# Patient Record
Sex: Male | Born: 1945 | Race: White | Hispanic: No | Marital: Married | Smoking: Former smoker
Health system: Southern US, Community
[De-identification: ages and names within clinical notes are randomized; demographics above are authoritative.]

## PROBLEM LIST (undated history)

## (undated) DIAGNOSIS — K8309 Other cholangitis: Secondary | ICD-10-CM

## (undated) DIAGNOSIS — I10 Essential (primary) hypertension: Secondary | ICD-10-CM

## (undated) DIAGNOSIS — D696 Thrombocytopenia, unspecified: Secondary | ICD-10-CM

## (undated) DIAGNOSIS — K8050 Calculus of bile duct without cholangitis or cholecystitis without obstruction: Secondary | ICD-10-CM

## (undated) DIAGNOSIS — I499 Cardiac arrhythmia, unspecified: Secondary | ICD-10-CM

## (undated) DIAGNOSIS — K2970 Gastritis, unspecified, without bleeding: Secondary | ICD-10-CM

## (undated) DIAGNOSIS — K269 Duodenal ulcer, unspecified as acute or chronic, without hemorrhage or perforation: Secondary | ICD-10-CM

## (undated) DIAGNOSIS — F1010 Alcohol abuse, uncomplicated: Secondary | ICD-10-CM

## (undated) DIAGNOSIS — I2510 Atherosclerotic heart disease of native coronary artery without angina pectoris: Secondary | ICD-10-CM

## (undated) DIAGNOSIS — E7800 Pure hypercholesterolemia, unspecified: Secondary | ICD-10-CM

## (undated) HISTORY — DX: Atherosclerotic heart disease of native coronary artery without angina pectoris: I25.10

## (undated) HISTORY — DX: Pure hypercholesterolemia, unspecified: E78.00

## (undated) HISTORY — DX: Gastritis, unspecified, without bleeding: K29.70

## (undated) HISTORY — DX: Alcohol abuse, uncomplicated: F10.10

## (undated) HISTORY — DX: Thrombocytopenia, unspecified: D69.6

## (undated) HISTORY — DX: Other cholangitis: K83.09

## (undated) HISTORY — DX: Duodenal ulcer, unspecified as acute or chronic, without hemorrhage or perforation: K26.9

## (undated) HISTORY — DX: Cardiac arrhythmia, unspecified: I49.9

## (undated) HISTORY — PX: TONSILLECTOMY: SUR1361

## (undated) HISTORY — PX: EYE SURGERY: SHX253

## (undated) HISTORY — PX: APPENDECTOMY: SHX54

## (undated) HISTORY — DX: Calculus of bile duct without cholangitis or cholecystitis without obstruction: K80.50

---

## 2005-04-09 ENCOUNTER — Ambulatory Visit (HOSPITAL_COMMUNITY): Admission: RE | Admit: 2005-04-09 | Discharge: 2005-04-09 | Payer: Self-pay | Admitting: Gastroenterology

## 2012-01-13 ENCOUNTER — Other Ambulatory Visit: Payer: Self-pay | Admitting: Gastroenterology

## 2013-01-06 ENCOUNTER — Emergency Department (HOSPITAL_COMMUNITY)
Admission: EM | Admit: 2013-01-06 | Discharge: 2013-01-06 | Disposition: A | Discharge status: Home / Self Care | Payer: BC Managed Care – PPO | Attending: Emergency Medicine | Admitting: Emergency Medicine

## 2013-01-06 ENCOUNTER — Encounter (HOSPITAL_COMMUNITY): Payer: Self-pay | Admitting: Emergency Medicine

## 2013-01-06 ENCOUNTER — Emergency Department (HOSPITAL_COMMUNITY): Payer: BC Managed Care – PPO

## 2013-01-06 DIAGNOSIS — Z87891 Personal history of nicotine dependence: Secondary | ICD-10-CM | POA: Insufficient documentation

## 2013-01-06 DIAGNOSIS — R55 Syncope and collapse: Secondary | ICD-10-CM | POA: Insufficient documentation

## 2013-01-06 DIAGNOSIS — R42 Dizziness and giddiness: Secondary | ICD-10-CM | POA: Insufficient documentation

## 2013-01-06 DIAGNOSIS — Z79899 Other long term (current) drug therapy: Secondary | ICD-10-CM | POA: Insufficient documentation

## 2013-01-06 DIAGNOSIS — R110 Nausea: Secondary | ICD-10-CM | POA: Insufficient documentation

## 2013-01-06 LAB — COMPREHENSIVE METABOLIC PANEL
AST: 15 U/L (ref 0–37)
Albumin: 3.8 g/dL (ref 3.5–5.2)
Calcium: 9.2 mg/dL (ref 8.4–10.5)
Creatinine, Ser: 1.01 mg/dL (ref 0.50–1.35)

## 2013-01-06 LAB — CBC
Hemoglobin: 15.3 g/dL (ref 13.0–17.0)
MCH: 32.1 pg (ref 26.0–34.0)
MCV: 94.7 fL (ref 78.0–100.0)
RBC: 4.76 MIL/uL (ref 4.22–5.81)
WBC: 6.5 10*3/uL (ref 4.0–10.5)

## 2013-01-06 LAB — GLUCOSE, CAPILLARY: Glucose-Capillary: 108 mg/dL — ABNORMAL HIGH (ref 70–99)

## 2013-01-06 NOTE — ED Notes (Addendum)
Pt here via ems was at MD office for f/u on eye Sx for cataract removal in his lt eye while sitting in the chair pt had a syncopal episode with drop in bp pt stated that he was disoriented after awaking he stated it went black and they used HN3 to wake him up

## 2013-01-06 NOTE — ED Provider Notes (Signed)
History     CSN: [id number]  Arrival date & time 01/06/13  0944   First MD Initiated Contact with Patient 01/06/13 1011      Chief Complaint  Patient presents with  . Loss of Consciousness    (Consider location/radiation/quality/duration/timing/severity/associated sxs/prior treatment) HPI The patient presents after a syncopal events.  Today, just prior to arrival the patient was at his ophthalmologist's for a postoperative visit.  Yesterday he had an elective repair of a cataract of his left eye.  Per his report the procedure was well tolerated, with no complications.  He did require additional anesthetic during the procedure. Today, at a followup visit, the patient had a period of lightheadedness, nausea, and subsequently lost consciousness.  Upon awakening he had no headache, chest pain, dyspnea.  He has full recall of the event upon awakening.  He denies any pre-syncope chest pain, dyspnea, headache.  On my examination he denies any of these complaints well.  He notes no similar prior events. History reviewed. No pertinent past medical history.  Past Surgical History  Procedure Laterality Date  . Eye surgery      History reviewed. No pertinent family history.  History  Substance Use Topics  . Smoking status: Former Games developer  . Smokeless tobacco: Not on file  . Alcohol Use: No      Review of Systems  Constitutional:       Per HPI, otherwise negative  HENT:       Per HPI, otherwise negative  Eyes:       HPI - no other new concerns  Respiratory:       Per HPI, otherwise negative  Cardiovascular:       Per HPI, otherwise negative  Gastrointestinal: Negative for vomiting.  Endocrine:       Negative aside from HPI  Genitourinary:       Neg aside from HPI   Musculoskeletal:       Per HPI, otherwise negative  Skin: Negative.   Neurological: Positive for syncope and light-headedness. Negative for dizziness, tremors, seizures, facial asymmetry, speech difficulty,  weakness, numbness and headaches.    Allergies  Review of patient's allergies indicates no known allergies.  Home Medications   Current Outpatient Rx  Name  Route  Sig  Dispense  Refill  . ciprofloxacin (CILOXAN) 0.3 % ophthalmic solution   Ophthalmic   Apply 1 drop to eye 4 (four) times daily. Instill 1 drop into surgery eye four times daily         . prednisoLONE acetate (PRED FORTE) 1 % ophthalmic suspension   Ophthalmic   Apply 1 drop to eye 4 (four) times daily. Instill into affected eye as directed for cataract surgery           BP 117/59  Pulse 63  Ht 5\' 11"  (1.803 m)  Wt 225 lb (102.059 kg)  BMI 31.39 kg/m2  SpO2 100%  Physical Exam  Nursing note and vitals reviewed. Constitutional: He is oriented to person, place, and time. He appears well-developed. No distress.  HENT:  Head: Normocephalic and atraumatic.  Nose: Nose normal.  Mouth/Throat: Mucous membranes are normal.  Eyes: Conjunctivae and EOM are normal.  Changes about the L eye c/w post-surgical effects are apparent - though no bleeding, no discharge, no hyphema  Cardiovascular: Normal rate and regular rhythm.   Pulmonary/Chest: Effort normal. No stridor. No respiratory distress.  Abdominal: He exhibits no distension.  Musculoskeletal: He exhibits no edema.  Neurological: He is alert and oriented  to person, place, and time. No cranial nerve deficit. He exhibits normal muscle tone. Coordination normal.  Skin: Skin is warm and dry.  Psychiatric: He has a normal mood and affect.    ED Course  Procedures (including critical care time)  Labs Reviewed  GLUCOSE, CAPILLARY - Abnormal; Notable for the following:    Glucose-Capillary 108 (*)    All other components within normal limits  CBC  COMPREHENSIVE METABOLIC PANEL  TROPONIN I   No results found.   No diagnosis found.  Cardiac 65 sinus rhythm normal Pulse ox 100% room air normal  Date: 01/06/2013  Rate: 66  Rhythm: normal sinus rhythm   QRS Axis: left  Intervals: normal  ST/T Wave abnormalities: nonspecific T wave changes  Conduction Disutrbances:none  Narrative Interpretation:   Old EKG Reviewed: none available BORDERLINE  12:13 PM On repeat exam the patient is using his eye pad, clearly in no distress.  The patient can ambulate, has no associated lightheadedness, or any other new concerns.  He and his wife were informed of all results.  They're also informed of the need for close followup. MDM  This patient, generally well, presents after a prodromal episode of syncope, with no associated chest pain, headache either before or after the event.  Given the patient's ongoing ophthalmologic procedures, and the patient just prior to today's event, there suspicion for vagal mediated syncope.  Absent distress, ECG changes, but abnormalities, there is low suspicion for ongoing coronary ischemia, dysrhythmia.  However, the patient will require additional outpatient evaluation.  This was endorsed to him and his wife.       Gerhard Munch, MD 01/06/13 1214

## 2014-12-21 DIAGNOSIS — K8020 Calculus of gallbladder without cholecystitis without obstruction: Secondary | ICD-10-CM | POA: Insufficient documentation

## 2014-12-21 DIAGNOSIS — I719 Aortic aneurysm of unspecified site, without rupture: Secondary | ICD-10-CM | POA: Insufficient documentation

## 2014-12-21 DIAGNOSIS — K760 Fatty (change of) liver, not elsewhere classified: Secondary | ICD-10-CM | POA: Insufficient documentation

## 2015-03-07 DIAGNOSIS — Z0000 Encounter for general adult medical examination without abnormal findings: Secondary | ICD-10-CM | POA: Insufficient documentation

## 2016-05-08 DIAGNOSIS — K5790 Diverticulosis of intestine, part unspecified, without perforation or abscess without bleeding: Secondary | ICD-10-CM | POA: Insufficient documentation

## 2016-05-08 DIAGNOSIS — K625 Hemorrhage of anus and rectum: Secondary | ICD-10-CM | POA: Insufficient documentation

## 2016-05-08 DIAGNOSIS — K635 Polyp of colon: Secondary | ICD-10-CM | POA: Insufficient documentation

## 2017-02-23 DIAGNOSIS — M722 Plantar fascial fibromatosis: Secondary | ICD-10-CM | POA: Insufficient documentation

## 2017-07-20 ENCOUNTER — Inpatient Hospital Stay (HOSPITAL_COMMUNITY)
Admission: EM | Admit: 2017-07-20 | Discharge: 2017-07-25 | DRG: 444 | Disposition: A | Discharge status: Home / Self Care | Payer: BC Managed Care – PPO | Attending: Internal Medicine | Admitting: Internal Medicine

## 2017-07-20 ENCOUNTER — Encounter (HOSPITAL_COMMUNITY): Payer: Self-pay | Admitting: Emergency Medicine

## 2017-07-20 ENCOUNTER — Encounter (HOSPITAL_COMMUNITY)
Admission: EM | Disposition: A | Discharge status: Home / Self Care | Payer: Self-pay | Source: Home / Self Care | Attending: Cardiovascular Disease

## 2017-07-20 DIAGNOSIS — F1010 Alcohol abuse, uncomplicated: Secondary | ICD-10-CM | POA: Diagnosis present

## 2017-07-20 DIAGNOSIS — K831 Obstruction of bile duct: Secondary | ICD-10-CM | POA: Diagnosis not present

## 2017-07-20 DIAGNOSIS — I10 Essential (primary) hypertension: Secondary | ICD-10-CM | POA: Diagnosis present

## 2017-07-20 DIAGNOSIS — K263 Acute duodenal ulcer without hemorrhage or perforation: Secondary | ICD-10-CM

## 2017-07-20 DIAGNOSIS — I248 Other forms of acute ischemic heart disease: Secondary | ICD-10-CM | POA: Diagnosis present

## 2017-07-20 DIAGNOSIS — I200 Unstable angina: Secondary | ICD-10-CM | POA: Diagnosis not present

## 2017-07-20 DIAGNOSIS — K219 Gastro-esophageal reflux disease without esophagitis: Secondary | ICD-10-CM | POA: Diagnosis present

## 2017-07-20 DIAGNOSIS — K859 Acute pancreatitis without necrosis or infection, unspecified: Secondary | ICD-10-CM | POA: Diagnosis present

## 2017-07-20 DIAGNOSIS — R7989 Other specified abnormal findings of blood chemistry: Secondary | ICD-10-CM

## 2017-07-20 DIAGNOSIS — I2510 Atherosclerotic heart disease of native coronary artery without angina pectoris: Secondary | ICD-10-CM | POA: Diagnosis not present

## 2017-07-20 DIAGNOSIS — R945 Abnormal results of liver function studies: Secondary | ICD-10-CM

## 2017-07-20 DIAGNOSIS — Z79899 Other long term (current) drug therapy: Secondary | ICD-10-CM

## 2017-07-20 DIAGNOSIS — K269 Duodenal ulcer, unspecified as acute or chronic, without hemorrhage or perforation: Secondary | ICD-10-CM | POA: Diagnosis present

## 2017-07-20 DIAGNOSIS — K2970 Gastritis, unspecified, without bleeding: Secondary | ICD-10-CM | POA: Diagnosis present

## 2017-07-20 DIAGNOSIS — E785 Hyperlipidemia, unspecified: Secondary | ICD-10-CM | POA: Diagnosis present

## 2017-07-20 DIAGNOSIS — I2121 ST elevation (STEMI) myocardial infarction involving left circumflex coronary artery: Secondary | ICD-10-CM | POA: Diagnosis present

## 2017-07-20 DIAGNOSIS — K8031 Calculus of bile duct with cholangitis, unspecified, with obstruction: Secondary | ICD-10-CM | POA: Diagnosis present

## 2017-07-20 DIAGNOSIS — R1013 Epigastric pain: Secondary | ICD-10-CM | POA: Diagnosis present

## 2017-07-20 DIAGNOSIS — K8070 Calculus of gallbladder and bile duct without cholecystitis without obstruction: Principal | ICD-10-CM | POA: Diagnosis present

## 2017-07-20 DIAGNOSIS — D696 Thrombocytopenia, unspecified: Secondary | ICD-10-CM | POA: Diagnosis present

## 2017-07-20 DIAGNOSIS — E860 Dehydration: Secondary | ICD-10-CM | POA: Diagnosis present

## 2017-07-20 DIAGNOSIS — G8929 Other chronic pain: Secondary | ICD-10-CM | POA: Diagnosis present

## 2017-07-20 DIAGNOSIS — Z87891 Personal history of nicotine dependence: Secondary | ICD-10-CM | POA: Diagnosis not present

## 2017-07-20 DIAGNOSIS — I447 Left bundle-branch block, unspecified: Secondary | ICD-10-CM | POA: Diagnosis present

## 2017-07-20 DIAGNOSIS — R509 Fever, unspecified: Secondary | ICD-10-CM | POA: Diagnosis not present

## 2017-07-20 DIAGNOSIS — R9431 Abnormal electrocardiogram [ECG] [EKG]: Secondary | ICD-10-CM

## 2017-07-20 DIAGNOSIS — I25110 Atherosclerotic heart disease of native coronary artery with unstable angina pectoris: Secondary | ICD-10-CM | POA: Diagnosis present

## 2017-07-20 DIAGNOSIS — B179 Acute viral hepatitis, unspecified: Secondary | ICD-10-CM | POA: Diagnosis present

## 2017-07-20 HISTORY — DX: Essential (primary) hypertension: I10

## 2017-07-20 HISTORY — PX: LEFT HEART CATH AND CORONARY ANGIOGRAPHY: CATH118249

## 2017-07-20 LAB — LIPID PANEL
CHOL/HDL RATIO: 2.8 ratio
CHOLESTEROL: 202 mg/dL — AB (ref 0–200)
HDL: 72 mg/dL (ref >40)
LDL Cholesterol: 114 mg/dL — ABNORMAL HIGH (ref 0–99)
Triglycerides: 80 mg/dL (ref <150)
VLDL: 16 mg/dL (ref 0–40)

## 2017-07-20 LAB — COMPREHENSIVE METABOLIC PANEL
ALBUMIN: 3.7 g/dL (ref 3.5–5.0)
ALT: 365 U/L — ABNORMAL HIGH (ref 17–63)
AST: 573 U/L — AB (ref 15–41)
Alkaline Phosphatase: 110 U/L (ref 38–126)
Anion gap: 11 (ref 5–15)
BUN: 12 mg/dL (ref 6–20)
CHLORIDE: 102 mmol/L (ref 101–111)
CO2: 21 mmol/L — ABNORMAL LOW (ref 22–32)
Calcium: 8.5 mg/dL — ABNORMAL LOW (ref 8.9–10.3)
Creatinine, Ser: 0.91 mg/dL (ref 0.61–1.24)
GFR calc Af Amer: >60 mL/min (ref >60)
Glucose, Bld: 158 mg/dL — ABNORMAL HIGH (ref 65–99)
POTASSIUM: 3.5 mmol/L (ref 3.5–5.1)
Sodium: 134 mmol/L — ABNORMAL LOW (ref 135–145)
Total Bilirubin: 3 mg/dL — ABNORMAL HIGH (ref 0.3–1.2)
Total Protein: 6.1 g/dL — ABNORMAL LOW (ref 6.5–8.1)

## 2017-07-20 LAB — CBC WITH DIFFERENTIAL/PLATELET
BASOS ABS: 0 10*3/uL (ref 0.0–0.1)
BASOS PCT: 0 %
Eosinophils Absolute: 0 10*3/uL (ref 0.0–0.7)
Eosinophils Relative: 0 %
HEMATOCRIT: 40.7 % (ref 39.0–52.0)
HEMOGLOBIN: 13.9 g/dL (ref 13.0–17.0)
Lymphocytes Relative: 9 %
Lymphs Abs: 0.6 10*3/uL — ABNORMAL LOW (ref 0.7–4.0)
MCH: 32.1 pg (ref 26.0–34.0)
MCHC: 34.2 g/dL (ref 30.0–36.0)
MCV: 94 fL (ref 78.0–100.0)
Monocytes Absolute: 0.1 10*3/uL (ref 0.1–1.0)
Monocytes Relative: 2 %
NEUTROS ABS: 6.3 10*3/uL (ref 1.7–7.7)
NEUTROS PCT: 89 %
Platelets: 145 10*3/uL — ABNORMAL LOW (ref 150–400)
RBC: 4.33 MIL/uL (ref 4.22–5.81)
RDW: 12.8 % (ref 11.5–15.5)
WBC: 7.1 10*3/uL (ref 4.0–10.5)

## 2017-07-20 LAB — PROTIME-INR
INR: 1
Prothrombin Time: 13.1 seconds (ref 11.4–15.2)

## 2017-07-20 LAB — TROPONIN I

## 2017-07-20 LAB — APTT: aPTT: 24 seconds (ref 24–36)

## 2017-07-20 LAB — LIPASE, BLOOD: LIPASE: 29 U/L (ref 11–51)

## 2017-07-20 SURGERY — LEFT HEART CATH AND CORONARY ANGIOGRAPHY
Anesthesia: LOCAL

## 2017-07-20 MED ORDER — ASPIRIN 81 MG PO CHEW
81.0000 mg | CHEWABLE_TABLET | Freq: Every day | ORAL | Status: DC
  Administered 2017-07-21: 81 mg via ORAL
  Filled 2017-07-20 (×2): qty 1

## 2017-07-20 MED ORDER — HEPARIN SODIUM (PORCINE) 5000 UNIT/ML IJ SOLN
4000.0000 [IU] | Freq: Once | INTRAMUSCULAR | Status: AC
  Administered 2017-07-20: 4000 [IU] via INTRAVENOUS

## 2017-07-20 MED ORDER — SODIUM CHLORIDE 0.9 % IV SOLN
250.0000 mL | INTRAVENOUS | Status: DC | PRN
Start: 2017-07-21 — End: 2017-07-23

## 2017-07-20 MED ORDER — CLOPIDOGREL BISULFATE 300 MG PO TABS
ORAL_TABLET | ORAL | Status: DC | PRN
  Administered 2017-07-20: 600 mg via ORAL

## 2017-07-20 MED ORDER — METOPROLOL TARTRATE 12.5 MG HALF TABLET
25.0000 mg | ORAL_TABLET | Freq: Two times a day (BID) | ORAL | Status: DC
  Administered 2017-07-20 – 2017-07-22 (×5): 25 mg via ORAL
  Filled 2017-07-20 (×5): qty 2

## 2017-07-20 MED ORDER — SODIUM CHLORIDE 0.9% FLUSH
3.0000 mL | Freq: Two times a day (BID) | INTRAVENOUS | Status: DC
  Administered 2017-07-21 – 2017-07-22 (×4): 3 mL via INTRAVENOUS
  Administered 2017-07-22: 10 mL via INTRAVENOUS
  Administered 2017-07-23: 3 mL via INTRAVENOUS

## 2017-07-20 MED ORDER — LIDOCAINE HCL (PF) 1 % IJ SOLN
INTRAMUSCULAR | Status: DC | PRN
Start: 2017-07-20 — End: 2017-07-20
  Administered 2017-07-20: 5 mL

## 2017-07-20 MED ORDER — SODIUM CHLORIDE 0.9 % IV SOLN: INTRAVENOUS | Status: DC

## 2017-07-20 MED ORDER — HEPARIN (PORCINE) IN NACL 2-0.9 UNIT/ML-% IJ SOLN
INTRAMUSCULAR | Status: AC
  Filled 2017-07-20: qty 1000

## 2017-07-20 MED ORDER — ACETAMINOPHEN 325 MG PO TABS: 650.0000 mg | ORAL_TABLET | ORAL | Status: DC | PRN

## 2017-07-20 MED ORDER — ONDANSETRON HCL 4 MG/2ML IJ SOLN
4.0000 mg | Freq: Four times a day (QID) | INTRAMUSCULAR | Status: DC | PRN
  Administered 2017-07-21: 4 mg via INTRAVENOUS
  Filled 2017-07-20: qty 2

## 2017-07-20 MED ORDER — ATORVASTATIN CALCIUM 80 MG PO TABS
80.0000 mg | ORAL_TABLET | Freq: Every day | ORAL | Status: DC
  Administered 2017-07-20: 80 mg via ORAL
  Filled 2017-07-20: qty 1

## 2017-07-20 MED ORDER — IOPAMIDOL (ISOVUE-370) INJECTION 76%
INTRAVENOUS | Status: DC | PRN
  Administered 2017-07-20: 100 mL via INTRAVENOUS

## 2017-07-20 MED ORDER — VERAPAMIL HCL 2.5 MG/ML IV SOLN
INTRAVENOUS | Status: AC
  Filled 2017-07-20: qty 2

## 2017-07-20 MED ORDER — ZOLPIDEM TARTRATE 5 MG PO TABS
5.0000 mg | ORAL_TABLET | Freq: Once | ORAL | Status: AC
  Administered 2017-07-20: 5 mg via ORAL
  Filled 2017-07-20: qty 1

## 2017-07-20 MED ORDER — IBUPROFEN 200 MG PO TABS
400.0000 mg | ORAL_TABLET | Freq: Four times a day (QID) | ORAL | Status: DC | PRN
  Administered 2017-07-21: 400 mg via ORAL
  Filled 2017-07-20: qty 2

## 2017-07-20 MED ORDER — IOPAMIDOL (ISOVUE-370) INJECTION 76%
INTRAVENOUS | Status: AC
  Filled 2017-07-20: qty 100

## 2017-07-20 MED ORDER — CLOPIDOGREL BISULFATE 75 MG PO TABS
75.0000 mg | ORAL_TABLET | Freq: Every day | ORAL | Status: DC
  Administered 2017-07-21: 75 mg via ORAL
  Filled 2017-07-20: qty 1

## 2017-07-20 MED ORDER — CLOPIDOGREL BISULFATE 300 MG PO TABS
ORAL_TABLET | ORAL | Status: AC
  Filled 2017-07-20: qty 2

## 2017-07-20 MED ORDER — SODIUM CHLORIDE 0.9% FLUSH
3.0000 mL | INTRAVENOUS | Status: DC | PRN
Start: 2017-07-21 — End: 2017-07-23

## 2017-07-20 MED ORDER — LIDOCAINE HCL 2 % IJ SOLN
INTRAMUSCULAR | Status: AC
  Filled 2017-07-20: qty 10

## 2017-07-20 MED ORDER — VERAPAMIL HCL 2.5 MG/ML IV SOLN
INTRAVENOUS | Status: DC | PRN
  Administered 2017-07-20: 21:00:00 via INTRA_ARTERIAL

## 2017-07-20 SURGICAL SUPPLY — 12 items
CATH 5FR JL3.5 JR4 ANG PIG MP (CATHETERS) ×1 IMPLANT
CATH VISTA GUIDE 6FR XBLAD3.5 (CATHETERS) ×1 IMPLANT
DEVICE RAD COMP TR BAND LRG (VASCULAR PRODUCTS) ×1 IMPLANT
GLIDESHEATH SLEND SS 6F .021 (SHEATH) ×1 IMPLANT
GUIDEWIRE INQWIRE 1.5J.035X260 (WIRE) IMPLANT
INQWIRE 1.5J .035X260CM (WIRE) ×2
KIT ENCORE 26 ADVANTAGE (KITS) ×1 IMPLANT
KIT HEART LEFT (KITS) ×2 IMPLANT
PACK CARDIAC CATHETERIZATION (CUSTOM PROCEDURE TRAY) ×2 IMPLANT
SYR MEDRAD MARK V 150ML (SYRINGE) ×1 IMPLANT
TRANSDUCER W/STOPCOCK (MISCELLANEOUS) ×2 IMPLANT
TUBING CIL FLEX 10 FLL-RA (TUBING) ×2 IMPLANT

## 2017-07-20 NOTE — Consult Note (Signed)
Reason for Consult: Abnormal LFTs. Referring Physician: Dr. Angelena Form.  Dustin Clayton is an 71 y.o. male.  HPI:71 year old male with no significant past medical history presented to the ER with complaints of epigastric pain. Patient's pain started today morning 2 hours after breakfast. Pain is mostly epigastric in area than aching nonradiating had associated nausea vomiting no diarrhea. Patient had a similar episode last week which was self-limited without any intervention. Today the pain was more worse. In the ER EKG was showing LBBB and since patient's condition was concerning for acute ST elevation MI patient was taken to cardiac catheter and as per the cardiologist cardiac Is unremarkable. Labs revealed patient has elevated LFTs and patient is also febrile with temperatures around 102F. Patient denies any recent travel sick contacts or any change in medications. Patient only takes some supplements like vitamin D coenzyme Q and at times takes Flexeril for back pain. Denies any Tylenol excess. On my exam patient's abdominal pain has improved and is appearing nontender at this time.  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  Past Medical History:  Diagnosis Date  . HTN (hypertension)     Past Surgical History:  Procedure Laterality Date  . APPENDECTOMY    . EYE SURGERY    . TONSILLECTOMY      Family History  Problem Relation Age of Onset  . Prostate cancer Brother   . CAD Neg Hx     Social History:  reports that he quit smoking about 32 years ago. He has never used smokeless tobacco. He reports that he drinks about 8.4 oz of alcohol per week . He reports that he does not use drugs.  Allergies: No Known Allergies  Medications: Reviewed.   Results for orders placed or performed during the hospital encounter of 07/20/17 (from the past 48 hour(s))  CBC  with Differential/Platelet     Status: Abnormal   Collection Time: 07/20/17  8:15 PM  Result Value Ref Range   WBC 7.1 4.0 - 10.5 K/uL   RBC 4.33 4.22 - 5.81 MIL/uL   Hemoglobin 13.9 13.0 - 17.0 g/dL   HCT 40.7 39.0 - 52.0 %   MCV 94.0 78.0 - 100.0 fL   MCH 32.1 26.0 - 34.0 pg   MCHC 34.2 30.0 - 36.0 g/dL   RDW 12.8 11.5 - 15.5 %   Platelets 145 (L) 150 - 400 K/uL   Neutrophils Relative % 89 %   Neutro Abs 6.3 1.7 - 7.7 K/uL   Lymphocytes Relative 9 %   Lymphs Abs 0.6 (L) 0.7 - 4.0 K/uL   Monocytes Relative 2 %   Monocytes Absolute 0.1 0.1 - 1.0 K/uL   Eosinophils Relative 0 %   Eosinophils Absolute 0.0 0.0 - 0.7 K/uL   Basophils Relative 0 %   Basophils Absolute 0.0 0.0 - 0.1 K/uL  Protime-INR     Status: None   Collection Time: 07/20/17  8:15 PM  Result Value Ref Range   Prothrombin Time 13.1 11.4 - 15.2 seconds   INR 1.00   APTT     Status: None   Collection Time: 07/20/17  8:15 PM  Result Value Ref Range   aPTT 24 24 - 36 seconds  Comprehensive metabolic panel     Status: Abnormal   Collection Time: 07/20/17  8:15 PM  Result Value Ref Range   Sodium 134 (L) 135 -  145 mmol/L   Potassium 3.5 3.5 - 5.1 mmol/L   Chloride 102 101 - 111 mmol/L   CO2 21 (L) 22 - 32 mmol/L   Glucose, Bld 158 (H) 65 - 99 mg/dL   BUN 12 6 - 20 mg/dL   Creatinine, Ser 0.91 0.61 - 1.24 mg/dL   Calcium 8.5 (L) 8.9 - 10.3 mg/dL   Total Protein 6.1 (L) 6.5 - 8.1 g/dL   Albumin 3.7 3.5 - 5.0 g/dL   AST 573 (H) 15 - 41 U/L   ALT 365 (H) 17 - 63 U/L   Alkaline Phosphatase 110 38 - 126 U/L   Total Bilirubin 3.0 (H) 0.3 - 1.2 mg/dL   GFR calc non Af Amer >60 >60 mL/min   GFR calc Af Amer >60 >60 mL/min    Comment: (NOTE) The eGFR has been calculated using the CKD EPI equation. This calculation has not been validated in all clinical situations. eGFR's persistently <60 mL/min signify possible Chronic Kidney Disease.    Anion gap 11 5 - 15  Troponin I     Status: None   Collection Time:  07/20/17  8:15 PM  Result Value Ref Range   Troponin I <0.03 <0.03 ng/mL  Lipid panel     Status: Abnormal   Collection Time: 07/20/17  8:15 PM  Result Value Ref Range   Cholesterol 202 (H) 0 - 200 mg/dL   Triglycerides 80 <150 mg/dL   HDL 72 >40 mg/dL   Total CHOL/HDL Ratio 2.8 RATIO   VLDL 16 0 - 40 mg/dL   LDL Cholesterol 114 (H) 0 - 99 mg/dL    Comment:        Total Cholesterol/HDL:CHD Risk Coronary Heart Disease Risk Table                     Men   Women  1/2 Average Risk   3.4   3.3  Average Risk       5.0   4.4  2 X Average Risk   9.6   7.1  3 X Average Risk  23.4   11.0        Use the calculated Patient Ratio above and the CHD Risk Table to determine the patient's CHD Risk.        ATP III CLASSIFICATION (LDL):  <100     mg/dL   Optimal  100-129  mg/dL   Near or Above                    Optimal  130-159  mg/dL   Borderline  160-189  mg/dL   High  >190     mg/dL   Very High   Lipase, blood     Status: None   Collection Time: 07/20/17  8:15 PM  Result Value Ref Range   Lipase 29 11 - 51 U/L    No results found.  Review of Systems  Constitutional: Positive for fever.  HENT: Negative.   Eyes: Negative.   Respiratory: Negative.   Cardiovascular: Negative.   Gastrointestinal: Positive for abdominal pain and vomiting.  Genitourinary: Negative.   Musculoskeletal: Negative.   Skin: Negative.   Neurological: Negative.   Endo/Heme/Allergies: Negative.   Psychiatric/Behavioral: Negative.    Blood pressure (!) 96/58, pulse 96, temperature (!) 102.5 F (39.2 C), temperature source Oral, resp. rate 10, height '5\' 11"'  (1.803 m), weight 101.7 kg (224 lb 3.3 oz), SpO2 95 %. Physical Exam  Constitutional: He is oriented  to person, place, and time. He appears well-developed and well-nourished.  HENT:  Head: Normocephalic and atraumatic.  Eyes: Pupils are equal, round, and reactive to light. Conjunctivae are normal.  Neck: Normal range of motion. Neck supple.   Cardiovascular: Normal rate and regular rhythm.   Respiratory: Effort normal and breath sounds normal. No respiratory distress. He has no wheezes.  GI: Soft. Bowel sounds are normal.  Musculoskeletal: Normal range of motion. He exhibits deformity. He exhibits no edema.  Neurological: He is alert and oriented to person, place, and time. No cranial nerve deficit. Coordination normal.  Skin: Skin is warm and dry.    Assessment/Plan -   #1. Abdominal pain with elevated LFTs and fever - given the history of gallstones primary concern is  for cholangitis/cholecystitis or any CBD stone. Since patient is febrile I have ordered blood cultures procalcitonin levels lactic acid levels and will also check acute hepatitis panel and recheck LFTs. Will get sonogram of abdomen to rule out cholecystitis and check CBD size. We'll get a CT scan of the abdomen and pelvis in the morning since patient just had cardiac cath with contrast. If procalcitonin's elevated or if patient is to remain febrile will keep patient on empiric antibiotics. In addition will check UA and chest x-ray. Since patient does not have any acute coronary event  I'm discontinuing patient's Lipitor and will also avoid Tylenol due to elevated LFTs. For fever I have placed patient on when necessary Motrin. #2. Abnormal EKG with normal cardiac cath. #3. Chronic back pain. #4. Thrombocytopenia - could be from alcohol intake or liver disease or acute infectious process. Any further worsening will be further workup. #5. Alcohol consumption - patient usually drinks of wine every night. We'll closely monitor for any withdrawals.  Thanks for involving [place] in patient's care we will follow along with you.  Rise Patience. 07/20/2017, 11:35 PM

## 2017-07-20 NOTE — ED Provider Notes (Signed)
[facility]-EMERGENCY DEPT Provider Note   CSN: [id number] Arrival date & time: 07/20/17  2008     History   Chief Complaint Chief Complaint  Patient presents with  . Code STEMI    HPI Dustin Clayton is a 71 y.o. male.  This is a 71 year old male who presents via EMS from home for a code STEMI.  Patient has been having sharp epigastric pain with nausea and vomiting for the past few days.  It worsened this evening and therefore EMS was called.  On arrival he was apparently very pale and there was concern for ST segment elevation in leads V1 through V3 with possible depression in leads V4 through V6.  His initial blood pressure on scene was 120/60, 324 mg aspirin given along with Zofran for nausea and one nitroglycerin.    The history is provided by the patient, the spouse and the EMS personnel. The history is limited by the condition of the patient.    Past Medical History:  Diagnosis Date  . HTN (hypertension)     There are no active problems to display for this patient.   Past Surgical History:  Procedure Laterality Date  . APPENDECTOMY    . EYE SURGERY         Home Medications    Prior to Admission medications   Medication Sig Start Date End Date Taking? Authorizing Provider  ciprofloxacin (CILOXAN) 0.3 % ophthalmic solution Apply 1 drop to eye 4 (four) times daily. Instill 1 drop into surgery eye four times daily    [provider]  prednisoLONE acetate (PRED FORTE) 1 % ophthalmic suspension Apply 1 drop to eye 4 (four) times daily. Instill into affected eye as directed for cataract surgery    [provider]    Family History History reviewed. No pertinent family history.  Social History Social History  Substance Use Topics  . Smoking status: Former Games developer  . Smokeless tobacco: Not on file  . Alcohol use No     Allergies   Patient has no known allergies.   Review of Systems Review of Systems  Unable to perform ROS: Acuity of condition      Physical Exam Updated Vital Signs Ht  (1.803 m)   Wt 102.1 kg (225 lb)   BMI 31.38 kg/m   Physical Exam  Constitutional: He is oriented to person, place, and time. He appears well-developed and well-nourished.  HENT:  Head: Normocephalic and atraumatic.  Eyes: Pupils are equal, round, and reactive to light. Conjunctivae are normal.  Neck: Normal range of motion. Neck supple.  Cardiovascular: Normal rate, regular rhythm, normal heart sounds and intact distal pulses.   No murmur heard. Pulses:      Radial pulses are 2+ on the right side, and 2+ on the left side.       Femoral pulses are 2+ on the right side, and 2+ on the left side.      Dorsalis pedis pulses are 2+ on the right side, and 2+ on the left side.  Pulmonary/Chest: Effort normal and breath sounds normal. No respiratory distress.  Abdominal: Soft. There is tenderness in the epigastric area.  Musculoskeletal: Normal range of motion. He exhibits no edema.  Neurological: He is alert and oriented to person, place, and time. He has normal strength. No sensory deficit.  Skin: Skin is warm and dry. Capillary refill takes less than 2 seconds. No rash noted. He is not diaphoretic. No erythema.  Psychiatric: He has a normal mood and  affect.  Nursing note and vitals reviewed.    ED Treatments / Results  Labs (all labs ordered are listed, but only abnormal results are displayed) Labs Reviewed  CBC WITH DIFFERENTIAL/PLATELET  PROTIME-INR  APTT  COMPREHENSIVE METABOLIC PANEL  TROPONIN I  LIPID PANEL  LIPASE, BLOOD    EKG  EKG Interpretation  Date/Time:  Monday July 20 2017 20:10:05 EDT Ventricular Rate:  112 PR Interval:    QRS Duration: 106 QT Interval:  331 QTC Calculation: 452 R Axis:   27 Text Interpretation:  Sinus tachycardia Repol abnrm suggests ischemia, lateral leads ST elevation, consider anterior injury code STEMI activated already Confirmed by Marily Memos [phone]) on 07/20/2017 8:17:05  PM       Radiology No results found.  Procedures Procedures (including critical care time)  Medications Ordered in ED Medications  heparin injection 4,000 Units (4,000 Units Intravenous Given 07/20/17 2017)     Initial Impression / Assessment and Plan / ED Course  I have reviewed the triage vital signs and the nursing notes.  Pertinent labs & imaging results that were available during my care of the patient were reviewed by me and considered in my medical decision making (see chart for details).     This is a 71 year old male who presents via EMS from home for a code STEMI.   On arrival patient's repeat blood pressure 119 systolic.  His oxygen saturations above 95% on room air.  He had continued epigastric pain Repeat 12-lead EKG performed demonstrates elevation in lead v1.  He has ST depression in V4 through V6 and T-wave inversion in aVL.  Cardiology presented at bedside for code STEMI.  Agreed with interpretation of EKG although not clear presentation, patient emergently taken to Cath Lab.  Heparin bolus begun.  Spouse at bedside and all questions answered.  Final Clinical Impressions(s) / ED Diagnoses   Final diagnoses:  ST elevation    New Prescriptions New Prescriptions   No medications on file     Shaune Pollack, MD 07/20/17 2021    Marily Memos, MD 07/21/17 [phone]

## 2017-07-20 NOTE — H&P (Addendum)
Patient ID: Dustin Clayton MRN: [id number] DOB/AGE: 1946/09/14 71 y.o. Admit date: 07/20/2017  Primary Care Physician: No primary care provider on file. Primary Cardiologist: New  HPI: 71 yo male with history of borderline HTN and former tobacco abuse presented to ED via [facility] with c/o epigastric pain, weakness, nausea, dyspnea. EKG with anterior ST elevation. Code STEMI called. Upon arrival, pt with minimal pain but ongoing diaphoresis and nausea. No prior cardiac issues. He takes no medications. Former smoker having stopped in 1986.   Review of systems complete and found to be negative unless listed above   Past Medical History:  Diagnosis Date  . HTN (hypertension)     Family History  Problem Relation Age of Onset  . Prostate cancer Brother   . CAD Neg Hx     Social History   Social History  . Marital status: Married    Spouse name: N/A  . Number of children: 1  . Years of education: N/A   Occupational History  . Not on file.   Social History Main Topics  . Smoking status: Former Smoker    Quit date: 11/03/1984  . Smokeless tobacco: Not on file  . Alcohol use 8.4 oz/week    14 Glasses of wine per week  . Drug use: No  . Sexual activity: Yes   Other Topics Concern  . Not on file   Social History Narrative  . No narrative on file    Past Surgical History:  Procedure Laterality Date  . APPENDECTOMY    . EYE SURGERY    . TONSILLECTOMY      No Known Allergies  Prior to Admission Meds:  Prior to Admission medications   Medication Sig Start Date End Date Taking? Authorizing Provider  ciprofloxacin (CILOXAN) 0.3 % ophthalmic solution Apply 1 drop to eye 4 (four) times daily. Instill 1 drop into surgery eye four times daily    [provider]  prednisoLONE acetate (PRED FORTE) 1 % ophthalmic suspension Apply 1 drop to eye 4 (four) times daily. Instill into affected eye as directed for cataract surgery    [provider]     Physical Exam: Blood pressure 124/68, pulse 95, temperature 99.6 F (37.6 C), temperature source Oral, resp. rate 12, height  (1.803 m), weight 225 lb (102.1 kg), SpO2 99%.    General: Well developed, well nourished, NAD  HEENT: OP clear, mucus membranes moist  SKIN: warm, dry. No rashes.  Neuro: No focal deficits  Musculoskeletal: Muscle strength 5/5 all ext  Psychiatric: Mood and affect normal  Neck: No JVD, no carotid bruits, no thyromegaly, no lymphadenopathy.  Lungs:Clear bilaterally, no wheezes, rhonci, crackles  Cardiovascular: tachy, regular. No murmurs, gallops or rubs.  Abdomen:Soft. Bowel sounds present. Non-tender.  Extremities: No lower extremity edema. Pulses are 2 + in the bilateral DP/PT.   Labs:  CMP Latest Ref Rng & Units 07/20/2017 01/06/2013  Glucose 65 - 99 mg/dL 409(W) 119(J)  BUN 6 - 20 mg/dL 12 15  Creatinine 4.78 - 1.24 mg/dL 2.95 6.21  Sodium 308 - 145 mmol/L 134(L) 134(L)  Potassium 3.5 - 5.1 mmol/L 3.5 3.9  Chloride 101 - 111 mmol/L 102 102  CO2 22 - 32 mmol/L 21(L) 20  Calcium 8.9 - 10.3 mg/dL 6.5(H) 9.2  Total Protein 6.5 - 8.1 g/dL 6.1(L) 6.9  Total Bilirubin 0.3 - 1.2 mg/dL 3.0(H) 0.5  Alkaline Phos 38 - 126 U/L 110 57  AST 15 - 41 U/L 573(H)  15  ALT 17 - 63 U/L 365(H) 15    CBC    Component Value Date/Time   WBC 7.1 07/20/2017 2015   RBC 4.33 07/20/2017 2015   HGB 13.9 07/20/2017 2015   HCT 40.7 07/20/2017 2015   PLT 145 (L) 07/20/2017 2015   MCV 94.0 07/20/2017 2015   MCH 32.1 07/20/2017 2015   MCHC 34.2 07/20/2017 2015   RDW 12.8 07/20/2017 2015   LYMPHSABS 0.6 (L) 07/20/2017 2015   MONOABS 0.1 07/20/2017 2015   EOSABS 0.0 07/20/2017 2015   BASOSABS 0.0 07/20/2017 2015    EKG: sinus, 1 mm ST elevation V1, V2. ST depression lateral leads.  ASSESSMENT AND PLAN:   1. Epigastric pain 2. Abnormal EKG  Epigastric pain in setting of ST elevation in precordial leads. Could not exclude acute MI on presentation. 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 Moderate disease in the large diagonal branch. No focal targets for PCI were obvious. First troponin negative.   LFTs are elevated. Total bilirubin is elevated. Will get IM consult. May need CT abdomen and pelvis. Currently having no epigastric pain. WBC count is normal. No report of fever at home but mildly febrile here.    Earney Hamburg, MD 07/20/2017, 9:44 PM

## 2017-07-20 NOTE — ED Triage Notes (Signed)
Pt brought to ED by [facility] from home for a code STEMI pt having sharp epigastric pain, with nausea and vomiting, very pale on ems arrival some elevation noticed on v1 and v2 and depression on lead I and II. 324 mg ASA given, 4 mg Zofran and one nitro sl by EMS.  BP 120/60 HR 100 SPO2 96% on 2L [place]

## 2017-07-21 ENCOUNTER — Inpatient Hospital Stay (HOSPITAL_COMMUNITY): Payer: BC Managed Care – PPO

## 2017-07-21 ENCOUNTER — Encounter (HOSPITAL_COMMUNITY): Payer: Self-pay | Admitting: Cardiovascular Disease

## 2017-07-21 DIAGNOSIS — I248 Other forms of acute ischemic heart disease: Secondary | ICD-10-CM

## 2017-07-21 DIAGNOSIS — I200 Unstable angina: Secondary | ICD-10-CM

## 2017-07-21 DIAGNOSIS — R945 Abnormal results of liver function studies: Secondary | ICD-10-CM

## 2017-07-21 DIAGNOSIS — B179 Acute viral hepatitis, unspecified: Secondary | ICD-10-CM

## 2017-07-21 DIAGNOSIS — I2121 ST elevation (STEMI) myocardial infarction involving left circumflex coronary artery: Secondary | ICD-10-CM

## 2017-07-21 DIAGNOSIS — R1013 Epigastric pain: Secondary | ICD-10-CM

## 2017-07-21 DIAGNOSIS — R7989 Other specified abnormal findings of blood chemistry: Secondary | ICD-10-CM

## 2017-07-21 DIAGNOSIS — I2510 Atherosclerotic heart disease of native coronary artery without angina pectoris: Secondary | ICD-10-CM

## 2017-07-21 LAB — HEPATIC FUNCTION PANEL
ALBUMIN: 3.1 g/dL — AB (ref 3.5–5.0)
ALT: 577 U/L — ABNORMAL HIGH (ref 17–63)
ALT: 752 U/L — ABNORMAL HIGH (ref 17–63)
AST: 714 U/L — ABNORMAL HIGH (ref 15–41)
AST: 744 U/L — AB (ref 15–41)
Albumin: 3.4 g/dL — ABNORMAL LOW (ref 3.5–5.0)
Alkaline Phosphatase: 101 U/L (ref 38–126)
Alkaline Phosphatase: 91 U/L (ref 38–126)
BILIRUBIN DIRECT: 2.4 mg/dL — AB (ref 0.1–0.5)
BILIRUBIN INDIRECT: 1.7 mg/dL — AB (ref 0.3–0.9)
Bilirubin, Direct: 2.4 mg/dL — ABNORMAL HIGH (ref 0.1–0.5)
Indirect Bilirubin: 1.8 mg/dL — ABNORMAL HIGH (ref 0.3–0.9)
TOTAL PROTEIN: 5.5 g/dL — AB (ref 6.5–8.1)
Total Bilirubin: 4.1 mg/dL — ABNORMAL HIGH (ref 0.3–1.2)
Total Bilirubin: 4.2 mg/dL — ABNORMAL HIGH (ref 0.3–1.2)
Total Protein: 5.7 g/dL — ABNORMAL LOW (ref 6.5–8.1)

## 2017-07-21 LAB — BASIC METABOLIC PANEL
ANION GAP: 9 (ref 5–15)
BUN: 13 mg/dL (ref 6–20)
CO2: 25 mmol/L (ref 22–32)
Calcium: 8.3 mg/dL — ABNORMAL LOW (ref 8.9–10.3)
Chloride: 100 mmol/L — ABNORMAL LOW (ref 101–111)
Creatinine, Ser: 1.2 mg/dL (ref 0.61–1.24)
GFR, EST NON AFRICAN AMERICAN: 58 mL/min — AB (ref >60)
Glucose, Bld: 131 mg/dL — ABNORMAL HIGH (ref 65–99)
POTASSIUM: 4 mmol/L (ref 3.5–5.1)
SODIUM: 134 mmol/L — AB (ref 135–145)

## 2017-07-21 LAB — ECHOCARDIOGRAM COMPLETE
HEIGHTINCHES: 71 in
WEIGHTICAEL: 3587.33 [oz_av]

## 2017-07-21 LAB — URINALYSIS, ROUTINE W REFLEX MICROSCOPIC
Glucose, UA: NEGATIVE mg/dL
KETONES UR: 5 mg/dL — AB
LEUKOCYTES UA: NEGATIVE
Nitrite: NEGATIVE
Protein, ur: 100 mg/dL — AB
Specific Gravity, Urine: 1.04 — ABNORMAL HIGH (ref 1.005–1.030)
pH: 7 (ref 5.0–8.0)

## 2017-07-21 LAB — CBC
HCT: 38.9 % — ABNORMAL LOW (ref 39.0–52.0)
HEMOGLOBIN: 13 g/dL (ref 13.0–17.0)
MCH: 31.7 pg (ref 26.0–34.0)
MCHC: 33.4 g/dL (ref 30.0–36.0)
MCV: 94.9 fL (ref 78.0–100.0)
PLATELETS: 109 10*3/uL — AB (ref 150–400)
RBC: 4.1 MIL/uL — AB (ref 4.22–5.81)
RDW: 13 % (ref 11.5–15.5)
WBC: 8.5 10*3/uL (ref 4.0–10.5)

## 2017-07-21 LAB — PROCALCITONIN: Procalcitonin: 2.25 ng/mL

## 2017-07-21 LAB — TROPONIN I

## 2017-07-21 LAB — LACTIC ACID, PLASMA: LACTIC ACID, VENOUS: 1.4 mmol/L (ref 0.5–1.9)

## 2017-07-21 LAB — MRSA PCR SCREENING: MRSA by PCR: NEGATIVE

## 2017-07-21 LAB — ACETAMINOPHEN LEVEL: Acetaminophen (Tylenol), Serum: <10 ug/mL — ABNORMAL LOW (ref 10–30)

## 2017-07-21 MED ORDER — VANCOMYCIN HCL 10 G IV SOLR
1250.0000 mg | Freq: Two times a day (BID) | INTRAVENOUS | Status: DC
  Administered 2017-07-21: 1250 mg via INTRAVENOUS
  Filled 2017-07-21: qty 1250

## 2017-07-21 MED ORDER — IOPAMIDOL (ISOVUE-300) INJECTION 61%
INTRAVENOUS | Status: AC
  Administered 2017-07-21: 100 mL
  Filled 2017-07-21: qty 100

## 2017-07-21 MED ORDER — LORAZEPAM 2 MG/ML IJ SOLN
1.0000 mg | Freq: Once | INTRAMUSCULAR | Status: AC | PRN
Start: 2017-07-21 — End: 2017-07-21
  Administered 2017-07-21: 1 mg via INTRAVENOUS
  Filled 2017-07-21: qty 1

## 2017-07-21 MED ORDER — PIPERACILLIN-TAZOBACTAM 3.375 G IVPB
3.3750 g | Freq: Three times a day (TID) | INTRAVENOUS | Status: DC
  Administered 2017-07-21 – 2017-07-24 (×12): 3.375 g via INTRAVENOUS
  Filled 2017-07-21 (×14): qty 50

## 2017-07-21 MED ORDER — SODIUM CHLORIDE 0.9 % IV SOLN
INTRAVENOUS | Status: DC
  Administered 2017-07-21 – 2017-07-24 (×8): via INTRAVENOUS

## 2017-07-21 NOTE — Progress Notes (Signed)
  Echocardiogram 2D Echocardiogram has been performed.  Dustin Clayton Nickey Kloepfer 07/21/2017, 11:26 AM

## 2017-07-21 NOTE — Progress Notes (Addendum)
Dr. Laural Benes text paged via amion to touch base regarding exams completed this morning. MD notified both abdominal ultrasound & CT abd/pelvis have resulted. Will continue to monitor.

## 2017-07-21 NOTE — Progress Notes (Signed)
Progress Note  Patient Name: Dustin Clayton Date of Encounter: 07/21/2017  Primary Cardiologist: New- Dr Clifton James  Subjective   Feels OK. No chest pain or dyspnea.  Inpatient Medications    Scheduled Meds: . aspirin  81 mg Oral Daily  . clopidogrel  75 mg Oral Q breakfast  . metoprolol tartrate  25 mg Oral BID  . sodium chloride flush  3 mL Intravenous Q12H   Continuous Infusions: . sodium chloride    . sodium chloride 150 mL/hr at 07/21/17 1554  . piperacillin-tazobactam (ZOSYN)  IV 3.375 g (07/21/17 1411)   PRN Meds: sodium chloride, ibuprofen, LORazepam, ondansetron (ZOFRAN) IV, sodium chloride flush   Vital Signs    Vitals:   07/21/17 1400 07/21/17 1500 07/21/17 1540 07/21/17 1606  BP: 116/73 128/64  137/81  Pulse: 64 64  71  Resp: Temp: 98.6 F (37 C)  98.5 F (36.9 C)   TempSrc: Oral  Oral   SpO2: 98% 98%  93%  Weight:      Height:        Intake/Output Summary (Last 24 hours) at 07/21/17 1651 Last data filed at 07/21/17 1553  Gross per 24 hour  Intake             1500 ml  Output              825 ml  Net              675 ml   Filed Weights   07/20/17 2009 07/20/17 2200  Weight: 225 lb (102.1 kg) 224 lb 3.3 oz (101.7 kg)    Telemetry    NSR- Personally Reviewed  ECG    NSR, WNL - Personally Reviewed  Physical Exam   GEN: No acute distress.   Neck: No JVD Cardiac: RRR, no murmurs, rubs, or gallops.  Respiratory: Clear to auscultation bilaterally. GI: Soft, nontender, non-distended  MS: No edema; No deformity. Neuro:  Nonfocal  Psych: Normal affect   Labs    Chemistry Recent Labs Lab 07/20/17 2015 07/21/17 0024 07/21/17 0508 07/21/17 1127  NA 134*  --  134*  --   K 3.5  --  4.0  --   CL 102  --  100*  --   CO2 21*  --  25  --   GLUCOSE 158*  --  131*  --   BUN 12  --  13  --   CREATININE 0.91  --  1.20  --   CALCIUM 8.5*  --  8.3*  --   PROT 6.1* 5.7*  --  5.5*  ALBUMIN 3.7 3.4*  --  3.1*  AST 573* 744*  --   714*  ALT 365* 577*  --  752*  ALKPHOS 110 101  --  91  BILITOT 3.0* 4.1*  --  4.2*  GFRNONAA >60  --  58*  --   GFRAA >60  --  >60  --   ANIONGAP 11  --  9  --      Hematology Recent Labs Lab 07/20/17 2015 07/21/17 0508  WBC 7.1 8.5  RBC 4.33 4.10*  HGB 13.9 13.0  HCT 40.7 38.9*  MCV 94.0 94.9  MCH 32.1 31.7  MCHC 34.2 33.4  RDW 12.8 13.0  PLT 145* 109*    Cardiac Enzymes Recent Labs Lab 07/20/17 2015 07/21/17 0508 07/21/17 1127  TROPONINI <0.03 <0.03 0.13*   No results for input(s): TROPIPOC in the last 168 hours.  BNPNo results for input(s): BNP, PROBNP in the last 168 hours.   DDimer No results for input(s): DDIMER in the last 168 hours.   Radiology    US Abdomen Complete  Result Date: 07/21/2017 CLINICAL DATA:  Elevated liver function tests. EXAM: ABDOMEN ULTRASOUND COMPLETE COMPARISON:  None. FINDINGS: Gallbladder: Multiple gallstones are noted with the largest measuring 1.3 cm. No significant gallbladder wall thickening or pericholecystic fluid is noted. Sludge is noted as well. No sonographic Murphy's sign is noted. Common bile duct: Diameter: 5.9 mm which is within normal limits. Liver: 1.1 cm simple cyst is noted in left hepatic lobe. Mildly increased echogenicity of hepatic parenchyma is noted suggesting fatty infiltration or hepatocellular disease. Portal vein is patent on color Doppler imaging with normal direction of blood flow towards the liver. IVC: No abnormality visualized. Pancreas: Visualized portion unremarkable. Spleen: Size and appearance within normal limits. Right Kidney: Length: 12.3 cm. Echogenicity within normal limits. No mass or hydronephrosis visualized. Left Kidney: Length: 13.1 cm. Echogenicity within normal limits. No mass or hydronephrosis visualized. Abdominal aorta: No aneurysm visualized. Other findings: None. IMPRESSION: Cholelithiasis without definite evidence of cholecystitis. Mildly increased echogenicity of hepatic parenchyma is  noted suggesting fatty infiltration or diffuse hepatocellular disease. Small left hepatic cyst is noted. Electronically Signed   By: Lupita Raider, M.D.   On: 07/21/2017 09:54   Ct Abdomen Pelvis W Contrast  Result Date: 07/21/2017 CLINICAL DATA:  Diffuse abdominal pain and distention for several days. Elevated liver function tests. Fever. EXAM: CT ABDOMEN AND PELVIS WITH CONTRAST TECHNIQUE: Multidetector CT imaging of the abdomen and pelvis was performed using the standard protocol following bolus administration of intravenous contrast. CONTRAST:  ISOVUE-300 IOPAMIDOL (ISOVUE-300) INJECTION 61% COMPARISON:  07/21/2017 abdominal sonogram. FINDINGS: Lower chest: Mild patchy tree-in-bud opacities in the dependent right lung base. Subsegmental atelectasis in the dependent lung bases. Hepatobiliary: Normal liver size. A few simple lateral segment left liver lobe cysts, largest 2.0 cm. Several subcentimeter hypodense lesions scattered throughout the liver, too small to characterize, for which no follow-up is required unless the patient has risk factors for liver malignancy. Nondistended gallbladder contains multiple calcified gallstones measuring up to 1.8 cm. No definite gallbladder wall thickening or pericholecystic fluid. No significant intrahepatic biliary ductal dilatation. Common bile duct diameter 6 mm, top-normal. There is a 4 mm calcified stone in the lower third of the common bile duct near the ampulla. There is mild haziness of the fat surrounding the common bile duct. Pancreas: Normal, with no mass or duct dilation. Spleen: Normal size. No mass. Adrenals/Urinary Tract: Normal adrenals. Normal kidneys with no hydronephrosis and no renal mass. Normal bladder. Stomach/Bowel: Small hiatal hernia. Otherwise nondistended and grossly normal stomach. Normal caliber small bowel with no small bowel wall thickening. Appendectomy. Moderate diffuse colonic diverticulosis, most prominent in the sigmoid colon,  with no large bowel wall thickening or pericolonic fat stranding. 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 Patent portal, splenic, hepatic and renal veins. No pathologically enlarged lymph nodes in the abdomen or pelvis. Reproductive: Normal size prostate with nonspecific internal prostatic calcifications. Other: No pneumoperitoneum, ascites or focal fluid collection. Musculoskeletal: No aggressive appearing focal osseous lesions. Moderate thoracolumbar spondylosis, most prominent at L5-S1. IMPRESSION: 1. Cholelithiasis.  No CT findings of acute cholecystitis. 2. Solitary 4 mm choledocholith in the lower third of the common bile duct near the ampulla. CBD diameter 6 mm, top-normal. No intrahepatic biliary ductal dilatation. 3. Haziness of the fat surrounding the common  bile duct, cannot exclude ascending cholangitis. 4. Mild patchy tree-in-bud opacities at the dependent right lung base, compatible with mild aspiration or infectious bronchiolitis. 5. 3.1 cm Abdominal Aortic Aneurysm (ICD10-I71.9). Recommend follow-up aortic ultrasound in 3 years. This recommendation follows ACR consensus guidelines: White Paper of the ACR Incidental Findings Committee II on Vascular Findings. J Am Coll Radiol 2013; 10:789-794. 6.  Aortic Atherosclerosis (ICD10-I70.0). 7. Small hiatal hernia. 8. Moderate diffuse colonic diverticulosis. Electronically Signed   By: Delbert Phenix M.D.   On: 07/21/2017 09:48   Dg Chest Port 1 View  Result Date: 07/21/2017 CLINICAL DATA:  Fevers EXAM: PORTABLE CHEST 1 VIEW COMPARISON:  01/06/2013 FINDINGS: The heart size and mediastinal contours are within normal limits. Both lungs are clear. The visualized skeletal structures are unremarkable. IMPRESSION: No active disease. Electronically Signed   By: Alcide Clever M.D.   On: 07/21/2017 07:47    Cardiac Studies   Echo: Study Conclusions  - Left ventricle: The cavity size was normal.  There was moderate   concentric hypertrophy. Systolic function was normal. The   estimated ejection fraction was in the range of 55% to 60%. Wall   motion was normal; there were no regional wall motion   abnormalities. Doppler parameters are consistent with abnormal   left ventricular relaxation (grade 1 diastolic dysfunction). - Left atrium: The atrium was mildly dilated. - Right ventricle: The cavity size was mildly dilated. - Atrial septum: No defect or patent foramen ovale was identified.  Procedures   LEFT HEART CATH AND CORONARY ANGIOGRAPHY  Conclusion     Mid Cx to Dist Cx lesion, 100 %stenosed.  Mid RCA lesion, 20 %stenosed.  Prox Cx lesion, 20 %stenosed.  Ost 1st Diag to 1st Diag lesion, 60 %stenosed.  Prox LAD lesion, 20 %stenosed.  The left ventricular systolic function is normal.  LV end diastolic pressure is normal.  The left ventricular ejection fraction is greater than 65% by visual estimate.  There is no mitral valve regurgitation.   1. There is right to left filling of a small lateral wall branch. This appears to be a total occlusion of the distal Circumflex where the vessel becomes very small in caliber but cannot totally exclude this being a small Diagonal branch. The distal AV groove Circumflex changes quickly to a small caliber vessel with a lateral branch. This could the site of total occlusion. The vascular territory supplied by this branch is very small.  2. Moderate stenosis in the ostium of the large first Diagonal branch with excellent flow down the vessel. This vessel represents a dual LAD. This lesion does not appear to be ulcerated and doe not appear to be flow limiting.  3. Mild stenosis in the proximal LAD. The mid LAD is tortuous and there is subtle dye staining in this bend with quick dye washout. The LAD continues to the apex.  4. The RCA is a large dominant vessel with mild plaque. The distal right gives off a collateral to a very small  lateral wall vessel.  5. Normal LV systolic function 6. 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 As above, LBBB induced with the placement of the pigtail catheter in the LV. Echo in am. It is unclear if his presentation is due to the occlusion of the small lateral wall branch. He is at the completion of the cath not having any symptoms.  Patient Profile     71 y.o. male presented with acute epigastric pain, weakness, nausea and fever. Ecg suggested septal ST elevation and code STEMI activated   Assessment & Plan    1. CAD. In reviewing serial Ecgs I am not impressed that there is any change compared to 2014. Troponin minimally elevated c/w demand ischemia. He does have occlusion of distal LCx but this appears chronic and has some collaterals. There is moderate stenosis at the origin of a large diagonal. Cath and Echo show vigorous LV function. At this point I am not convinced that his cardiac issues are the cause of his symptoms 2. Gallstones with elevated LFTs. ? CBD stone.  Will hold Plavix for now pending GI evaluation. I think his CV risk is low and he can tolerate whatever needs to be done from a GI standpoint.  3. HLD - will hold off on starting a statin given elevated LFTS.   For questions or updates, please contact [facility] Please consult [email or web address] for contact info under Cardiology/STEMI.      Signed, Peter [place], MD  07/21/2017, 4:51 PM

## 2017-07-21 NOTE — Progress Notes (Signed)
Visited with patient regard Advanced Directives.  Patient said he did not request Advanced Directives.  Patient stated he would like to take the paperwork with him and review it.  Paperwork left with patient.  Patient asked chaplain about the denomination she is affiliated with and proceeded to tell her that he was baptized in the [place].  Pleasant conversation.  Chaplain can follow up as needed.    07/21/17 1240  Clinical Encounter Type  Visited With Patient  Visit Type Initial;Psychological support;Spiritual support;Social support  Referral From Nurse;Physician  Consult/Referral To Chaplain

## 2017-07-21 NOTE — Progress Notes (Signed)
  Echocardiogram 2D Echocardiogram has been performed.  Dustin Clayton 07/21/2017, 11:25 AM

## 2017-07-21 NOTE — Progress Notes (Signed)
Pharmacy Antibiotic Note  Dustin Clayton is a 71 y.o. male admitted on 07/20/2017 with chest pain, s/p cath, now possible sepsis .  Pharmacy has been consulted for Vancomycin and Zosyn  dosing.  Plan: Vancomycin 1250 mg IV q12h Zosyn 3.375 g IV q8h   Height:  (180.3 cm) Weight: 224 lb 3.3 oz (101.7 kg) IBW/kg (Calculated) : 75.3  Temp (24hrs), Avg:100.1 F (37.8 C), Min:98.9 F (37.2 C), Max:102.5 F (39.2 C)   Recent Labs Lab 07/20/17 2015 07/21/17 0024  WBC 7.1  --   CREATININE 0.91  --   LATICACIDVEN  --  1.4    Estimated Creatinine Clearance: 87.8 mL/min (by C-G formula based on SCr of 0.91 mg/dL).    No Known Allergies   Dustin Clayton 07/21/2017 5:27 AM

## 2017-07-21 NOTE — Progress Notes (Signed)
Spoke to MRI tech regarding new order for MRCP. Per tech, OK for pt to eat now then be NPO for exam. Dr. Laural Benes notified and ok with above. Will continue to monitor.

## 2017-07-21 NOTE — Progress Notes (Signed)
TR band removed @ 0126. Level 0 to site, no oozing or hematoma noted. Pt educated on s/s of bleeding, instructed to inform nursing immediately if any bleeding occurs. Will continue to closely monitor.  Herma Ard, RN

## 2017-07-21 NOTE — Progress Notes (Signed)
PROGRESS NOTE  Dustin Clayton  ZOX:[id number]  DOB: 1946/07/18  DOA: 07/20/2017 PCP: No primary care provider on file.  Brief Admission Hx: 71 year old male with no significant past medical history presented to the ER with complaints of epigastric pain. Patient's pain started today morning 2 hours after breakfast. Pain is mostly epigastric in area than aching nonradiating had associated nausea vomiting no diarrhea. He was admitted for STEMI, taken to cath and it was noted that his LFTs were elevated with fever.  TRH was consulted for elevated LFTs and fever.     MDM/Assessment & Plan:   1. Elevated liver enzymes and fever - Pt has a benign abdominal exam.  He has hyperbilirubinemia and increasing LFTs.  He has had recent travel to [place], [place] and [place].  I'm concerned about acute hepatitis.  I'm ordering a CT abd/pelvis STAT.  Continue empiric zosyn, DC vanc.  US abdomen ordered to be done this morning.  Follow LFTs closely.  Recommend GI consult when studies done.  Hepatitis panel pending.  Daily CMP and CBC ordered.  Follow blood cultures.  Chest xray no acute findings.  Normal Lactic acid.  Hold tylenol.  Hold Statin.  2. Hyperbilirubinemia - Pt denies severe pruritus, continue IVF hydration.   3. Thrombocytopenia - Platelet count trending down, follow closely, did receive heparin for cath yesterday.  Daily CBCs.  4. Fever - no clear source of infection found, normal lactate and WBC, CXR normal.  Continue zosyn.  Follow cultures.  Fever possibly from acute hepatitis.  5. CAD - management per cardiology service.  6. Alcohol consumption- Monitor for withdrawal, denies being heavy drinker.   7. Chronic back pain - stable, follow clinically.   DVT prophylaxis: SCDs Code Status: FULL  Family Communication: patient  Disposition Plan: TBD  Subjective: Pt reports that he feels warm and dehydrated.  He is coughing up thick yellow sputum.  His urine is dark amber.   Objective: Vitals:   07/21/17 0400 07/21/17 0500 07/21/17 0600 07/21/17 0700  BP: (!) 102/56 (!) 120/56 130/63 127/69  Pulse: 81 82 93 87  Resp: Temp:      TempSrc:      SpO2: 93% 96% (!) 88% 95%  Weight:      Height:        Intake/Output Summary (Last 24 hours) at 07/21/17 0803 Last data filed at 07/21/17 0700  Gross per 24 hour  Intake             1150 ml  Output              225 ml  Net              925 ml   Filed Weights   07/20/17 2009 07/20/17 2200  Weight: 102.1 kg (225 lb) 101.7 kg (224 lb 3.3 oz)     REVIEW OF SYSTEMS  As per history otherwise all reviewed and reported negative  Exam:  General exam: awake, alert, NAD. Cooperative. Skin warm and clammy.  Respiratory system: Clear. No increased work of breathing. Cardiovascular system: S1 & S2 heard. No JVD, murmurs, gallops, clicks or pedal edema. Gastrointestinal system: Abdomen is nondistended, soft and nontender. Normal bowel sounds heard. Negative [employer].  Central nervous system: Alert and oriented. No focal neurological deficits. Extremities: no CCE.  Data Reviewed: Basic Metabolic Panel:  Recent Labs Lab 07/20/17 2015Collins [id number]  NA 134* 134*  K 3.5 4.0  CL 102 100*  CO2 21*  25  GLUCOSE 158* 131*  BUN 12 13  CREATININE 0.91 1.20  CALCIUM 8.5* 8.3*   Liver Function Tests:  Recent Labs Lab 07/20/17 2015 07/21/17 0024  AST 573* 744*  ALT 365* 577*  ALKPHOS 110 101  BILITOT 3.0* 4.1*  PROT 6.1* 5.7*  ALBUMIN 3.7 3.4*    Recent Labs Lab 07/20/17 2015  LIPASE 29   No results for input(s): AMMONIA in the last 168 hours. CBC:  Recent Labs Lab 07/20/17 2015 07/21/17 0508  WBC 7.1 8.5  NEUTROABS 6.3  --   HGB 13.9 13.0  HCT 40.7 38.9*  MCV 94.0 94.9  PLT 145* 109*   Cardiac Enzymes:  Recent Labs Lab 07/20/17 2015 07/21/17 0508  TROPONINI <0.03 <0.03   CBG (last 3)  No results for input(s): GLUCAP in the last 72 hours. Recent Results (from the past 240 hour(s))  MRSA  PCR Screening     Status: None   Collection Time: 07/20/17  9:52 PM  Result Value Ref Range Status   MRSA by PCR NEGATIVE NEGATIVE Final    Comment:        The GeneXpert MRSA Assay (FDA approved for NASAL specimens only), is one component of a comprehensive MRSA colonization surveillance program. It is not intended to diagnose MRSA infection nor to guide or monitor treatment for MRSA infections.      Studies: Dg Chest Port 1 View  Result Date: 07/21/2017 CLINICAL DATA:  Fevers EXAM: PORTABLE CHEST 1 VIEW COMPARISON:  01/06/2013 FINDINGS: The heart size and mediastinal contours are within normal limits. Both lungs are clear. The visualized skeletal structures are unremarkable. IMPRESSION: No active disease. Electronically Signed   By: Alcide Clever M.D.   On: 07/21/2017 07:47     Scheduled Meds: . iopamidol      . aspirin  81 mg Oral Daily  . clopidogrel  75 mg Oral Q breakfast  . metoprolol tartrate  25 mg Oral BID  . sodium chloride flush  3 mL Intravenous Q12H   Continuous Infusions: . sodium chloride    . sodium chloride 100 mL/hr at 07/21/17 0700  . piperacillin-tazobactam (ZOSYN)  IV 3.375 g (07/21/17 0600)    Active Problems:   Acute ST elevation myocardial infarction (STEMI) involving left circumflex coronary artery (HCC)   Unstable angina (HCC)   Acute ST elevation myocardial infarction (STEMI) involving left circumflex coronary artery without development of Q waves (HCC)   Acute hepatitis   Epigastric pain   Fever   Thrombocytopenia (HCC)  Time spent:   Standley Dakins, MD, FAAFP [facility] Pager [phone]  If 7PM-7AM, please contact night-coverage [email or web address] Password [email or web address] 07/21/2017, 8:03 AM    LOS: 1 day

## 2017-07-21 NOTE — Progress Notes (Addendum)
07/21/2017 2:25 PM  Results of CT scan and [place] reviewed.  Pt has a stone in biliary duct.  Likely has cholangitis.  MRCP ordered and GI consult requested Deboraha Sprang).  Continue IV antibiotics.  I spoke with GI.  Pt will need to be off plavix for 2 days before he can safely have an ERCP, will need to speak with cardiologist to see if we can hold plavix for 2 days for an ERCP.   Maryln Manuel MD

## 2017-07-21 NOTE — Consult Note (Signed)
Referring Provider:  Dr. Janee Morn  Primary Care Physician:  No primary care provider on file. Primary Gastroenterologist:   unassigned  Reason for Consultation:   Jaundice /CBD stone  HPI: Dustin Clayton is a 71 y.o. male  presented to the hospital yesterday with chest pain. He was found to have STEMI based on EKG findings. He underwent left heart cath yesterday which was unremarkable. Patient was also complaining of abdominal pain. He was found to have elevated LFTs. Subsequent CT scan showed possible common bile duct stone. GI is consulted for further evaluation.  Patient seen and examined at bedside. He said he'll was not doing well yesterday. He described epigastric discomfort which was dull in nature. He had nausea without any vomiting. Had subjective fever while at home. He also had similar symptoms around 1 month ago which resolved without any intervention. He had a fever of 101 this morning as well as 102.5 last night. He denied any blood in the stool or black stool. Denied diarrhea or constipation.  He is feeling better now. Afebrile at this time.  Colonoscopy colonoscopy was 1 year ago by Dr. Madilyn Fireman which showed diverticulosis according to patient.  Past Medical History:  Diagnosis Date  . HTN (hypertension)     Past Surgical History:  Procedure Laterality Date  . APPENDECTOMY    . EYE SURGERY    . LEFT HEART CATH AND CORONARY ANGIOGRAPHY N/A 07/20/2017   Procedure: LEFT HEART CATH AND CORONARY ANGIOGRAPHY;  Surgeon: Kathleene Hazel, MD;  Location: [facility];  Service: Cardiovascular;  Laterality: N/A;  . TONSILLECTOMY      Prior to Admission medications   Medication Sig Start Date End Date Taking? Authorizing Provider  aspirin EC 81 MG tablet Take 81 mg by mouth daily.   Yes [provider]  Cholecalciferol (VITAMIN D3 PO) Take 1 capsule by mouth daily.   Yes [provider]  Coenzyme Q10 (CO Q 10 PO) Take 1 capsule by mouth daily.   Yes  [provider]  cyclobenzaprine (FLEXERIL) 5 MG tablet Take 5 mg by mouth 3 (three) times daily as needed for muscle spasms.  05/20/16  Yes [provider]  Flaxseed, Linseed, (FLAX SEED OIL PO) Take 5 mLs by mouth daily.   Yes [provider]    Scheduled Meds: . aspirin  81 mg Oral Daily  . clopidogrel  75 mg Oral Q breakfast  . metoprolol tartrate  25 mg Oral BID  . sodium chloride flush  3 mL Intravenous Q12H   Continuous Infusions: . sodium chloride    . sodium chloride 150 mL/hr at 07/21/17 0845  . piperacillin-tazobactam (ZOSYN)  IV 3.375 g (07/21/17 1411)   PRN Meds:.sodium chloride, ibuprofen, LORazepam, ondansetron (ZOFRAN) IV, sodium chloride flush  Allergies as of 07/20/2017  . (No Known Allergies)    Family History  Problem Relation Age of Onset  . Prostate cancer Brother   . CAD Neg Hx     Social History   Social History  . Marital status: Married    Spouse name: N/A  . Number of children: 1  . Years of education: N/A   Occupational History  . Not on file.   Social History Main Topics  . Smoking status: Former Smoker    Quit date: 11/03/1984  . Smokeless tobacco: Never Used  . Alcohol use 8.4 oz/week    14 Glasses of wine per week  . Drug use: No  . Sexual activity: Yes  Other Topics Concern  . Not on file   Social History Narrative  . No narrative on file    Review of Systems: Review of Systems  Constitutional: Positive for chills, fever and malaise/fatigue.  HENT: Negative for hearing loss and tinnitus.   Eyes: Negative for blurred vision and double vision.  Respiratory: Negative for cough, hemoptysis and sputum production.   Cardiovascular: Positive for chest pain. Negative for palpitations and orthopnea.  Gastrointestinal: Positive for abdominal pain, nausea and vomiting. Negative for blood in stool, constipation, diarrhea, heartburn and melena.  Genitourinary: Negative for dysuria and urgency.   Musculoskeletal: Positive for neck pain. Negative for myalgias.  Skin: Negative for itching and rash.  Neurological: Negative for seizures and loss of consciousness.  Endo/Heme/Allergies: Does not bruise/bleed easily.  Psychiatric/Behavioral: Negative for hallucinations and suicidal ideas.    Physical Exam: Vital signs: Vitals:   07/21/17 1000 07/21/17 1205  BP: 130/70   Pulse: 81   Resp:    Temp:  99 F (37.2 C)  SpO2: 96%    Last BM Date:  (PTA) Physical Exam  Constitutional: He is oriented to person, place, and time. He appears well-developed and well-nourished. No distress.  HENT:  Head: Normocephalic and atraumatic.  Mouth/Throat: Oropharynx is clear and moist. No oropharyngeal exudate.  Eyes: EOM are normal. Scleral icterus is present.  Neck: Normal range of motion. Neck supple. No thyromegaly present.  Cardiovascular: Normal rate, regular rhythm and normal heart sounds.   Pulmonary/Chest: Effort normal and breath sounds normal. No respiratory distress.  Abdominal: Soft. Bowel sounds are normal. He exhibits no distension. There is tenderness. There is no rebound and no guarding.  Mild tenderness in epigastric area  Musculoskeletal: Normal range of motion. He exhibits no edema.  Neurological: He is alert and oriented to person, place, and time.  Skin: Skin is warm. No erythema.  Psychiatric: He has a normal mood and affect. His behavior is normal.    GI:  Lab Results:  Recent Labs  07/20/17 2015 07/21/17 0508  WBC 7.1 8.5  HGB 13.9 13.0  HCT 40.7 38.9*  PLT 145* 109*   BMET  Recent Labs  07/20/17 2015 07/21/17 0508  NA 134* 134*  K 3.5 4.0  CL 102 100*  CO2 21* 25  GLUCOSE 158* 131*  BUN 12 13  CREATININE 0.91 1.20  CALCIUM 8.5* 8.3*   LFT  Recent Labs  07/21/17 1127  PROT 5.5*  ALBUMIN 3.1*  AST 714*  ALT 752*  ALKPHOS 91  BILITOT 4.2*  BILIDIR 2.4*  IBILI 1.8*   PT/INR  Recent Labs  07/20/17 2015  LABPROT 13.1  INR 1.00      Studies/Results: US Abdomen Complete  Result Date: 07/21/2017 CLINICAL DATA:  Elevated liver function tests. EXAM: ABDOMEN ULTRASOUND COMPLETE COMPARISON:  None. FINDINGS: Gallbladder: Multiple gallstones are noted with the largest measuring 1.3 cm. No significant gallbladder wall thickening or pericholecystic fluid is noted. Sludge is noted as well. No sonographic Murphy's sign is noted. Common bile duct: Diameter: 5.9 mm which is within normal limits. Liver: 1.1 cm simple cyst is noted in left hepatic lobe. Mildly increased echogenicity of hepatic parenchyma is noted suggesting fatty infiltration or hepatocellular disease. Portal vein is patent on color Doppler imaging with normal direction of blood flow towards the liver. IVC: No abnormality visualized. Pancreas: Visualized portion unremarkable. Spleen: Size and appearance within normal limits. Right Kidney: Length: 12.3 cm. Echogenicity within normal limits. No mass or hydronephrosis visualized. Left Kidney: Length: 13.1  cm. Echogenicity within normal limits. No mass or hydronephrosis visualized. Abdominal aorta: No aneurysm visualized. Other findings: None. IMPRESSION: Cholelithiasis without definite evidence of cholecystitis. Mildly increased echogenicity of hepatic parenchyma is noted suggesting fatty infiltration or diffuse hepatocellular disease. Small left hepatic cyst is noted. Electronically Signed   By: Lupita Raider, M.D.   On: 07/21/2017 09:54   Ct Abdomen Pelvis W Contrast  Result Date: 07/21/2017 CLINICAL DATA:  Diffuse abdominal pain and distention for several days. Elevated liver function tests. Fever. EXAM: CT ABDOMEN AND PELVIS WITH CONTRAST TECHNIQUE: Multidetector CT imaging of the abdomen and pelvis was performed using the standard protocol following bolus administration of intravenous contrast. CONTRAST:  ISOVUE-300 IOPAMIDOL (ISOVUE-300) INJECTION 61% COMPARISON:  07/21/2017 abdominal sonogram. FINDINGS: Lower chest:  Mild patchy tree-in-bud opacities in the dependent right lung base. Subsegmental atelectasis in the dependent lung bases. Hepatobiliary: Normal liver size. A few simple lateral segment left liver lobe cysts, largest 2.0 cm. Several subcentimeter hypodense lesions scattered throughout the liver, too small to characterize, for which no follow-up is required unless the patient has risk factors for liver malignancy. Nondistended gallbladder contains multiple calcified gallstones measuring up to 1.8 cm. No definite gallbladder wall thickening or pericholecystic fluid. No significant intrahepatic biliary ductal dilatation. Common bile duct diameter 6 mm, top-normal. There is a 4 mm calcified stone in the lower third of the common bile duct near the ampulla. There is mild haziness of the fat surrounding the common bile duct. Pancreas: Normal, with no mass or duct dilation. Spleen: Normal size. No mass. Adrenals/Urinary Tract: Normal adrenals. Normal kidneys with no hydronephrosis and no renal mass. Normal bladder. Stomach/Bowel: Small hiatal hernia. Otherwise nondistended and grossly normal stomach. Normal caliber small bowel with no small bowel wall thickening. Appendectomy. Moderate diffuse colonic diverticulosis, most prominent in the sigmoid colon, with no large bowel wall thickening or pericolonic fat stranding. 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 Patent portal, splenic, hepatic and renal veins. No pathologically enlarged lymph nodes in the abdomen or pelvis. Reproductive: Normal size prostate with nonspecific internal prostatic calcifications. Other: No pneumoperitoneum, ascites or focal fluid collection. Musculoskeletal: No aggressive appearing focal osseous lesions. Moderate thoracolumbar spondylosis, most prominent at L5-S1. IMPRESSION: 1. Cholelithiasis.  No CT findings of acute cholecystitis. 2. Solitary 4 mm choledocholith in the lower third of  the common bile duct near the ampulla. CBD diameter 6 mm, top-normal. No intrahepatic biliary ductal dilatation. 3. Haziness of the fat surrounding the common bile duct, cannot exclude ascending cholangitis. 4. Mild patchy tree-in-bud opacities at the dependent right lung base, compatible with mild aspiration or infectious bronchiolitis. 5. 3.1 cm Abdominal Aortic Aneurysm (ICD10-I71.9). Recommend follow-up aortic ultrasound in 3 years. This recommendation follows ACR consensus guidelines: White Paper of the ACR Incidental Findings Committee II on Vascular Findings. J Am Coll Radiol 2013; 10:789-794. 6.  Aortic Atherosclerosis (ICD10-I70.0). 7. Small hiatal hernia. 8. Moderate diffuse colonic diverticulosis. Electronically Signed   By: Delbert Phenix M.D.   On: 07/21/2017 09:48   Dg Chest Port 1 View  Result Date: 07/21/2017 CLINICAL DATA:  Fevers EXAM: PORTABLE CHEST 1 VIEW COMPARISON:  01/06/2013 FINDINGS: The heart size and mediastinal contours are within normal limits. Both lungs are clear. The visualized skeletal structures are unremarkable. IMPRESSION: No active disease. Electronically Signed   By: Alcide Clever M.D.   On: 07/21/2017 07:47    Impression/Plan: - Abdominal pain, fever along with elevated LFTs. CT scan showing CBD of 6  mm with possible 4 mm common bile duct stone in the distal duct. - Elevated LFTs. Definitive diagnosis would be CBD stone versus hepatitis. - History of alcohol use - STEMI - Negative LHC. On Plavix Recommendation ------------------------ - Patient is currently on Plavix. Patient with recent STEMI but negative left heart cath. Agree with MRI-MRCP for further evaluation. Continue antibiotics for now. Hepatitis panel. - Discuss with primary team. He will need cardiology clearance for procedure. - Sphincterotomy for CBD stone removal during ERCP is a high-risk procedure for bleeding. If MRCP confirms CBD stone, we may do ERCP without sphincterotomy with  just placement of  plastic stent for biliary decompression .  - Monitor LFTs. GI will follow.    LOS: 1 day   Kathi Der  MD, FACP 07/21/2017, 2:50 PM  Pager [phone] If no answer or after 5 PM call [phone]

## 2017-07-22 DIAGNOSIS — R945 Abnormal results of liver function studies: Secondary | ICD-10-CM

## 2017-07-22 DIAGNOSIS — K8031 Calculus of bile duct with cholangitis, unspecified, with obstruction: Secondary | ICD-10-CM

## 2017-07-22 DIAGNOSIS — D696 Thrombocytopenia, unspecified: Secondary | ICD-10-CM

## 2017-07-22 DIAGNOSIS — R509 Fever, unspecified: Secondary | ICD-10-CM

## 2017-07-22 LAB — CBC
HEMATOCRIT: 38.6 % — AB (ref 39.0–52.0)
HEMOGLOBIN: 12.5 g/dL — AB (ref 13.0–17.0)
MCH: 31.3 pg (ref 26.0–34.0)
MCHC: 32.4 g/dL (ref 30.0–36.0)
MCV: 96.7 fL (ref 78.0–100.0)
Platelets: 60 10*3/uL — ABNORMAL LOW (ref 150–400)
RBC: 3.99 MIL/uL — ABNORMAL LOW (ref 4.22–5.81)
RDW: 13.4 % (ref 11.5–15.5)
WBC: 4.7 10*3/uL (ref 4.0–10.5)

## 2017-07-22 LAB — HEPATITIS PANEL, ACUTE
HEP B S AG: NEGATIVE
Hep A IgM: NEGATIVE
Hep B C IgM: NEGATIVE

## 2017-07-22 LAB — COMPREHENSIVE METABOLIC PANEL
ALBUMIN: 2.9 g/dL — AB (ref 3.5–5.0)
ALK PHOS: 88 U/L (ref 38–126)
ALT: 578 U/L — ABNORMAL HIGH (ref 17–63)
ANION GAP: 3 — AB (ref 5–15)
AST: 363 U/L — ABNORMAL HIGH (ref 15–41)
BILIRUBIN TOTAL: 3.7 mg/dL — AB (ref 0.3–1.2)
BUN: 10 mg/dL (ref 6–20)
CALCIUM: 8.2 mg/dL — AB (ref 8.9–10.3)
CO2: 29 mmol/L (ref 22–32)
CREATININE: 1.01 mg/dL (ref 0.61–1.24)
Chloride: 107 mmol/L (ref 101–111)
GFR calc Af Amer: >60 mL/min (ref >60)
GFR calc non Af Amer: >60 mL/min (ref >60)
GLUCOSE: 102 mg/dL — AB (ref 65–99)
Potassium: 3.6 mmol/L (ref 3.5–5.1)
Sodium: 139 mmol/L (ref 135–145)
TOTAL PROTEIN: 5.5 g/dL — AB (ref 6.5–8.1)

## 2017-07-22 LAB — PROCALCITONIN: PROCALCITONIN: 1.04 ng/mL

## 2017-07-22 LAB — TROPONIN I: Troponin I: 0.13 ng/mL (ref <0.03)

## 2017-07-22 MED ORDER — VITAMIN B-1 100 MG PO TABS
100.0000 mg | ORAL_TABLET | Freq: Every day | ORAL | Status: DC
  Administered 2017-07-22 – 2017-07-25 (×4): 100 mg via ORAL
  Filled 2017-07-22 (×4): qty 1

## 2017-07-22 MED ORDER — GADOBENATE DIMEGLUMINE 529 MG/ML IV SOLN
20.0000 mL | Freq: Once | INTRAVENOUS | Status: AC | PRN
  Administered 2017-07-22: 20 mL via INTRAVENOUS

## 2017-07-22 MED ORDER — LORAZEPAM 2 MG/ML IJ SOLN
1.0000 mg | Freq: Once | INTRAMUSCULAR | Status: AC
  Administered 2017-07-22: 1 mg via INTRAVENOUS
  Filled 2017-07-22: qty 1

## 2017-07-22 MED ORDER — FOLIC ACID 1 MG PO TABS
1.0000 mg | ORAL_TABLET | Freq: Every day | ORAL | Status: DC
  Administered 2017-07-22 – 2017-07-25 (×4): 1 mg via ORAL
  Filled 2017-07-22 (×4): qty 1

## 2017-07-22 NOTE — Care Management Note (Signed)
Case Management Note [name] RN, BSN Unit 4E-Case Manager-- 2H coverage [phone]  Patient Details  Name: Dustin Clayton MRN: [id number] Date of Birth: Mar 08, 1946  Subjective/Objective:  Pt admitted with chest pain- code STEMI activated however per Card not thought to be cardiac but more GI in nature- GI is following pt for MI-MRCP                  Action/Plan: PTA pt lived at home with wife- CM to follow for d/c needs.   Expected Discharge Date:                  Expected Discharge Plan:  Home/Self Care  In-House Referral:     Discharge planning Services  CM Consult  Post Acute Care Choice:    Choice offered to:     DME Arranged:    DME Agency:     HH Arranged:    HH Agency:     Status of Service:  In process, will continue to follow  If discussed at Long Length of Stay Meetings, dates discussed:    Discharge Disposition:   Additional Comments:  Darrold Span, RN 07/22/2017, 10:32 AM

## 2017-07-22 NOTE — Progress Notes (Signed)
PROGRESS NOTE  Dustin Clayton  ZOX:[id number]  DOB: 05/27/1946  DOA: 07/20/2017 PCP: No primary care provider on file.  Brief Admission Hx: 71 year old male with no significant past medical history presented to the ER with complaints of epigastric pain. Patient's pain started today morning 2 hours after breakfast. Pain is mostly epigastric in area than aching nonradiating had associated nausea vomiting no diarrhea. He was admitted for STEMI, taken to cath and it was noted that his LFTs were elevated with fever.  TRH was consulted for elevated LFTs and fever. Scenario presumed to be due to cholangitis and retained stone in CBD. GI on board and contemplating ERCP. Will follow rec's and continue assisting with patient care.      MDM/Assessment & Plan:   1. Elevated liver enzymes and fever - Patient has hyperbilirubinemia and increasing LFTs.  Work up suggesting cholangitis and retained stone in CBD. Continue zosyn. Follow GI rec's regarding need for ERCP. LFT's trending down. Continue avoiding use of tylenol and statins.  2. Hyperbilirubinemia - due to retain gallstone in CBD. GI on board and contemplating ERCP. Bilirubin and LFT's trending down. Will follow clinical response. 3. Thrombocytopenia - most likely associated with recent use of heparin, had also hx of alcohol abuse and  Acute infection. Avoid heparin products, follow platelets count. No active bleeding. Holding plavix and ASA. 4. Fever - presumed to be associated with cholangitis. No further fever appreciated. CXR normal, WBC's WNL. Continue zosyn. LFT's trending down.  5. Hx of CAD - no CP or SOB. Troponin except for one isolated result (mildly flat elevation) has been neg. Patient without EKG or telemetry changes for ischemia. Will continue medical management as per cardiology rec's. For now due to acute elevation on LFT's holding statins and in presence of low platelets/planned ERCP holding plavix and ASA.  6. Alcohol consumption- no  withdrawal appreciated. Cessation counseling provided. Continue thiamine and folic acid. 7. Chronic back pain - stable, and controlled. Continue PRN advil.  DVT prophylaxis: SCDs Code Status: FULL  Family Communication: patient's wife at bedside  Disposition Plan: home when medically stable. Waiting GI rec's for need on ERCP, continue IV antibiotics.  Subjective: Patient feeling better, in no acute distress and denying CP, abd pain, nausea, vomiting and SOB.  Objective: Vitals:   07/22/17 1800 07/22/17 1900 07/22/17 1940 07/22/17 2000  BP: (!) 165/87 (!) 141/119  (!) 147/80  Pulse: 74 78  72  Resp: Temp:   98.7 F (37.1 C)   TempSrc:   Oral   SpO2: 97% 99%  100%  Weight:      Height:        Intake/Output Summary (Last 24 hours) at 07/22/17 2107 Last data filed at 07/22/17 2032  Gross per 24 hour  Intake             2900 ml  Output              800 ml  Net             2100 ml   Filed Weights   07/20/17 2009 07/20/17 2200  Weight: 102.1 kg (225 lb) 101.7 kg (224 lb 3.3 oz)    Exam:  General exam: AAOX3, no CP, no nausea and no abd pain. No icterus.  Respiratory system: CTA bilaterally. Cardiovascular system: S1 and S2, no rubs, no gallops, no JVD Gastrointestinal system: NT, ND, positive BS; no guarding. Central nervous system: no focal deficit appreciated. CN grossly intact;  able to follow commands properly. Extremities: no edema, cyanosis or clubbing.  Data Reviewed: Basic Metabolic Panel:  Recent Labs Lab 07/20/17 2015 07/21/17 0508 07/22/17 0442  NA 134* 134* 139  K 3.5 4.0 3.6  CL 102 100* 107  CO2 21* 25 29  GLUCOSE 158* 131* 102*  BUN 12 13 10   CREATININE 0.91 1.20 1.01  CALCIUM 8.5* 8.3* 8.2*   Liver Function Tests:  Recent Labs Lab 07/20/17 2015 07/21/17 0024 07/21/17 1127 07/22/17 0442  AST 573* 744* 714* 363*  ALT 365* 577* 752* 578*  ALKPHOS 110 101 91 88  BILITOT 3.0* 4.1* 4.2* 3.7*  PROT 6.1* 5.7* 5.5* 5.5*  ALBUMIN  3.7 3.4* 3.1* 2.9*    Recent Labs Lab 07/20/17 2015  LIPASE 29   CBC:  Recent Labs Lab 07/20/17 2015 07/21/17 0508 07/22/17 0442  WBC 7.1 8.5 4.7  NEUTROABS 6.3  --   --   HGB 13.9 13.0 12.5*  HCT 40.7 38.9* 38.6*  MCV 94.0 94.9 96.7  PLT 145* 109* 60*   Cardiac Enzymes:  Recent Labs Lab 07/20/17 2015 07/21/17 0508 07/21/17 1127  TROPONINI <0.03 <0.03 0.13*   CBG (last 3)  No results for input(s): GLUCAP in the last 72 hours. Recent Results (from the past 240 hour(s))  MRSA PCR Screening     Status: None   Collection Time: 07/20/17  9:52 PM  Result Value Ref Range Status   MRSA by PCR NEGATIVE NEGATIVE Final    Comment:        The GeneXpert MRSA Assay (FDA approved for NASAL specimens only), is one component of a comprehensive MRSA colonization surveillance program. It is not intended to diagnose MRSA infection nor to guide or monitor treatment for MRSA infections.   Culture, blood (routine x 2)     Status: None (Preliminary result)   Collection Time: 07/21/17 12:24 AM  Result Value Ref Range Status   Specimen Description BLOOD LEFT HAND  Final   Special Requests   Final    BOTTLES DRAWN AEROBIC AND ANAEROBIC Blood Culture adequate volume   Culture NO GROWTH 1 DAY  Final   Report Status PENDING  Incomplete  Culture, blood (routine x 2)     Status: None (Preliminary result)   Collection Time: 07/21/17 12:32 AM  Result Value Ref Range Status   Specimen Description BLOOD LEFT HAND  Final   Special Requests   Final    BOTTLES DRAWN AEROBIC AND ANAEROBIC Blood Culture adequate volume   Culture NO GROWTH 1 DAY  Final   Report Status PENDING  Incomplete     Studies: US Abdomen Complete  Result Date: 07/21/2017 CLINICAL DATA:  Elevated liver function tests. EXAM: ABDOMEN ULTRASOUND COMPLETE COMPARISON:  None. FINDINGS: Gallbladder: Multiple gallstones are noted with the largest measuring 1.3 cm. No significant gallbladder wall thickening or  pericholecystic fluid is noted. Sludge is noted as well. No sonographic Murphy's sign is noted. Common bile duct: Diameter: 5.9 mm which is within normal limits. Liver: 1.1 cm simple cyst is noted in left hepatic lobe. Mildly increased echogenicity of hepatic parenchyma is noted suggesting fatty infiltration or hepatocellular disease. Portal vein is patent on color Doppler imaging with normal direction of blood flow towards the liver. IVC: No abnormality visualized. Pancreas: Visualized portion unremarkable. Spleen: Size and appearance within normal limits. Right Kidney: Length: 12.3 cm. Echogenicity within normal limits. No mass or hydronephrosis visualized. Left Kidney: Length: 13.1 cm. Echogenicity within normal limits. No mass or hydronephrosis visualized.  Abdominal aorta: No aneurysm visualized. Other findings: None. IMPRESSION: Cholelithiasis without definite evidence of cholecystitis. Mildly increased echogenicity of hepatic parenchyma is noted suggesting fatty infiltration or diffuse hepatocellular disease. Small left hepatic cyst is noted. Electronically Signed   By: Lupita Raider, M.D.   On: 07/21/2017 09:54   Ct Abdomen Pelvis W Contrast  Result Date: 07/21/2017 CLINICAL DATA:  Diffuse abdominal pain and distention for several days. Elevated liver function tests. Fever. EXAM: CT ABDOMEN AND PELVIS WITH CONTRAST TECHNIQUE: Multidetector CT imaging of the abdomen and pelvis was performed using the standard protocol following bolus administration of intravenous contrast. CONTRAST:  ISOVUE-300 IOPAMIDOL (ISOVUE-300) INJECTION 61% COMPARISON:  07/21/2017 abdominal sonogram. FINDINGS: Lower chest: Mild patchy tree-in-bud opacities in the dependent right lung base. Subsegmental atelectasis in the dependent lung bases. Hepatobiliary: Normal liver size. A few simple lateral segment left liver lobe cysts, largest 2.0 cm. Several subcentimeter hypodense lesions scattered throughout the liver, too small  to characterize, for which no follow-up is required unless the patient has risk factors for liver malignancy. Nondistended gallbladder contains multiple calcified gallstones measuring up to 1.8 cm. No definite gallbladder wall thickening or pericholecystic fluid. No significant intrahepatic biliary ductal dilatation. Common bile duct diameter 6 mm, top-normal. There is a 4 mm calcified stone in the lower third of the common bile duct near the ampulla. There is mild haziness of the fat surrounding the common bile duct. Pancreas: Normal, with no mass or duct dilation. Spleen: Normal size. No mass. Adrenals/Urinary Tract: Normal adrenals. Normal kidneys with no hydronephrosis and no renal mass. Normal bladder. Stomach/Bowel: Small hiatal hernia. Otherwise nondistended and grossly normal stomach. Normal caliber small bowel with no small bowel wall thickening. Appendectomy. Moderate diffuse colonic diverticulosis, most prominent in the sigmoid colon, with no large bowel wall thickening or pericolonic fat stranding. 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 Patent portal, splenic, hepatic and renal veins. No pathologically enlarged lymph nodes in the abdomen or pelvis. Reproductive: Normal size prostate with nonspecific internal prostatic calcifications. Other: No pneumoperitoneum, ascites or focal fluid collection. Musculoskeletal: No aggressive appearing focal osseous lesions. Moderate thoracolumbar spondylosis, most prominent at L5-S1. IMPRESSION: 1. Cholelithiasis.  No CT findings of acute cholecystitis. 2. Solitary 4 mm choledocholith in the lower third of the common bile duct near the ampulla. CBD diameter 6 mm, top-normal. No intrahepatic biliary ductal dilatation. 3. Haziness of the fat surrounding the common bile duct, cannot exclude ascending cholangitis. 4. Mild patchy tree-in-bud opacities at the dependent right lung base, compatible with mild  aspiration or infectious bronchiolitis. 5. 3.1 cm Abdominal Aortic Aneurysm (ICD10-I71.9). Recommend follow-up aortic ultrasound in 3 years. This recommendation follows ACR consensus guidelines: White Paper of the ACR Incidental Findings Committee II on Vascular Findings. J Am Coll Radiol 2013; 10:789-794. 6.  Aortic Atherosclerosis (ICD10-I70.0). 7. Small hiatal hernia. 8. Moderate diffuse colonic diverticulosis. Electronically Signed   By: Delbert Phenix M.D.   On: 07/21/2017 09:48   Mr 3d Recon At Scanner  Result Date: 07/22/2017 CLINICAL DATA:  Cholelithiasis with choledocholithiasis on CT. Abdominal pain, nausea. EXAM: MRI ABDOMEN WITHOUT AND WITH CONTRAST (INCLUDING MRCP) TECHNIQUE: Multiplanar multisequence MR imaging of the abdomen was performed both before and after the administration of intravenous contrast. Heavily T2-weighted images of the biliary and pancreatic ducts were obtained, and three-dimensional MRCP images were rendered by post processing. CONTRAST:  20mL MULTIHANCE GADOBENATE DIMEGLUMINE 529 MG/ML IV SOLN COMPARISON:  CT abdomen/pelvis dated 07/21/2017 FINDINGS: Motion degraded images.  Lower chest: Lung bases are essentially clear. Hepatobiliary: Scattered hepatic cysts, measuring up to 16 mm in the left hepatic lobe (series 7/ image 12). Layering gallstones (series 7/image 20), without associated inflammatory changes. No intrahepatic ductal dilatation.  No peribiliary enhancement. Common duct measures 7 mm, within normal limits. Associated 5 mm distal CBD stone above the ampulla (series 4/ image 21). Pancreas: Trace fluid along the pancreatic tail (series 6/ image 22). No pancreatic ductal dilatation, mass, or atrophy. No peripancreatic fluid collection/pseudocyst. Spleen:  Within normal limits. Adrenals/Urinary Tract:  Adrenal glands within normal limits. Kidneys are within normal limits.  No hydronephrosis. Stomach/Bowel: Stomach is notable for a tiny hiatal hernia. Visualized bowel is  notable for colonic diverticulosis. Vascular/Lymphatic:  No evidence of abdominal aortic aneurysm. No suspicious abdominal lymphadenopathy. Other:  No abdominal ascites. Musculoskeletal: No focal osseous lesions. IMPRESSION: Cholelithiasis, without associated inflammatory changes. Common duct measures 7 mm, within normal limits. Associated 5 mm distal CBD stone above the ampulla. Trace fluid along the pancreatic tail. Correlate with laboratory evaluation to assess for acute pancreatitis. No peripancreatic fluid collection/pseudocyst. Electronically Signed   By: Charline Bills M.D.   On: 07/22/2017 13:00   Dg Chest Port 1 View  Result Date: 07/21/2017 CLINICAL DATA:  Fevers EXAM: PORTABLE CHEST 1 VIEW COMPARISON:  01/06/2013 FINDINGS: The heart size and mediastinal contours are within normal limits. Both lungs are clear. The visualized skeletal structures are unremarkable. IMPRESSION: No active disease. Electronically Signed   By: Alcide Clever M.D.   On: 07/21/2017 07:47   Mr Abdomen Mrcp Vivien Rossetti Contast  Result Date: 07/22/2017 CLINICAL DATA:  Cholelithiasis with choledocholithiasis on CT. Abdominal pain, nausea. EXAM: MRI ABDOMEN WITHOUT AND WITH CONTRAST (INCLUDING MRCP) TECHNIQUE: Multiplanar multisequence MR imaging of the abdomen was performed both before and after the administration of intravenous contrast. Heavily T2-weighted images of the biliary and pancreatic ducts were obtained, and three-dimensional MRCP images were rendered by post processing. CONTRAST:  20mL MULTIHANCE GADOBENATE DIMEGLUMINE 529 MG/ML IV SOLN COMPARISON:  CT abdomen/pelvis dated 07/21/2017 FINDINGS: Motion degraded images. Lower chest: Lung bases are essentially clear. Hepatobiliary: Scattered hepatic cysts, measuring up to 16 mm in the left hepatic lobe (series 7/ image 12). Layering gallstones (series 7/image 20), without associated inflammatory changes. No intrahepatic ductal dilatation.  No peribiliary enhancement. Common  duct measures 7 mm, within normal limits. Associated 5 mm distal CBD stone above the ampulla (series 4/ image 21). Pancreas: Trace fluid along the pancreatic tail (series 6/ image 22). No pancreatic ductal dilatation, mass, or atrophy. No peripancreatic fluid collection/pseudocyst. Spleen:  Within normal limits. Adrenals/Urinary Tract:  Adrenal glands within normal limits. Kidneys are within normal limits.  No hydronephrosis. Stomach/Bowel: Stomach is notable for a tiny hiatal hernia. Visualized bowel is notable for colonic diverticulosis. Vascular/Lymphatic:  No evidence of abdominal aortic aneurysm. No suspicious abdominal lymphadenopathy. Other:  No abdominal ascites. Musculoskeletal: No focal osseous lesions. IMPRESSION: Cholelithiasis, without associated inflammatory changes. Common duct measures 7 mm, within normal limits. Associated 5 mm distal CBD stone above the ampulla. Trace fluid along the pancreatic tail. Correlate with laboratory evaluation to assess for acute pancreatitis. No peripancreatic fluid collection/pseudocyst. Electronically Signed   By: Charline Bills M.D.   On: 07/22/2017 13:00     Scheduled Meds: . metoprolol tartrate  25 mg Oral BID  . sodium chloride flush  3 mL Intravenous Q12H   Continuous Infusions: . sodium chloride    . sodium chloride 150 mL/hr at 07/22/17 1900  . piperacillin-tazobactam (  ZOSYN)  IV Stopped (07/22/17 1959)    Time spent: 25 minutes.   Vassie Loll, MD [facility] Pager [phone]  If 7PM-7AM, please contact night-coverage [email or web address] Password [email or web address] 07/22/2017, 9:07 PM    LOS: 2 days

## 2017-07-22 NOTE — Progress Notes (Signed)
[facility] Gastroenterology Progress Note  Dustin Clayton 71 y.o. June 26, 1946  CC:  Elevated LFTs, CBD stone   Subjective: Patient feeling better this morning. Denied abdominal pain, nausea or vomiting. Remains afebrile this morning  ROS : Chest pain resolved. Shortness of breath is improving.   Objective: Vital signs in last 24 hours: Vitals:   07/22/17 0700 07/22/17 0800  BP: 140/86 (!) 143/78  Pulse: 79 79  Resp: 13 14  Temp:  98.1 F (36.7 C)  SpO2: 94% 94%    Physical Exam:  General:  Alert, cooperative, no distress, appears stated age  Head:  Normocephalic, without obvious abnormality, atraumatic  Eyes:  Mild scleral icterus   Lungs:   Clear to auscultation bilaterally, respirations unlabored  Heart:  Regular rate and rhythm, S1, S2 normal  Abdomen:   Soft, non-tender, bowel sounds active all four quadrants,  no masses, No peritoneal signs   Extremities: Extremities normal, atraumatic, no  edema  Pulses: 2+ and symmetric    Lab Results:  Recent Labs  07/21/17 0508 07/22/17 0442  NA 134* 139  K 4.0 3.6  CL 100* 107  CO2 25 29  GLUCOSE 131* 102*  BUN 13 10  CREATININE 1.20 1.01  CALCIUM 8.3* 8.2*    Recent Labs  07/21/17 1127 07/22/17 0442  AST 714* 363*  ALT 752* 578*  ALKPHOS 91 88  BILITOT 4.2* 3.7*  PROT 5.5* 5.5*  ALBUMIN 3.1* 2.9*    Recent Labs  07/20/17 2015 07/21/17 0508 07/22/17 0442  WBC 7.1 8.5 4.7  NEUTROABS 6.3  --   --   HGB 13.9 13.0 12.5*  HCT 40.7 38.9* 38.6*  MCV 94.0 94.9 96.7  PLT 145* 109* 60*    Recent Labs  07/20/17 2015  LABPROT 13.1  INR 1.00      Assessment/Plan: - Abdominal pain, fever along with elevated LFTs. CT scan showing CBD of 6 mm with possible 4 mm common bile duct stone in the distal duct. - Elevated LFTs. Definitive diagnosis would be CBD stone versus hepatitis. - History of alcohol use - STEMI - Negative LHC. Plavix on hold. Last dose  yesterday.   Recommendation ------------------------ - Patient's abdominal pain is resolved now. He is afebrile this morning. LFTs are improving. - Follow MRI-MRCP findings. - ERCP only if MRCP shows CBD stones. - Appreciate cardiology input. Okay for procedure as well as to hold Plavix from cardiac standpoint. Last dose of Plavix was yesterday. - Sphincterotomy for CBD stone removal during ERCP is a high-risk procedure for bleeding. If MRCP confirms CBD stone, we may do ERCP without sphincterotomy with  just placement of plastic stent for biliary decompression .  - GI will follow   Kathi Der MD, FACP 07/22/2017, 9:30 AM  Pager [phone]  If no answer or after 5 PM call [phone]

## 2017-07-22 NOTE — Progress Notes (Signed)
Progress Note  Patient Name: Dustin Clayton Date of Encounter: 07/22/2017  Primary Cardiologist: New- Dr Clifton James  Subjective   Feels OK. No chest pain or dyspnea. Appetite is poor but denies abdominal pain currently.  Inpatient Medications    Scheduled Meds: . aspirin  81 mg Oral Daily  . metoprolol tartrate  25 mg Oral BID  . sodium chloride flush  3 mL Intravenous Q12H   Continuous Infusions: . sodium chloride    . sodium chloride 150 mL/hr at 07/22/17 0835  . piperacillin-tazobactam (ZOSYN)  IV 3.375 g (07/22/17 0618)   PRN Meds: sodium chloride, ibuprofen, ondansetron (ZOFRAN) IV, sodium chloride flush   Vital Signs    Vitals:   07/22/17 0400 07/22/17 0600 07/22/17 0700 07/22/17 0800  BP: 136/74 (!) 164/70 140/86 (!) 143/78  Pulse: 78 70 79 79  Resp: (!) Temp: (!) 97.5 F (36.4 C)   98.1 F (36.7 C)  TempSrc: Axillary   Oral  SpO2: 95% 97% 94% 94%  Weight:      Height:        Intake/Output Summary (Last 24 hours) at 07/22/17 1022 Last data filed at 07/22/17 0500  Gross per 24 hour  Intake           3672.5 ml  Output             1150 ml  Net           2522.5 ml   Filed Weights   07/20/17 2009 07/20/17 2200  Weight: 225 lb (102.1 kg) 224 lb 3.3 oz (101.7 kg)    Telemetry    NSR- Personally Reviewed  ECG    NSR, WNL - Personally Reviewed  Physical Exam   GEN: No acute distress.   Neck: No JVD Cardiac: RRR, no murmurs, rubs, or gallops.  Respiratory: Clear to auscultation bilaterally. GI: Soft, nontender, non-distended  MS: No edema; No deformity. Neuro:  Nonfocal  Psych: Normal affect   Labs    Chemistry  Recent Labs Lab 07/20/17 2015 07/21/17 0024 07/21/17 0508 07/21/17 1127 07/22/17 0442  NA 134*  --  134*  --  139  K 3.5  --  4.0  --  3.6  CL 102  --  100*  --  107  CO2 21*  --  25  --  29  GLUCOSE 158*  --  131*  --  102*  BUN 12  --  13  --  10  CREATININE 0.91  --  1.20  --  1.01  CALCIUM 8.5*  --  8.3*   --  8.2*  PROT 6.1* 5.7*  --  5.5* 5.5*  ALBUMIN 3.7 3.4*  --  3.1* 2.9*  AST 573* 744*  --  714* 363*  ALT 365* 577*  --  752* 578*  ALKPHOS 110 101  --  91 88  BILITOT 3.0* 4.1*  --  4.2* 3.7*  GFRNONAA >60  --  58*  --  >60  GFRAA >60  --  >60  --  >60  ANIONGAP 11  --  9  --  3*     Hematology  Recent Labs Lab 07/20/17 2015 07/21/17 0508 07/22/17 0442  WBC 7.1 8.5 4.7  RBC 4.33 4.10* 3.99*  HGB 13.9 13.0 12.5*  HCT 40.7 38.9* 38.6*  MCV 94.0 94.9 96.7  MCH 32.1 31.7 31.3  MCHC 34.2 33.4 32.4  RDW 12.8 13.0 13.4  PLT 145* 109* 60*  Cardiac Enzymes  Recent Labs Lab 07/20/17 2015 07/21/17 0508 07/21/17 1127  TROPONINI <0.03 <0.03 0.13*   No results for input(s): TROPIPOC in the last 168 hours.   BNPNo results for input(s): BNP, PROBNP in the last 168 hours.   DDimer No results for input(s): DDIMER in the last 168 hours.   Radiology    US Abdomen Complete  Result Date: 07/21/2017 CLINICAL DATA:  Elevated liver function tests. EXAM: ABDOMEN ULTRASOUND COMPLETE COMPARISON:  None. FINDINGS: Gallbladder: Multiple gallstones are noted with the largest measuring 1.3 cm. No significant gallbladder wall thickening or pericholecystic fluid is noted. Sludge is noted as well. No sonographic Murphy's sign is noted. Common bile duct: Diameter: 5.9 mm which is within normal limits. Liver: 1.1 cm simple cyst is noted in left hepatic lobe. Mildly increased echogenicity of hepatic parenchyma is noted suggesting fatty infiltration or hepatocellular disease. Portal vein is patent on color Doppler imaging with normal direction of blood flow towards the liver. IVC: No abnormality visualized. Pancreas: Visualized portion unremarkable. Spleen: Size and appearance within normal limits. Right Kidney: Length: 12.3 cm. Echogenicity within normal limits. No mass or hydronephrosis visualized. Left Kidney: Length: 13.1 cm. Echogenicity within normal limits. No mass or hydronephrosis visualized.  Abdominal aorta: No aneurysm visualized. Other findings: None. IMPRESSION: Cholelithiasis without definite evidence of cholecystitis. Mildly increased echogenicity of hepatic parenchyma is noted suggesting fatty infiltration or diffuse hepatocellular disease. Small left hepatic cyst is noted. Electronically Signed   By: Lupita Raider, M.D.   On: 07/21/2017 09:54   Ct Abdomen Pelvis W Contrast  Result Date: 07/21/2017 CLINICAL DATA:  Diffuse abdominal pain and distention for several days. Elevated liver function tests. Fever. EXAM: CT ABDOMEN AND PELVIS WITH CONTRAST TECHNIQUE: Multidetector CT imaging of the abdomen and pelvis was performed using the standard protocol following bolus administration of intravenous contrast. CONTRAST:  ISOVUE-300 IOPAMIDOL (ISOVUE-300) INJECTION 61% COMPARISON:  07/21/2017 abdominal sonogram. FINDINGS: Lower chest: Mild patchy tree-in-bud opacities in the dependent right lung base. Subsegmental atelectasis in the dependent lung bases. Hepatobiliary: Normal liver size. A few simple lateral segment left liver lobe cysts, largest 2.0 cm. Several subcentimeter hypodense lesions scattered throughout the liver, too small to characterize, for which no follow-up is required unless the patient has risk factors for liver malignancy. Nondistended gallbladder contains multiple calcified gallstones measuring up to 1.8 cm. No definite gallbladder wall thickening or pericholecystic fluid. No significant intrahepatic biliary ductal dilatation. Common bile duct diameter 6 mm, top-normal. There is a 4 mm calcified stone in the lower third of the common bile duct near the ampulla. There is mild haziness of the fat surrounding the common bile duct. Pancreas: Normal, with no mass or duct dilation. Spleen: Normal size. No mass. Adrenals/Urinary Tract: Normal adrenals. Normal kidneys with no hydronephrosis and no renal mass. Normal bladder. Stomach/Bowel: Small hiatal hernia. Otherwise  nondistended and grossly normal stomach. Normal caliber small bowel with no small bowel wall thickening. Appendectomy. Moderate diffuse colonic diverticulosis, most prominent in the sigmoid colon, with no large bowel wall thickening or pericolonic fat stranding. 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 Patent portal, splenic, hepatic and renal veins. No pathologically enlarged lymph nodes in the abdomen or pelvis. Reproductive: Normal size prostate with nonspecific internal prostatic calcifications. Other: No pneumoperitoneum, ascites or focal fluid collection. Musculoskeletal: No aggressive appearing focal osseous lesions. Moderate thoracolumbar spondylosis, most prominent at L5-S1. IMPRESSION: 1. Cholelithiasis.  No CT findings of acute cholecystitis. 2. Solitary 4 mm  choledocholith in the lower third of the common bile duct near the ampulla. CBD diameter 6 mm, top-normal. No intrahepatic biliary ductal dilatation. 3. Haziness of the fat surrounding the common bile duct, cannot exclude ascending cholangitis. 4. Mild patchy tree-in-bud opacities at the dependent right lung base, compatible with mild aspiration or infectious bronchiolitis. 5. 3.1 cm Abdominal Aortic Aneurysm (ICD10-I71.9). Recommend follow-up aortic ultrasound in 3 years. This recommendation follows ACR consensus guidelines: White Paper of the ACR Incidental Findings Committee II on Vascular Findings. J Am Coll Radiol 2013; 10:789-794. 6.  Aortic Atherosclerosis (ICD10-I70.0). 7. Small hiatal hernia. 8. Moderate diffuse colonic diverticulosis. Electronically Signed   By: Delbert Phenix M.D.   On: 07/21/2017 09:48   Dg Chest Port 1 View  Result Date: 07/21/2017 CLINICAL DATA:  Fevers EXAM: PORTABLE CHEST 1 VIEW COMPARISON:  01/06/2013 FINDINGS: The heart size and mediastinal contours are within normal limits. Both lungs are clear. The visualized skeletal structures are unremarkable.  IMPRESSION: No active disease. Electronically Signed   By: Alcide Clever M.D.   On: 07/21/2017 07:47    Cardiac Studies   Echo: Study Conclusions  - Left ventricle: The cavity size was normal. There was moderate   concentric hypertrophy. Systolic function was normal. The   estimated ejection fraction was in the range of 55% to 60%. Wall   motion was normal; there were no regional wall motion   abnormalities. Doppler parameters are consistent with abnormal   left ventricular relaxation (grade 1 diastolic dysfunction). - Left atrium: The atrium was mildly dilated. - Right ventricle: The cavity size was mildly dilated. - Atrial septum: No defect or patent foramen ovale was identified.  Procedures   LEFT HEART CATH AND CORONARY ANGIOGRAPHY  Conclusion     Mid Cx to Dist Cx lesion, 100 %stenosed.  Mid RCA lesion, 20 %stenosed.  Prox Cx lesion, 20 %stenosed.  Ost 1st Diag to 1st Diag lesion, 60 %stenosed.  Prox LAD lesion, 20 %stenosed.  The left ventricular systolic function is normal.  LV end diastolic pressure is normal.  The left ventricular ejection fraction is greater than 65% by visual estimate.  There is no mitral valve regurgitation.   1. There is right to left filling of a small lateral wall branch. This appears to be a total occlusion of the distal Circumflex where the vessel becomes very small in caliber but cannot totally exclude this being a small Diagonal branch. The distal AV groove Circumflex changes quickly to a small caliber vessel with a lateral branch. This could the site of total occlusion. The vascular territory supplied by this branch is very small.  2. Moderate stenosis in the ostium of the large first Diagonal branch with excellent flow down the vessel. This vessel represents a dual LAD. This lesion does not appear to be ulcerated and doe not appear to be flow limiting.  3. Mild stenosis in the proximal LAD. The mid LAD is tortuous and there is subtle  dye staining in this bend with quick dye washout. The LAD continues to the apex.  4. The RCA is a large dominant vessel with mild plaque. The distal right gives off a collateral to a very small lateral wall vessel.  5. Normal LV systolic function 6. 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 As above, LBBB induced with the placement of the pigtail catheter in the LV. Echo in am. It is unclear if  his presentation is due to the occlusion of the small lateral wall branch. He is at the completion of the cath not having any symptoms.       Patient Profile     71 y.o. male presented with acute epigastric pain, weakness, nausea and fever. Ecg suggested septal ST elevation and code STEMI activated   Assessment & Plan    1. CAD. In reviewing serial Ecgs I am not impressed that there is any change compared to 2014. Troponin minimally elevated c/w demand ischemia. He does have occlusion of distal LCx but this appears chronic and has some collaterals. There is moderate stenosis at the origin of a large diagonal. Cath and Echo show vigorous LV function. At this point I am not convinced that his cardiac issues are the cause of his symptoms 2. Gallstones with elevated LFTs. ? CBD stone.   Plavix discontinued. I think his CV risk is low and he can tolerate whatever needs to be done from a GI standpoint.  3. HLD - will hold off on starting a statin given elevated LFTS.  4. Thrombocytopenia. Patient has significant decline in platelets since admission 145K>>60K. Given this I would also stop ASA. Unclear why platelets are dropping. Will defer to primary team.  For questions or updates, please contact [facility] Please consult [email or web address] for contact info under Cardiology/STEMI.      Signed, Peter [place], MD  07/22/2017, 10:22 AM

## 2017-07-23 DIAGNOSIS — K8031 Calculus of bile duct with cholangitis, unspecified, with obstruction: Secondary | ICD-10-CM

## 2017-07-23 LAB — CBC
HCT: 39 % (ref 39.0–52.0)
Hemoglobin: 12.6 g/dL — ABNORMAL LOW (ref 13.0–17.0)
MCH: 31.2 pg (ref 26.0–34.0)
MCHC: 32.3 g/dL (ref 30.0–36.0)
MCV: 96.5 fL (ref 78.0–100.0)
PLATELETS: 60 10*3/uL — AB (ref 150–400)
RBC: 4.04 MIL/uL — ABNORMAL LOW (ref 4.22–5.81)
RDW: 13.3 % (ref 11.5–15.5)
WBC: 5.1 10*3/uL (ref 4.0–10.5)

## 2017-07-23 LAB — COMPREHENSIVE METABOLIC PANEL
ALBUMIN: 2.8 g/dL — AB (ref 3.5–5.0)
ALT: 399 U/L — ABNORMAL HIGH (ref 17–63)
ANION GAP: 4 — AB (ref 5–15)
AST: 186 U/L — AB (ref 15–41)
Alkaline Phosphatase: 128 U/L — ABNORMAL HIGH (ref 38–126)
BILIRUBIN TOTAL: 4 mg/dL — AB (ref 0.3–1.2)
BUN: 7 mg/dL (ref 6–20)
CHLORIDE: 107 mmol/L (ref 101–111)
CO2: 26 mmol/L (ref 22–32)
Calcium: 8.1 mg/dL — ABNORMAL LOW (ref 8.9–10.3)
Creatinine, Ser: 0.9 mg/dL (ref 0.61–1.24)
GFR calc Af Amer: >60 mL/min (ref >60)
GFR calc non Af Amer: >60 mL/min (ref >60)
GLUCOSE: 95 mg/dL (ref 65–99)
POTASSIUM: 4 mmol/L (ref 3.5–5.1)
SODIUM: 137 mmol/L (ref 135–145)
TOTAL PROTEIN: 5.4 g/dL — AB (ref 6.5–8.1)

## 2017-07-23 LAB — HEPATITIS PANEL, ACUTE
HCV Ab: <0.1 s/co ratio (ref 0.0–0.9)
HEP B C IGM: NEGATIVE
Hep A IgM: NEGATIVE
Hepatitis B Surface Ag: NEGATIVE

## 2017-07-23 MED ORDER — METOPROLOL TARTRATE 50 MG PO TABS
50.0000 mg | ORAL_TABLET | Freq: Two times a day (BID) | ORAL | Status: DC
Start: 2017-07-23 — End: 2017-07-25
  Administered 2017-07-23 – 2017-07-25 (×5): 50 mg via ORAL
  Filled 2017-07-23 (×5): qty 1

## 2017-07-23 MED ORDER — HYDRALAZINE HCL 20 MG/ML IJ SOLN
10.0000 mg | Freq: Three times a day (TID) | INTRAMUSCULAR | Status: DC | PRN
  Administered 2017-07-23: 10 mg via INTRAVENOUS
  Filled 2017-07-23: qty 1

## 2017-07-23 MED ORDER — AMLODIPINE BESYLATE 5 MG PO TABS
5.0000 mg | ORAL_TABLET | Freq: Every day | ORAL | Status: DC
  Administered 2017-07-23 – 2017-07-25 (×3): 5 mg via ORAL
  Filled 2017-07-23 (×3): qty 1

## 2017-07-23 NOTE — Progress Notes (Signed)
Progress Note  Patient Name: Dustin Clayton Date of Encounter: 07/23/2017  Primary Cardiologist: New- Dr Clifton James  Subjective   Feels OK. No chest pain or dyspnea. Denies abdominal pain currently.  Inpatient Medications    Scheduled Meds: . folic acid  1 mg Oral Daily  . metoprolol tartrate  50 mg Oral BID  . sodium chloride flush  3 mL Intravenous Q12H  . thiamine  100 mg Oral Daily   Continuous Infusions: . sodium chloride    . sodium chloride 150 mL/hr at 07/23/17 0700  . piperacillin-tazobactam (ZOSYN)  IV 3.375 g (07/23/17 0513)   PRN Meds: sodium chloride, ibuprofen, ondansetron (ZOFRAN) IV, sodium chloride flush   Vital Signs    Vitals:   07/23/17 0400 07/23/17 0500 07/23/17 0600 07/23/17 0700  BP: (!) 164/96 (!) 159/84 (!) 159/87 (!) 163/88  Pulse: 64 64 66 73  Resp: Temp:      TempSrc:      SpO2: 100% 100% 96% 98%  Weight:      Height:        Intake/Output Summary (Last 24 hours) at 07/23/17 0823 Last data filed at 07/23/17 0800  Gross per 24 hour  Intake             3300 ml  Output             1940 ml  Net             1360 ml   Filed Weights   07/20/17 2009 07/20/17 2200  Weight: 225 lb (102.1 kg) 224 lb 3.3 oz (101.7 kg)    Telemetry    NSR- Personally Reviewed  ECG    NSR, WNL - Personally Reviewed  Physical Exam   GEN: No acute distress.   Neck: No JVD Cardiac: RRR, no murmurs, rubs, or gallops.  Respiratory: Clear to auscultation bilaterally. GI: Soft, nontender, non-distended  MS: No edema; No deformity. Neuro:  Nonfocal  Psych: Normal affect   Labs    Chemistry  Recent Labs Lab 07/21/17 0508 07/21/17 1127 07/22/17 0442 07/23/17 0246  NA 134*  --  139 137  K 4.0  --  3.6 4.0  CL 100*  --  107 107  CO2 25  --  29 26  GLUCOSE 131*  --  102* 95  BUN 13  --  10 7  CREATININE 1.20  --  1.01 0.90  CALCIUM 8.3*  --  8.2* 8.1*  PROT  --  5.5* 5.5* 5.4*  ALBUMIN  --  3.1* 2.9* 2.8*  AST  --  714* 363*  186*  ALT  --  752* 578* 399*  ALKPHOS  --  91 88 128*  BILITOT  --  4.2* 3.7* 4.0*  GFRNONAA 58*  --  >60 >60  GFRAA >60  --  >60 >60  ANIONGAP 9  --  3* 4*     Hematology  Recent Labs Lab 07/21/17 0508 07/22/17 0442 07/23/17 0246  WBC 8.5 4.7 5.1  RBC 4.10* 3.99* 4.04*  HGB 13.0 12.5* 12.6*  HCT 38.9* 38.6* 39.0  MCV 94.9 96.7 96.5  MCH 31.7 31.3 31.2  MCHC 33.4 32.4 32.3  RDW 13.0 13.4 13.3  PLT 109* 60* 60*    Cardiac Enzymes  Recent Labs Lab 07/20/17 2015 07/21/17 0508 07/21/17 1127  TROPONINI <0.03 <0.03 0.13*   No results for input(s): TROPIPOC in the last 168 hours.   BNPNo results for input(s): BNP, PROBNP in  the last 168 hours.   DDimer No results for input(s): DDIMER in the last 168 hours.   Radiology    US Abdomen Complete  Result Date: 07/21/2017 CLINICAL DATA:  Elevated liver function tests. EXAM: ABDOMEN ULTRASOUND COMPLETE COMPARISON:  None. FINDINGS: Gallbladder: Multiple gallstones are noted with the largest measuring 1.3 cm. No significant gallbladder wall thickening or pericholecystic fluid is noted. Sludge is noted as well. No sonographic Murphy's sign is noted. Common bile duct: Diameter: 5.9 mm which is within normal limits. Liver: 1.1 cm simple cyst is noted in left hepatic lobe. Mildly increased echogenicity of hepatic parenchyma is noted suggesting fatty infiltration or hepatocellular disease. Portal vein is patent on color Doppler imaging with normal direction of blood flow towards the liver. IVC: No abnormality visualized. Pancreas: Visualized portion unremarkable. Spleen: Size and appearance within normal limits. Right Kidney: Length: 12.3 cm. Echogenicity within normal limits. No mass or hydronephrosis visualized. Left Kidney: Length: 13.1 cm. Echogenicity within normal limits. No mass or hydronephrosis visualized. Abdominal aorta: No aneurysm visualized. Other findings: None. IMPRESSION: Cholelithiasis without definite evidence of  cholecystitis. Mildly increased echogenicity of hepatic parenchyma is noted suggesting fatty infiltration or diffuse hepatocellular disease. Small left hepatic cyst is noted. Electronically Signed   By: Lupita Raider, M.D.   On: 07/21/2017 09:54   Ct Abdomen Pelvis W Contrast  Result Date: 07/21/2017 CLINICAL DATA:  Diffuse abdominal pain and distention for several days. Elevated liver function tests. Fever. EXAM: CT ABDOMEN AND PELVIS WITH CONTRAST TECHNIQUE: Multidetector CT imaging of the abdomen and pelvis was performed using the standard protocol following bolus administration of intravenous contrast. CONTRAST:  ISOVUE-300 IOPAMIDOL (ISOVUE-300) INJECTION 61% COMPARISON:  07/21/2017 abdominal sonogram. FINDINGS: Lower chest: Mild patchy tree-in-bud opacities in the dependent right lung base. Subsegmental atelectasis in the dependent lung bases. Hepatobiliary: Normal liver size. A few simple lateral segment left liver lobe cysts, largest 2.0 cm. Several subcentimeter hypodense lesions scattered throughout the liver, too small to characterize, for which no follow-up is required unless the patient has risk factors for liver malignancy. Nondistended gallbladder contains multiple calcified gallstones measuring up to 1.8 cm. No definite gallbladder wall thickening or pericholecystic fluid. No significant intrahepatic biliary ductal dilatation. Common bile duct diameter 6 mm, top-normal. There is a 4 mm calcified stone in the lower third of the common bile duct near the ampulla. There is mild haziness of the fat surrounding the common bile duct. Pancreas: Normal, with no mass or duct dilation. Spleen: Normal size. No mass. Adrenals/Urinary Tract: Normal adrenals. Normal kidneys with no hydronephrosis and no renal mass. Normal bladder. Stomach/Bowel: Small hiatal hernia. Otherwise nondistended and grossly normal stomach. Normal caliber small bowel with no small bowel wall thickening. Appendectomy. Moderate  diffuse colonic diverticulosis, most prominent in the sigmoid colon, with no large bowel wall thickening or pericolonic fat stranding. 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 Patent portal, splenic, hepatic and renal veins. No pathologically enlarged lymph nodes in the abdomen or pelvis. Reproductive: Normal size prostate with nonspecific internal prostatic calcifications. Other: No pneumoperitoneum, ascites or focal fluid collection. Musculoskeletal: No aggressive appearing focal osseous lesions. Moderate thoracolumbar spondylosis, most prominent at L5-S1. IMPRESSION: 1. Cholelithiasis.  No CT findings of acute cholecystitis. 2. Solitary 4 mm choledocholith in the lower third of the common bile duct near the ampulla. CBD diameter 6 mm, top-normal. No intrahepatic biliary ductal dilatation. 3. Haziness of the fat surrounding the common bile duct, cannot exclude ascending cholangitis. 4.  Mild patchy tree-in-bud opacities at the dependent right lung base, compatible with mild aspiration or infectious bronchiolitis. 5. 3.1 cm Abdominal Aortic Aneurysm (ICD10-I71.9). Recommend follow-up aortic ultrasound in 3 years. This recommendation follows ACR consensus guidelines: White Paper of the ACR Incidental Findings Committee II on Vascular Findings. J Am Coll Radiol 2013; 10:789-794. 6.  Aortic Atherosclerosis (ICD10-I70.0). 7. Small hiatal hernia. 8. Moderate diffuse colonic diverticulosis. Electronically Signed   By: Delbert Phenix M.D.   On: 07/21/2017 09:48   Mr 3d Recon At Scanner  Result Date: 07/22/2017 CLINICAL DATA:  Cholelithiasis with choledocholithiasis on CT. Abdominal pain, nausea. EXAM: MRI ABDOMEN WITHOUT AND WITH CONTRAST (INCLUDING MRCP) TECHNIQUE: Multiplanar multisequence MR imaging of the abdomen was performed both before and after the administration of intravenous contrast. Heavily T2-weighted images of the biliary and pancreatic ducts  were obtained, and three-dimensional MRCP images were rendered by post processing. CONTRAST:  20mL MULTIHANCE GADOBENATE DIMEGLUMINE 529 MG/ML IV SOLN COMPARISON:  CT abdomen/pelvis dated 07/21/2017 FINDINGS: Motion degraded images. Lower chest: Lung bases are essentially clear. Hepatobiliary: Scattered hepatic cysts, measuring up to 16 mm in the left hepatic lobe (series 7/ image 12). Layering gallstones (series 7/image 20), without associated inflammatory changes. No intrahepatic ductal dilatation.  No peribiliary enhancement. Common duct measures 7 mm, within normal limits. Associated 5 mm distal CBD stone above the ampulla (series 4/ image 21). Pancreas: Trace fluid along the pancreatic tail (series 6/ image 22). No pancreatic ductal dilatation, mass, or atrophy. No peripancreatic fluid collection/pseudocyst. Spleen:  Within normal limits. Adrenals/Urinary Tract:  Adrenal glands within normal limits. Kidneys are within normal limits.  No hydronephrosis. Stomach/Bowel: Stomach is notable for a tiny hiatal hernia. Visualized bowel is notable for colonic diverticulosis. Vascular/Lymphatic:  No evidence of abdominal aortic aneurysm. No suspicious abdominal lymphadenopathy. Other:  No abdominal ascites. Musculoskeletal: No focal osseous lesions. IMPRESSION: Cholelithiasis, without associated inflammatory changes. Common duct measures 7 mm, within normal limits. Associated 5 mm distal CBD stone above the ampulla. Trace fluid along the pancreatic tail. Correlate with laboratory evaluation to assess for acute pancreatitis. No peripancreatic fluid collection/pseudocyst. Electronically Signed   By: Charline Bills M.D.   On: 07/22/2017 13:00   Mr Abdomen Mrcp W Wo Contast  Result Date: 07/22/2017 CLINICAL DATA:  Cholelithiasis with choledocholithiasis on CT. Abdominal pain, nausea. EXAM: MRI ABDOMEN WITHOUT AND WITH CONTRAST (INCLUDING MRCP) TECHNIQUE: Multiplanar multisequence MR imaging of the abdomen was  performed both before and after the administration of intravenous contrast. Heavily T2-weighted images of the biliary and pancreatic ducts were obtained, and three-dimensional MRCP images were rendered by post processing. CONTRAST:  20mL MULTIHANCE GADOBENATE DIMEGLUMINE 529 MG/ML IV SOLN COMPARISON:  CT abdomen/pelvis dated 07/21/2017 FINDINGS: Motion degraded images. Lower chest: Lung bases are essentially clear. Hepatobiliary: Scattered hepatic cysts, measuring up to 16 mm in the left hepatic lobe (series 7/ image 12). Layering gallstones (series 7/image 20), without associated inflammatory changes. No intrahepatic ductal dilatation.  No peribiliary enhancement. Common duct measures 7 mm, within normal limits. Associated 5 mm distal CBD stone above the ampulla (series 4/ image 21). Pancreas: Trace fluid along the pancreatic tail (series 6/ image 22). No pancreatic ductal dilatation, mass, or atrophy. No peripancreatic fluid collection/pseudocyst. Spleen:  Within normal limits. Adrenals/Urinary Tract:  Adrenal glands within normal limits. Kidneys are within normal limits.  No hydronephrosis. Stomach/Bowel: Stomach is notable for a tiny hiatal hernia. Visualized bowel is notable for colonic diverticulosis. Vascular/Lymphatic:  No evidence of abdominal aortic aneurysm. No suspicious abdominal lymphadenopathy.  Other:  No abdominal ascites. Musculoskeletal: No focal osseous lesions. IMPRESSION: Cholelithiasis, without associated inflammatory changes. Common duct measures 7 mm, within normal limits. Associated 5 mm distal CBD stone above the ampulla. Trace fluid along the pancreatic tail. Correlate with laboratory evaluation to assess for acute pancreatitis. No peripancreatic fluid collection/pseudocyst. Electronically Signed   By: Charline Bills M.D.   On: 07/22/2017 13:00    Cardiac Studies   Echo: Study Conclusions  - Left ventricle: The cavity size was normal. There was moderate   concentric  hypertrophy. Systolic function was normal. The   estimated ejection fraction was in the range of 55% to 60%. Wall   motion was normal; there were no regional wall motion   abnormalities. Doppler parameters are consistent with abnormal   left ventricular relaxation (grade 1 diastolic dysfunction). - Left atrium: The atrium was mildly dilated. - Right ventricle: The cavity size was mildly dilated. - Atrial septum: No defect or patent foramen ovale was identified.  Procedures   LEFT HEART CATH AND CORONARY ANGIOGRAPHY  Conclusion     Mid Cx to Dist Cx lesion, 100 %stenosed.  Mid RCA lesion, 20 %stenosed.  Prox Cx lesion, 20 %stenosed.  Ost 1st Diag to 1st Diag lesion, 60 %stenosed.  Prox LAD lesion, 20 %stenosed.  The left ventricular systolic function is normal.  LV end diastolic pressure is normal.  The left ventricular ejection fraction is greater than 65% by visual estimate.  There is no mitral valve regurgitation.   1. There is right to left filling of a small lateral wall branch. This appears to be a total occlusion of the distal Circumflex where the vessel becomes very small in caliber but cannot totally exclude this being a small Diagonal branch. The distal AV groove Circumflex changes quickly to a small caliber vessel with a lateral branch. This could the site of total occlusion. The vascular territory supplied by this branch is very small.  2. Moderate stenosis in the ostium of the large first Diagonal branch with excellent flow down the vessel. This vessel represents a dual LAD. This lesion does not appear to be ulcerated and doe not appear to be flow limiting.  3. Mild stenosis in the proximal LAD. The mid LAD is tortuous and there is subtle dye staining in this bend with quick dye washout. The LAD continues to the apex.  4. The RCA is a large dominant vessel with mild plaque. The distal right gives off a collateral to a very small lateral wall vessel.  5. Normal LV  systolic function 6. 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 As above, LBBB induced with the placement of the pigtail catheter in the LV. Echo in am. It is unclear if his presentation is due to the occlusion of the small lateral wall branch. He is at the completion of the cath not having any symptoms.       Patient Profile     71 y.o. male presented with acute epigastric pain, weakness, nausea and fever. Ecg suggested septal ST elevation and code STEMI activated   Assessment & Plan    1. CAD. In reviewing serial Ecgs I am not impressed that there is any change compared to 2014. Troponin minimally elevated c/w demand ischemia. He does have occlusion of distal LCx but this appears chronic and has some collaterals. There is moderate stenosis at the origin of a large diagonal. Cath  and Echo show vigorous LV function. At this point I am not convinced that his cardiac issues are the cause of his symptoms 2. Gallstones with elevated LFTs. MRI c/w retained CBD stone.   Plavix discontinued. I think his CV risk is low and he can tolerate whatever needs to be done from a GI standpoint.  3. HLD - will hold off on starting a statin given elevated LFTS.  4. Thrombocytopenia. Patient has significant decline in platelets since admission 145K>>60K. Given this I would also stop ASA. Unclear why platelets are dropping. Will defer to primary team. 5. HTN. Will titrate metoprolol dose up today.  At this point no acute cardiac issues. Will sign off. Please call if you have questions.  For questions or updates, please contact [facility] Please consult [email or web address] for contact info under Cardiology/STEMI.      Signed, Peter [place], MD  07/23/2017, 8:23 AM

## 2017-07-23 NOTE — Progress Notes (Signed)
PROGRESS NOTE  Dustin Clayton  RUE:[id number]  DOB: Jan 29, 1946  DOA: 07/20/2017 PCP: No primary care provider on file.  Brief Admission Hx: 71 year old male with no significant past medical history presented to the ER with complaints of epigastric pain. Patient's pain started today morning 2 hours after breakfast. Pain is mostly epigastric in area than aching nonradiating had associated nausea vomiting no diarrhea. He was admitted for STEMI, taken to cath and it was noted that his LFTs were elevated with fever.  TRH was consulted for elevated LFTs and fever. Scenario presumed to be due to cholangitis and retained stone in CBD. GI on board and contemplating ERCP. Will follow rec's and continue assisting with patient care.      MDM/Assessment & Plan:  1. Elevated liver enzymes/Hyperbilirubinemia/cholangitis and retained stone in CBD- due to retain gallstone in CBD. GI on board and contemplating ERCP to be done on 9/21. Bilirubin and LFT's continue trending down. Will continue zosyn and follow clinical response. Continue holding statins and tylenol. 2. Thrombocytopenia - most likely associated with recent use of heparin, had also hx of alcohol abuse and  Acute infection. Avoid heparin products, follow platelets count. No active bleeding. Continue Holding plavix and ASA. Will follow trend. 3. Fever - presumed to be associated with cholangitis. Has remained afebrile after initiation of antibiotics. No abnormalities on CXR, WBC's WNL. Will continue zosyn. LFT's continue trending down. GI planning ERCP and sphincterectomy tomorrow 9/21.  4. Hx of CAD - no CP or SOB. Troponin except for one isolated result (mildly flat elevation) has been neg. Patient without EKG or telemetry changes for ischemia. Will continue medical management as per cardiology rec's. For now due to acute elevation on LFT's holding statins and in presence of low platelets/planned ERCP holding plavix and ASA.  5. Alcohol consumption- no  withdrawal appreciated. Cessation counseling provided. Continue thiamine and folic acid. Patient monitored on CIWA protocol. 6. Chronic back pain - stable, and controlled. Continue PRN advil.  DVT prophylaxis: SCDs Code Status: FULL  Family Communication: patient's wife at bedside  Disposition Plan: will transfer to telemetry; clear for any procedure by cardiology service; planning for ERCP tomorrow (9/21). Will continue IV antibiotics and supportive care.  Subjective: Patient in no acute distress and feeling much better overall. No CP, no nausea, no vomiting and no SOB.  Objective: Vitals:   07/23/17 1100 07/23/17 1116 07/23/17 1155 07/23/17 1200  BP: (!) 174/81 (!) 163/83  (!) 163/91  Pulse: 66 65  65  Resp: 17 (!) 9  12  Temp:   99 F (37.2 C)   TempSrc:   Oral   SpO2: 98% 96%  96%  Weight:      Height:        Intake/Output Summary (Last 24 hours) at 07/23/17 1331 Last data filed at 07/23/17 1200  Gross per 24 hour  Intake           3982.5 ml  Output             2765 ml  Net           1217.5 ml   Filed Weights   07/20/17 2009 07/20/17 2200  Weight: 102.1 kg (225 lb) 101.7 kg (224 lb 3.3 oz)    Exam:  General exam: afebrile, no CP and no SOB. Reports no abd pain, no nausea, no vomiting. No icterus.  Respiratory system: good air movement bilaterally, no wheezing, no crackles. Cardiovascular system: S1 and S2, no rubs, no gallops 1 and  S2, no rubs, no gallops, no JVD Gastrointestinal system: no guarding, no tenderness, no distension. Positive BS. Central nervous system: CN intact, no focal deficit appreciated. Following command properly.  Extremities: no edema, no cyanosis, no clubbing   Data Reviewed: Basic Metabolic Panel:  Recent Labs Lab 07/20/17 2015 07/21/17 0508 07/22/17 0442 07/23/17 0246  NA 134* 134* 139 137  K 3.5 4.0 3.6 4.0  CL 102 100* 107 107  CO2 21* GLUCOSE 158* 131* 102* 95  BUN CREATININE 0.91 1.20 1.01 0.90    CALCIUM 8.5* 8.3* 8.2* 8.1*   Liver Function Tests:  Recent Labs Lab 07/20/17 2015 07/21/17 0024 07/21/17 1127 07/22/17 0442 07/23/17 0246  AST 573* 744* 714* 363* 186*  ALT 365* 577* 752* 578* 399*  ALKPHOS 110 101 91 88 128*  BILITOT 3.0* 4.1* 4.2* 3.7* 4.0*  PROT 6.1* 5.7* 5.5* 5.5* 5.4*  ALBUMIN 3.7 3.4* 3.1* 2.9* 2.8*    Recent Labs Lab 07/20/17 2015  LIPASE 29   CBC:  Recent Labs Lab 07/20/17 2015 07/21/17 0508 07/22/17 0442 07/23/17 0246  WBC 7.1 8.5 4.7 5.1  NEUTROABS 6.3  --   --   --   HGB 13.9 13.0 12.5* 12.6*  HCT 40.7 38.9* 38.6* 39.0  MCV 94.0 94.9 96.7 96.5  PLT 145* 109* 60* 60*   Cardiac Enzymes:  Recent Labs Lab 07/20/17 2015 07/21/17 0508 07/21/17 1127  TROPONINI <0.03 <0.03 0.13*   CBG (last 3)  No results for input(s): GLUCAP in the last 72 hours. Recent Results (from the past 240 hour(s))  MRSA PCR Screening     Status: None   Collection Time: 07/20/17  9:52 PM  Result Value Ref Range Status   MRSA by PCR NEGATIVE NEGATIVE Final    Comment:        The GeneXpert MRSA Assay (FDA approved for NASAL specimens only), is one component of a comprehensive MRSA colonization surveillance program. It is not intended to diagnose MRSA infection nor to guide or monitor treatment for MRSA infections.   Culture, blood (routine x 2)     Status: None (Preliminary result)   Collection Time: 07/21/17 12:24 AM  Result Value Ref Range Status   Specimen Description BLOOD LEFT HAND  Final   Special Requests   Final    BOTTLES DRAWN AEROBIC AND ANAEROBIC Blood Culture adequate volume   Culture NO GROWTH 1 DAY  Final   Report Status PENDING  Incomplete  Culture, blood (routine x 2)     Status: None (Preliminary result)   Collection Time: 07/21/17 12:32 AM  Result Value Ref Range Status   Specimen Description BLOOD LEFT HAND  Final   Special Requests   Final    BOTTLES DRAWN AEROBIC AND ANAEROBIC Blood Culture adequate volume   Culture NO  GROWTH 1 DAY  Final   Report Status PENDING  Incomplete     Studies: Mr 3d Recon At Scanner  Result Date: 07/22/2017 CLINICAL DATA:  Cholelithiasis with choledocholithiasis on CT. Abdominal pain, nausea. EXAM: MRI ABDOMEN WITHOUT AND WITH CONTRAST (INCLUDING MRCP) TECHNIQUE: Multiplanar multisequence MR imaging of the abdomen was performed both before and after the administration of intravenous contrast. Heavily T2-weighted images of the biliary and pancreatic ducts were obtained, and three-dimensional MRCP images were rendered by post processing. CONTRAST:  20mL MULTIHANCE GADOBENATE DIMEGLUMINE 529 MG/ML IV SOLN COMPARISON:  CT abdomen/pelvis dated 07/21/2017 FINDINGS: Motion degraded images. Lower chest: Lung bases are essentially  clear. Hepatobiliary: Scattered hepatic cysts, measuring up to 16 mm in the left hepatic lobe (series 7/ image 12). Layering gallstones (series 7/image 20), without associated inflammatory changes. No intrahepatic ductal dilatation.  No peribiliary enhancement. Common duct measures 7 mm, within normal limits. Associated 5 mm distal CBD stone above the ampulla (series 4/ image 21). Pancreas: Trace fluid along the pancreatic tail (series 6/ image 22). No pancreatic ductal dilatation, mass, or atrophy. No peripancreatic fluid collection/pseudocyst. Spleen:  Within normal limits. Adrenals/Urinary Tract:  Adrenal glands within normal limits. Kidneys are within normal limits.  No hydronephrosis. Stomach/Bowel: Stomach is notable for a tiny hiatal hernia. Visualized bowel is notable for colonic diverticulosis. Vascular/Lymphatic:  No evidence of abdominal aortic aneurysm. No suspicious abdominal lymphadenopathy. Other:  No abdominal ascites. Musculoskeletal: No focal osseous lesions. IMPRESSION: Cholelithiasis, without associated inflammatory changes. Common duct measures 7 mm, within normal limits. Associated 5 mm distal CBD stone above the ampulla. Trace fluid along the pancreatic  tail. Correlate with laboratory evaluation to assess for acute pancreatitis. No peripancreatic fluid collection/pseudocyst. Electronically Signed   By: Charline Bills M.D.   On: 07/22/2017 13:00   Mr Abdomen Mrcp W Wo Contast  Result Date: 07/22/2017 CLINICAL DATA:  Cholelithiasis with choledocholithiasis on CT. Abdominal pain, nausea. EXAM: MRI ABDOMEN WITHOUT AND WITH CONTRAST (INCLUDING MRCP) TECHNIQUE: Multiplanar multisequence MR imaging of the abdomen was performed both before and after the administration of intravenous contrast. Heavily T2-weighted images of the biliary and pancreatic ducts were obtained, and three-dimensional MRCP images were rendered by post processing. CONTRAST:  20mL MULTIHANCE GADOBENATE DIMEGLUMINE 529 MG/ML IV SOLN COMPARISON:  CT abdomen/pelvis dated 07/21/2017 FINDINGS: Motion degraded images. Lower chest: Lung bases are essentially clear. Hepatobiliary: Scattered hepatic cysts, measuring up to 16 mm in the left hepatic lobe (series 7/ image 12). Layering gallstones (series 7/image 20), without associated inflammatory changes. No intrahepatic ductal dilatation.  No peribiliary enhancement. Common duct measures 7 mm, within normal limits. Associated 5 mm distal CBD stone above the ampulla (series 4/ image 21). Pancreas: Trace fluid along the pancreatic tail (series 6/ image 22). No pancreatic ductal dilatation, mass, or atrophy. No peripancreatic fluid collection/pseudocyst. Spleen:  Within normal limits. Adrenals/Urinary Tract:  Adrenal glands within normal limits. Kidneys are within normal limits.  No hydronephrosis. Stomach/Bowel: Stomach is notable for a tiny hiatal hernia. Visualized bowel is notable for colonic diverticulosis. Vascular/Lymphatic:  No evidence of abdominal aortic aneurysm. No suspicious abdominal lymphadenopathy. Other:  No abdominal ascites. Musculoskeletal: No focal osseous lesions. IMPRESSION: Cholelithiasis, without associated inflammatory changes.  Common duct measures 7 mm, within normal limits. Associated 5 mm distal CBD stone above the ampulla. Trace fluid along the pancreatic tail. Correlate with laboratory evaluation to assess for acute pancreatitis. No peripancreatic fluid collection/pseudocyst. Electronically Signed   By: Charline Bills M.D.   On: 07/22/2017 13:00     Scheduled Meds: . folic acid  1 mg Oral Daily  . metoprolol tartrate  50 mg Oral BID  . sodium chloride flush  3 mL Intravenous Q12H  . thiamine  100 mg Oral Daily   Continuous Infusions: . sodium chloride    . sodium chloride 100 mL/hr at 07/23/17 0951  . piperacillin-tazobactam (ZOSYN)  IV Stopped (07/23/17 0913)    Time spent: 25 minutes.   Vassie Loll, MD [facility] Pager [phone]  If 7PM-7AM, please contact night-coverage [email or web address] Password [email or web address] 07/23/2017, 1:31 PM    LOS: 3 days

## 2017-07-23 NOTE — Progress Notes (Signed)
[facility] Progress Note  Dustin Clayton 71 y.o. Jun 05, 1946  CC:  Elevated LFTs, CBD stone   Subjective: Patient feeling better this morning. Denied abdominal pain, nausea or vomiting. Remains afebrile this morning. Complaining of poor appetite. No new changes since yesterday  ROS : Chest pain resolved. Shortness of breath is improving.   Objective: Vital signs in last 24 hours: Vitals:   07/23/17 0700 07/23/17 0800  BP: (!) 163/88 (!) 158/83  Pulse: 73 70  Resp: 14 14  Temp:    SpO2: 98% 97%    Physical Exam:  General:  Alert, cooperative, no distress, appears stated age  Head:  Normocephalic, without obvious abnormality, atraumatic  Eyes:  Mild scleral icterus   Lungs:   Clear to auscultation bilaterally, respirations unlabored  Heart:  Regular rate and rhythm, S1, S2 normal  Abdomen:   Soft, non-tender, bowel sounds active all four quadrants,  no masses, No peritoneal signs   Extremities: Extremities normal, atraumatic, no  edema  Pulses: 2+ and symmetric    Lab Results:  Recent Labs  07/22/17 0442 07/23/17 0246  NA 139 137  K 3.6 4.0  CL 107 107  CO2 29 26  GLUCOSE 102* 95  BUN 10 7  CREATININE 1.01 0.90  CALCIUM 8.2* 8.1*    Recent Labs  07/22/17 0442 07/23/17 0246  AST 363* 186*  ALT 578* 399*  ALKPHOS 88 128*  BILITOT 3.7* 4.0*  PROT 5.5* 5.4*  ALBUMIN 2.9* 2.8*    Recent Labs  07/20/17 2015  07/22/17 0442 07/23/17 0246  WBC 7.1  < > 4.7 5.1  NEUTROABS 6.3  --   --   --   HGB 13.9  < > 12.5* 12.6*  HCT 40.7  < > 38.6* 39.0  MCV 94.0  < > 96.7 96.5  PLT 145*  < > 60* 60*  < > = values in this interval not displayed.  Recent Labs  07/20/17 2015  LABPROT 13.1  INR 1.00      Assessment/Plan: - Abdominal pain, fever along with elevated LFTs. CT scan showing CBD of 6 mm with possible 4 mm common bile duct stone in the distal duct.MRCP confirming CBD stone in the distal duct along with mild pancreatitis - Mild  pancreatitis. Gallstone. - Elevated LFTs.  likely from CBD stone - History of alcohol use - STEMI - Negative LHC. Plavix on hold. Last dose 09/18.   Recommendation ------------------------ - ERCP tomorrow with sphincterotomy and possible stone removal. Risks benefits alternatives discussed with the patient in detail. Risk includes not limited to infection, bleeding, perforation and post-ERCP pancreatitis. - Patient remains at high risk of bleeding because of recent use of Plavix and thrombocytopenia. - Continue normal saline at 1 50 mL/h for mild pancreatitis. - Continue current diet. Nothing by mouth past midnight.   Kathi Der MD, FACP 07/23/2017, 8:44 AM  Pager [phone]  If no answer or after 5 PM call [phone]

## 2017-07-24 ENCOUNTER — Encounter (HOSPITAL_COMMUNITY): Payer: Self-pay | Admitting: *Deleted

## 2017-07-24 ENCOUNTER — Inpatient Hospital Stay (HOSPITAL_COMMUNITY): Payer: BC Managed Care – PPO | Admitting: Certified Registered Nurse Anesthetist

## 2017-07-24 ENCOUNTER — Encounter (HOSPITAL_COMMUNITY)
Admission: EM | Disposition: A | Discharge status: Home / Self Care | Payer: Self-pay | Source: Home / Self Care | Attending: Cardiovascular Disease

## 2017-07-24 ENCOUNTER — Inpatient Hospital Stay (HOSPITAL_COMMUNITY): Payer: BC Managed Care – PPO

## 2017-07-24 DIAGNOSIS — K269 Duodenal ulcer, unspecified as acute or chronic, without hemorrhage or perforation: Secondary | ICD-10-CM

## 2017-07-24 DIAGNOSIS — K831 Obstruction of bile duct: Secondary | ICD-10-CM

## 2017-07-24 HISTORY — PX: ERCP: SHX5425

## 2017-07-24 LAB — CBC
HEMATOCRIT: 41.6 % (ref 39.0–52.0)
HEMOGLOBIN: 13.8 g/dL (ref 13.0–17.0)
MCH: 31.3 pg (ref 26.0–34.0)
MCHC: 33.2 g/dL (ref 30.0–36.0)
MCV: 94.3 fL (ref 78.0–100.0)
Platelets: 81 10*3/uL — ABNORMAL LOW (ref 150–400)
RBC: 4.41 MIL/uL (ref 4.22–5.81)
RDW: 13.2 % (ref 11.5–15.5)
WBC: 4.4 10*3/uL (ref 4.0–10.5)

## 2017-07-24 LAB — COMPREHENSIVE METABOLIC PANEL
ALBUMIN: 2.8 g/dL — AB (ref 3.5–5.0)
ALK PHOS: 223 U/L — AB (ref 38–126)
ALT: 331 U/L — ABNORMAL HIGH (ref 17–63)
ANION GAP: 9 (ref 5–15)
AST: 133 U/L — AB (ref 15–41)
BILIRUBIN TOTAL: 4.9 mg/dL — AB (ref 0.3–1.2)
BUN: 6 mg/dL (ref 6–20)
CALCIUM: 8.6 mg/dL — AB (ref 8.9–10.3)
CO2: 23 mmol/L (ref 22–32)
Chloride: 106 mmol/L (ref 101–111)
Creatinine, Ser: 0.81 mg/dL (ref 0.61–1.24)
GFR calc Af Amer: >60 mL/min (ref >60)
GLUCOSE: 100 mg/dL — AB (ref 65–99)
Potassium: 3.3 mmol/L — ABNORMAL LOW (ref 3.5–5.1)
Sodium: 138 mmol/L (ref 135–145)
TOTAL PROTEIN: 5.8 g/dL — AB (ref 6.5–8.1)

## 2017-07-24 LAB — PROCALCITONIN: PROCALCITONIN: 0.3 ng/mL

## 2017-07-24 SURGERY — ERCP, WITH INTERVENTION IF INDICATED
Anesthesia: General

## 2017-07-24 MED ORDER — POTASSIUM CHLORIDE CRYS ER 20 MEQ PO TBCR
40.0000 meq | EXTENDED_RELEASE_TABLET | ORAL | Status: AC
  Administered 2017-07-24 (×2): 40 meq via ORAL
  Filled 2017-07-24 (×2): qty 2

## 2017-07-24 MED ORDER — PROPOFOL 10 MG/ML IV BOLUS
INTRAVENOUS | Status: DC | PRN
  Administered 2017-07-24: 200 mg via INTRAVENOUS

## 2017-07-24 MED ORDER — ROCURONIUM BROMIDE 100 MG/10ML IV SOLN
INTRAVENOUS | Status: DC | PRN
  Administered 2017-07-24: 50 mg via INTRAVENOUS

## 2017-07-24 MED ORDER — AMOXICILLIN-POT CLAVULANATE 875-125 MG PO TABS
1.0000 | ORAL_TABLET | Freq: Two times a day (BID) | ORAL | Status: DC
  Administered 2017-07-25: 1 via ORAL
  Filled 2017-07-24: qty 1

## 2017-07-24 MED ORDER — SUGAMMADEX SODIUM 200 MG/2ML IV SOLN
INTRAVENOUS | Status: DC | PRN
  Administered 2017-07-24: 300 mg via INTRAVENOUS

## 2017-07-24 MED ORDER — INDOMETHACIN 50 MG RE SUPP
RECTAL | Status: DC | PRN
  Administered 2017-07-24: 100 mg via RECTAL

## 2017-07-24 MED ORDER — ONDANSETRON HCL 4 MG/2ML IJ SOLN
INTRAMUSCULAR | Status: DC | PRN
  Administered 2017-07-24: 4 mg via INTRAVENOUS

## 2017-07-24 MED ORDER — INDOMETHACIN 50 MG RE SUPP
RECTAL | Status: AC
  Filled 2017-07-24: qty 2

## 2017-07-24 MED ORDER — SODIUM CHLORIDE 0.9 % IV SOLN: INTRAVENOUS | Status: DC

## 2017-07-24 MED ORDER — LACTATED RINGERS IV SOLN
INTRAVENOUS | Status: AC | PRN
  Administered 2017-07-24: 1000 mL via INTRAVENOUS

## 2017-07-24 MED ORDER — OXYCODONE HCL 5 MG/5ML PO SOLN: 5.0000 mg | Freq: Once | ORAL | Status: DC | PRN

## 2017-07-24 MED ORDER — GLUCAGON HCL RDNA (DIAGNOSTIC) 1 MG IJ SOLR
INTRAMUSCULAR | Status: AC
Start: 2017-07-24 — End: 2017-07-24
  Filled 2017-07-24: qty 1

## 2017-07-24 MED ORDER — SUCCINYLCHOLINE CHLORIDE 20 MG/ML IJ SOLN
INTRAMUSCULAR | Status: DC | PRN
  Administered 2017-07-24: 120 mg via INTRAVENOUS

## 2017-07-24 MED ORDER — PHENYLEPHRINE HCL 10 MG/ML IJ SOLN
INTRAVENOUS | Status: DC | PRN
  Administered 2017-07-24: 25 ug/min via INTRAVENOUS

## 2017-07-24 MED ORDER — FENTANYL CITRATE (PF) 100 MCG/2ML IJ SOLN
INTRAMUSCULAR | Status: DC | PRN
  Administered 2017-07-24: 100 ug via INTRAVENOUS

## 2017-07-24 MED ORDER — IOPAMIDOL (ISOVUE-300) INJECTION 61%
INTRAVENOUS | Status: AC
Start: 2017-07-24 — End: 2017-07-24
  Filled 2017-07-24: qty 50

## 2017-07-24 MED ORDER — LIDOCAINE HCL (CARDIAC) 20 MG/ML IV SOLN
INTRAVENOUS | Status: DC | PRN
  Administered 2017-07-24: 80 mg via INTRATRACHEAL

## 2017-07-24 MED ORDER — OXYCODONE HCL 5 MG PO TABS: 5.0000 mg | ORAL_TABLET | Freq: Once | ORAL | Status: DC | PRN

## 2017-07-24 MED ORDER — HYDROMORPHONE HCL 1 MG/ML IJ SOLN: 0.2500 mg | INTRAMUSCULAR | Status: DC | PRN

## 2017-07-24 MED ORDER — PANTOPRAZOLE SODIUM 40 MG PO TBEC
40.0000 mg | DELAYED_RELEASE_TABLET | Freq: Two times a day (BID) | ORAL | Status: DC
  Administered 2017-07-24 – 2017-07-25 (×2): 40 mg via ORAL
  Filled 2017-07-24 (×2): qty 1

## 2017-07-24 NOTE — Anesthesia Postprocedure Evaluation (Signed)
Anesthesia Post Note  Patient: Dustin Clayton  Procedure(s) Performed: Procedure(s) (LRB): ENDOSCOPIC RETROGRADE CHOLANGIOPANCREATOGRAPHY (ERCP) (N/A)     Patient location during evaluation: Endoscopy Anesthesia Type: General Level of consciousness: awake and sedated Pain management: pain level controlled Vital Signs Assessment: post-procedure vital signs reviewed and stable Respiratory status: spontaneous breathing, nonlabored ventilation, respiratory function stable and patient connected to nasal cannula oxygen Cardiovascular status: blood pressure returned to baseline and stable Postop Assessment: no apparent nausea or vomiting Anesthetic complications: no    Last Vitals:  Vitals:   07/24/17 0840 07/24/17 0850  BP: (!) 170/74 (!) 170/82  Pulse: 72 70  Resp: 20 16  Temp:  37.2 C  SpO2: 100% 96%    Last Pain:  Vitals:   07/24/17 0850  TempSrc: Oral  PainSc:                  Jackquelyn Sundberg,JAMES TERRILL

## 2017-07-24 NOTE — Anesthesia Preprocedure Evaluation (Addendum)
Anesthesia Evaluation  Patient identified by MRN, date of birth, ID band Patient awake    Reviewed: Allergy & Precautions, NPO status , Patient's Chart, lab work & pertinent test results  History of Anesthesia Complications Negative for: history of anesthetic complications  Airway Mallampati: I  TM Distance: >3 FB Neck ROM: Full    Dental no notable dental hx. (+) Dental Advisory Given   Pulmonary former smoker,    breath sounds clear to auscultation       Cardiovascular hypertension, + CAD   Rhythm:Regular Rate:Normal  Recent demand ischemia and some CAD noted   Neuro/Psych    GI/Hepatic (+) Hepatitis -elevated LFTs and cholangitis   Endo/Other    Renal/GU      Musculoskeletal   Abdominal   Peds  Hematology  (+) Blood dyscrasia, , Decreased plts   Anesthesia Other Findings   Reproductive/Obstetrics                            Anesthesia Physical Anesthesia Plan  ASA: III  Anesthesia Plan: General   Post-op Pain Management:    Induction: Intravenous  PONV Risk Score and Plan: 3 and Ondansetron, Dexamethasone, Midazolam and Propofol infusion  Airway Management Planned: Oral ETT  Additional Equipment:   Intra-op Plan:   Post-operative Plan: Extubation in OR  Informed Consent: I have reviewed the patients History and Physical, chart, labs and discussed the procedure including the risks, benefits and alternatives for the proposed anesthesia with the patient or authorized representative who has indicated his/her understanding and acceptance.   Dental advisory given  Plan Discussed with: CRNA  Anesthesia Plan Comments:         Anesthesia Quick Evaluation

## 2017-07-24 NOTE — Progress Notes (Signed)
Pharmacy Antibiotic Note  Dustin Clayton is a 71 y.o. male admitted on 07/20/2017 with chest pain, s/p cath, now possible sepsis due to GI source .  Pharmacy has been consulted for Zosyn dosing (day 4 of therapy). He is now s/p ERCP on 9/21  -WBC= 6.6, afeb, cultures- ngtd, CrCl ~ 100  Plan: Zosyn 3.375 g IV q8h  Consider defining length of therapy?  Height:  (180.3 cm) Weight: 222 lb (100.7 kg) IBW/kg (Calculated) : 75.3  Temp (24hrs), Avg:98.5 F (36.9 C), Min:97.7 F (36.5 C), Max:99 F (37.2 C)   Recent Labs Lab 07/20/17 2015 07/21/17 0024 07/21/17 0508 07/22/17 0442 07/23/17 0246 07/24/17 0433  WBC 7.1  --  8.5 4.7 5.1 4.4  CREATININE 0.91  --  1.20 1.01 0.90 0.81  LATICACIDVEN  --  1.4  --   --   --   --     Estimated Creatinine Clearance: 98.2 mL/min (by C-G formula based on SCr of 0.81 mg/dL).    No Known Allergies  Harland German, Pharm D 07/24/2017 10:05 AM

## 2017-07-24 NOTE — Transfer of Care (Signed)
Immediate Anesthesia Transfer of Care Note  Patient: Dustin Clayton  Procedure(s) Performed: Procedure(s): ENDOSCOPIC RETROGRADE CHOLANGIOPANCREATOGRAPHY (ERCP) (N/A)  Patient Location: PACU  Anesthesia Type:General  Level of Consciousness: awake, alert  and oriented  Airway & Oxygen Therapy: Patient Spontanous Breathing and Patient connected to nasal cannula oxygen  Post-op Assessment: Report given to RN and Post -op Vital signs reviewed and stable  Post vital signs: Reviewed and stable  Last Vitals:  Vitals:   07/24/17 0703 07/24/17 0833  BP: (!) 177/87 (!) 181/82  Pulse:  71  Resp: 19 15  Temp: 36.9 C 36.9 C  SpO2: 96% 95%    Last Pain:  Vitals:   07/24/17 0833  TempSrc: Oral  PainSc:       Patients Stated Pain Goal: 0 (07/23/17 2348)  Complications: No apparent anesthesia complications

## 2017-07-24 NOTE — Op Note (Signed)
[facility] Patient Name: Dustin Clayton Procedure Date : 07/24/2017 MRN: [id number] Attending MD: Vida Rigger , MD Date of Birth: [birth year] CSN: [id number] Age: 71 Admit Type: Inpatient Procedure:                ERCP Indications:              Suspected bile duct stone(s) Providers:                Vida Rigger, MD, Tomma Rakers, RN, Arlee Muslim                            Tech., Technician, Albertina Senegal. Beckner, CRNA Referring MD:              Medicines:                General Anesthesia Complications:            No immediate complications. Estimated Blood Loss:     Estimated blood loss: none. Procedure:                Pre-Anesthesia Assessment:                           - Prior to the procedure, a History and Physical                            was performed, and patient medications and                            allergies were reviewed. The patient's tolerance of                            previous anesthesia was also reviewed. The risks                            and benefits of the procedure and the sedation                            options and risks were discussed with the patient.                            All questions were answered, and informed consent                            was obtained. Prior Anticoagulants: The patient has                            taken Plavix (clopidogrel), last dose was 2 days                            prior to procedure. ASA Grade Assessment: III - A                            patient with severe systemic disease. After  reviewing the risks and benefits, the patient was                            deemed in satisfactory condition to undergo the                            procedure.                           After obtaining informed consent, the scope was                            passed under direct vision. Throughout the                            procedure, the patient's blood pressure, pulse, and                             oxygen saturations were monitored continuously. The                            Duodenoscope was introduced through the mouth, and                            used to inject contrast into and used to cannulate                            the bile duct. 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 Successful completion of the procedure                            was aided by performing the maneuvers documented                            (below) in this report. The patient tolerated the                            procedure well. Scope In: Scope Out: Findings:      The scope was passed under direct vision through the upper GI tract.       Many non-bleeding cratered and superficial duodenal ulcers were found in       the duodenal bulb, in the first portion of the duodenum and in the       second portion of the duodenum. The major papilla was adjacent to a       diverticulum. The major papilla was bulging.on the first 2 attempts the       guidewire went towards the pancreas and on the third attempt despite       repositioning it went towards the pancreas so we placed One 5  Fr by 3 cm       plastic stent with a single external pigtail and no internal flaps was       placed 2.5 cm into the ventral pancreatic duct. The stent was in good       position. we then were able to fairly easily cannulate the CBD and deep       selective cannulation was obtained and a stent was confirmed on initial       cholangiogramand we proceeded with a Biliary sphincterotomy was made       with a Hydratome sphincterotome using ERBE electrocautery. There was no       post-sphincterotomy bleeding. Choledocholithiasis was found in a       nondilated duct. The lower third of the main bile duct contained 1       stoneand we proceeded with sweeping the duct with the 12 mm balloon       starting at  the bifurcation and after distend was delivered and 2       negative balloon pull throughs we proceeded with a occlusion       cholangiogram in the customary fashion which was normal there was       adequate biliary drainage the wire and the balloon catheter were removed       endoscope was removed and the patient tolerated the procedure well there       was no obvious immediate complications Impression:               - Multiple non-bleeding duodenal ulcers.                           - The major papilla was adjacent to a diverticulum.                           - The major papilla appeared to be bulging.                           - Choledocholithiasis was found. Complete removal                            was accomplished by biliary sphincterotomy and                            balloon extraction.                           - One plastic stent was placed into the ventral                            pancreatic duct.to decreased risk of pancreatitis                            and assist with cannulation as above                           - A biliary sphincterotomy was performed.                           - The biliary tree was swept. no  residual stone was                            seen at the end of the procedure Moderate Sedation:      moderate sedation-none Recommendation:           - Avoid aspirin and nonsteroidal anti-inflammatory                            medicines indefinitely as much as possible. in use                            pump inhibitors long-term particularly as long as                            on an aspirin                           - Clear liquid diet today. may slowly advance for                            evening meal or tomorrow                           - Continue present medications. may resume blood                            thinners tomorrow if no signs of complication or                            bleeding                           - Confirm spontaneous stent  passage by performing a                            flat and upright abdominal x-ray in 5 days.                           - Refer to a surgeon at appointment to be                            scheduled. proceed with laparoscopic                            cholecystectomy when okay by cardiology                           - Telephone GI clinic if symptomatic PRN.                           - Return to GI clinic in 2 weeks. follow liver                            tests back to normal Procedure Code(s):        ---  Professional ---                           [phone], Endoscopic retrograde                            cholangiopancreatography (ERCP); with placement of                            endoscopic stent into biliary or pancreatic duct,                            including pre- and post-dilation and guide wire                            passage, when performed, including sphincterotomy,                            when performed, each stent                           43264, Endoscopic retrograde                            cholangiopancreatography (ERCP); with removal of                            calculi/debris from biliary/pancreatic duct(s)                           43262, 59, Endoscopic retrograde                            cholangiopancreatography (ERCP); with                            sphincterotomy/papillotomy Diagnosis Code(s):        --- Professional ---                           K26.9, Duodenal ulcer, unspecified as acute or                            chronic, without hemorrhage or perforation                           K83.9, Disease of biliary tract, unspecified                           K80.50, Calculus of bile duct without cholangitis                            or cholecystitis without obstruction CPT copyright 2016 [employer]. All rights reserved. The codes documented in this report are preliminary and upon coder review may  be revised to meet current compliance  requirements. Vida Rigger, MD 07/24/2017 8:39:48 AM This report has been signed electronically. Number of Addenda: 0

## 2017-07-24 NOTE — Progress Notes (Signed)
Dustin Clayton 7:44 AM  Subjective: Patient doing fine without significant GI symptoms currently and the case discussed with my partner Dr. Leonard Schwartz and his hospital computer chart reviewed and we discussed the procedure  Objective: Vital signs stable afebrile no acute distress exam please see preassessment evaluation labs and MRCP reviewed  Assessment: CBD stone  Plan: The risks benefits methods and success rate of ERCP was rediscussed with the patient and will proceed this morning with anesthesia assistance  [facility] E  Pager [phone] After 5PM or if no answer call [phone]

## 2017-07-24 NOTE — Progress Notes (Signed)
PROGRESS NOTE  Dustin Clayton  ZOX:[id number]  DOB: 1946/03/19  DOA: 07/20/2017 PCP: No primary care provider on file.  Brief Admission Hx: 71 year old male with no significant past medical history presented to the ER with complaints of epigastric pain. Patient's pain started today morning 2 hours after breakfast. Pain is mostly epigastric in area than aching nonradiating had associated nausea vomiting no diarrhea. He was admitted for STEMI, taken to cath and it was noted that his LFTs were elevated with fever.  TRH was consulted for elevated LFTs and fever. Scenario presumed to be due to cholangitis and retained stone in CBD. GI on board and contemplating ERCP. Will follow rec's and continue assisting with patient care.      MDM/Assessment & Plan:  1. Elevated liver enzymes/Hyperbilirubinemia/cholangitis and retained stone in CBD- due to retain gallstone in CBD. GI on board and contemplating ERCP done today 9/21. Bilirubin and LFT's continue trending down. Will stop zosyn and treat with augmentin for 5 more days. Continue holding statins and tylenol. 2. Thrombocytopenia - most likely associated with recent use of heparin, had also hx of alcohol abuse and  Acute infection. Avoid heparin products, follow platelets count. No active bleeding. Continue Holding plavix and ASA. Will follow trend. 3. Fever - presumed to be associated with cholangitis. Has remained afebrile after initiation of antibiotics. No abnormalities on CXR, WBC's WNL. Will discontinued zosyn and start augmentin, with plan for 5 more days.  4. Hx of CAD - no CP or SOB. Troponin except for one isolated result (mildly flat elevation) has been neg. Patient without EKG or telemetry changes for ischemia. Will continue medical management as per cardiology rec's. For now due to acute elevation on LFT's holding statins and in presence of low platelets/planned ERCP holding plavix and ASA. Will resume them as instructed by GI, in 24-48 hours from  today if not signs of bleeding. 5. Alcohol consumption- no withdrawal appreciated. Cessation counseling provided. Continue thiamine and folic acid. Patient monitored on CIWA protocol. 6. Chronic back pain - stable, and controlled. Continue PRN analgesics. 7. Gastritis and duodenal ulcers: no active bleeding appreciated. Will use PPI daily. Patient encouraged to stop alcohol use.  DVT prophylaxis: SCDs Code Status: FULL  Family Communication: patient's wife at bedside  Disposition Plan: remains inpatient; slowly advance diet as recommended/instructed by GI service; will also transition abx's to PO. If stable most likely home in am. Tolerated well ERCP.  Subjective: Patient is afebrile, no CP, no SOB, no abd, pain, no nausea, no vomiting.   Objective: Vitals:   07/24/17 1235 07/24/17 1635 07/24/17 2108 07/24/17 2112  BP: (!) 166/85 (!) 142/75 (!) 146/87   Pulse:   63   Resp:   14   Temp:    98.1 F (36.7 C)  TempSrc:    Oral  SpO2:   97%   Weight:      Height:        Intake/Output Summary (Last 24 hours) at 07/24/17 2217 Last data filed at 07/24/17 1700  Gross per 24 hour  Intake             1945 ml  Output             1976 ml  Net              -31 ml   Filed Weights   07/23/17 2348 07/24/17 0500 07/24/17 0703  Weight: 101 kg (222 lb 9.6 oz) 101 kg (222 lb 9.6 oz) 100.7 kg (222  lb)    Exam:  General exam: afebrile, no CP, no nausea, no vomiting. Slightly sleepy after procedure. No icterus, no appreciated jaundice.  Respiratory system: good air movement, no wheezing, no crackles Cardiovascular system: S1 and S2, no rubs, no gallops, no murmurs Gastrointestinal system: soft, NT, ND, positive BS Central nervous system: CN intact, no focal deficit   Extremities: no edema, no cyanosis, no clubbing   Data Reviewed: Basic Metabolic Panel:  Recent Labs Lab 07/20/17 2015 07/21/17 0508 07/22/17 0442 07/23/17 0246 07/24/17 0433  NA 134* 134* 139 137 138  K 3.5 4.0 3.6  4.0 3.3*  CL 102 100* 107 107 106  CO2 21* GLUCOSE 158* 131* 102* 95 100*  BUN CREATININE 0.91 1.20 1.01 0.90 0.81  CALCIUM 8.5* 8.3* 8.2* 8.1* 8.6*   Liver Function Tests:  Recent Labs Lab 07/21/17 0024 07/21/17 1127 07/22/17 0442 07/23/17 0246 07/24/17 0433  AST 744* 714* 363* 186* 133*  ALT 577* 752* 578* 399* 331*  ALKPHOS 101 91 88 128* 223*  BILITOT 4.1* 4.2* 3.7* 4.0* 4.9*  PROT 5.7* 5.5* 5.5* 5.4* 5.8*  ALBUMIN 3.4* 3.1* 2.9* 2.8* 2.8*    Recent Labs Lab 07/20/17 2015  LIPASE 29   CBC:  Recent Labs Lab 07/20/17 2015 07/21/17 0508 07/22/17 0442 07/23/17 0246 07/24/17 0433  WBC 7.1 8.5 4.7 5.1 4.4  NEUTROABS 6.3  --   --   --   --   HGB 13.9 13.0 12.5* 12.6* 13.8  HCT 40.7 38.9* 38.6* 39.0 41.6  MCV 94.0 94.9 96.7 96.5 94.3  PLT 145* 109* 60* 60* 81*   Cardiac Enzymes:  Recent Labs Lab 07/20/17 2015 07/21/17 0508 07/21/17 1127  TROPONINI <0.03 <0.03 0.13*   CBG (last 3)  No results for input(s): GLUCAP in the last 72 hours. Recent Results (from the past 240 hour(s))  MRSA PCR Screening     Status: None   Collection Time: 07/20/17  9:52 PM  Result Value Ref Range Status   MRSA by PCR NEGATIVE NEGATIVE Final    Comment:        The GeneXpert MRSA Assay (FDA approved for NASAL specimens only), is one component of a comprehensive MRSA colonization surveillance program. It is not intended to diagnose MRSA infection nor to guide or monitor treatment for MRSA infections.   Culture, blood (routine x 2)     Status: None (Preliminary result)   Collection Time: 07/21/17 12:24 AM  Result Value Ref Range Status   Specimen Description BLOOD LEFT HAND  Final   Special Requests   Final    BOTTLES DRAWN AEROBIC AND ANAEROBIC Blood Culture adequate volume   Culture NO GROWTH 3 DAYS  Final   Report Status PENDING  Incomplete  Culture, blood (routine x 2)     Status: None (Preliminary result)   Collection Time: 07/21/17  12:32 AM  Result Value Ref Range Status   Specimen Description BLOOD LEFT HAND  Final   Special Requests   Final    BOTTLES DRAWN AEROBIC AND ANAEROBIC Blood Culture adequate volume   Culture NO GROWTH 3 DAYS  Final   Report Status PENDING  Incomplete     Studies: Dg Ercp Biliary & Pancreatic Ducts  Result Date: 07/24/2017 CLINICAL DATA:  71 year old male with a history of choledocholithiasis EXAM: ERCP TECHNIQUE: Multiple spot images obtained with the fluoroscopic device and submitted for interpretation post-procedure. FLUOROSCOPY TIME:  Fluoroscopy Time:  2  minutes 0 seconds COMPARISON:  MRI 07/21/2017 FINDINGS: Limited images during ERCP. Initial image demonstrates endoscope projecting over the upper abdomen with cannulation of the ampulla and partial opacification of the extrahepatic biliary system. Final image demonstrates deployment of a retrieval balloon. IMPRESSION: Limited images during ERCP demonstrates partial opacification of the extrahepatic biliary system with deployment of a retrieval balloon. These images were submitted for radiologic interpretation only. Please see the procedural report for the amount of contrast and the fluoroscopy time utilized. Electronically Signed   By: Gilmer Mor D.O.   On: 07/24/2017 09:21    Scheduled Meds: . amLODipine  5 mg Oral Daily  . amoxicillin-clavulanate  1 tablet Oral Q12H  . folic acid  1 mg Oral Daily  . metoprolol tartrate  50 mg Oral BID  . pantoprazole  40 mg Oral BID AC  . thiamine  100 mg Oral Daily   Continuous Infusions: . sodium chloride 100 mL/hr at 07/24/17 0022    Time spent: 25 minutes.   Vassie Loll, MD [facility] Pager [phone]  If 7PM-7AM, please contact night-coverage [email or web address] Password [email or web address] 07/24/2017, 10:17 PM    LOS: 4 days

## 2017-07-24 NOTE — Anesthesia Procedure Notes (Signed)
Procedure Name: Intubation Date/Time: 07/24/2017 7:49 AM Performed by: Marena Chancy Pre-anesthesia Checklist: Patient identified, Emergency Drugs available, Suction available and Patient being monitored Patient Re-evaluated:Patient Re-evaluated prior to induction Oxygen Delivery Method: Circle System Utilized Preoxygenation: Pre-oxygenation with 100% oxygen Induction Type: IV induction Ventilation: Mask ventilation without difficulty Laryngoscope Size: Miller and 3 Grade View: Grade I Tube type: Oral Tube size: 8.0 mm Number of attempts: 1 Airway Equipment and Method: Stylet and Oral airway Placement Confirmation: ETT inserted through vocal cords under direct vision,  positive ETCO2 and breath sounds checked- equal and bilateral Tube secured with: Tape Dental Injury: Teeth and Oropharynx as per pre-operative assessment

## 2017-07-24 NOTE — Plan of Care (Signed)
Problem: Education: Goal: Knowledge of [facility] Education information/materials will improve Outcome: Progressing Plan of care discussed.  Problem: Safety: Goal: Ability to remain free from injury will improve Outcome: Progressing Bed alarm in use.  Call light and personal items within reach.  Encouraged to call staff for assistance getting out of bed.  Problem: Pain Managment: Goal: General experience of comfort will improve Outcome: Progressing Denies c/o pain or discomfort.

## 2017-07-25 DIAGNOSIS — K219 Gastro-esophageal reflux disease without esophagitis: Secondary | ICD-10-CM

## 2017-07-25 DIAGNOSIS — K263 Acute duodenal ulcer without hemorrhage or perforation: Secondary | ICD-10-CM

## 2017-07-25 LAB — COMPREHENSIVE METABOLIC PANEL
ALBUMIN: 3 g/dL — AB (ref 3.5–5.0)
ALK PHOS: 183 U/L — AB (ref 38–126)
ALT: 238 U/L — AB (ref 17–63)
ANION GAP: 7 (ref 5–15)
AST: 72 U/L — AB (ref 15–41)
BILIRUBIN TOTAL: 2.1 mg/dL — AB (ref 0.3–1.2)
BUN: 13 mg/dL (ref 6–20)
CALCIUM: 8.7 mg/dL — AB (ref 8.9–10.3)
CO2: 24 mmol/L (ref 22–32)
CREATININE: 0.83 mg/dL (ref 0.61–1.24)
Chloride: 106 mmol/L (ref 101–111)
GFR calc Af Amer: >60 mL/min (ref >60)
GFR calc non Af Amer: >60 mL/min (ref >60)
Glucose, Bld: 96 mg/dL (ref 65–99)
Potassium: 3.9 mmol/L (ref 3.5–5.1)
Sodium: 137 mmol/L (ref 135–145)
TOTAL PROTEIN: 5.8 g/dL — AB (ref 6.5–8.1)

## 2017-07-25 LAB — CBC
HEMATOCRIT: 42.9 % (ref 39.0–52.0)
HEMOGLOBIN: 14.2 g/dL (ref 13.0–17.0)
MCH: 31.5 pg (ref 26.0–34.0)
MCHC: 33.1 g/dL (ref 30.0–36.0)
MCV: 95.1 fL (ref 78.0–100.0)
Platelets: 85 10*3/uL — ABNORMAL LOW (ref 150–400)
RBC: 4.51 MIL/uL (ref 4.22–5.81)
RDW: 13.3 % (ref 11.5–15.5)
WBC: 5.2 10*3/uL (ref 4.0–10.5)

## 2017-07-25 MED ORDER — METOPROLOL TARTRATE 50 MG PO TABS
50.0000 mg | ORAL_TABLET | Freq: Two times a day (BID) | ORAL | 1 refills | Status: DC
Start: 2017-07-25 — End: 2017-09-22

## 2017-07-25 MED ORDER — MULTI-VITAMIN/MINERALS PO TABS: 1.0000 | ORAL_TABLET | Freq: Every day | ORAL | 1 refills | Status: AC

## 2017-07-25 MED ORDER — AMLODIPINE BESYLATE 10 MG PO TABS: 10.0000 mg | ORAL_TABLET | Freq: Every day | ORAL | 1 refills | Status: DC

## 2017-07-25 MED ORDER — PANTOPRAZOLE SODIUM 40 MG PO TBEC: 40.0000 mg | DELAYED_RELEASE_TABLET | Freq: Two times a day (BID) | ORAL | 1 refills | Status: DC

## 2017-07-25 MED ORDER — AMOXICILLIN-POT CLAVULANATE 875-125 MG PO TABS: 1.0000 | ORAL_TABLET | Freq: Two times a day (BID) | ORAL | 0 refills | Status: DC

## 2017-07-25 MED ORDER — CLOPIDOGREL BISULFATE 75 MG PO TABS: 75.0000 mg | ORAL_TABLET | Freq: Every day | ORAL | 11 refills | Status: AC

## 2017-07-25 NOTE — Progress Notes (Addendum)
Dustin Clayton 1:19 PM  Subjective: Patient feeling much better and no obvious problems from his ERCP and we again discussed his alcohol intake and he has no new complaints and he is tolerating soft solids  Objective: Vital signs stable afebrile no acute distress liver tests improved abdomen is soft nontender white count okay  Assessment: Status post ERCP and CBD stones removed  Plan: Patient will still need a surgical consult for cholecystectomy at some point and I have encouraged him not to drink so we can be able to see if his liver tests will go back to normal and his platelet count back to normal and I'm happy to see him in the office next week and repeat liver tests then however I am not sure if he needs any further cardiology workup but should be discharged on one Protonix a day for the ulcers I saw and please call me if I could be of any further assistance with this hospital stay  [facility] E  Pager [phone] After 5PM or if no answer call [phone]

## 2017-07-25 NOTE — Discharge Summary (Addendum)
Physician Discharge Summary  Caid Radin QIO:[id number] DOB: 11-19-1945 DOA: 07/20/2017  PCP: No primary care provider on file.  Admit date: 07/20/2017 Discharge date: 07/25/2017  Time spent: 35 minutes minutes  Recommendations for Outpatient Follow-up:  Repeat CMET to follow electrolytes, renal function and LFT's Assist further as needed with alcohol cessation Repeat BP and adjust antihypertensive regimen as needed  Repeat CBC to Hgb and platelets trend .  Discharge Diagnoses:  Principal Problem:   Acute hepatitis Active Problems:   Epigastric pain   Fever   Thrombocytopenia (HCC)   Abnormal LFTs (liver function tests)   Demand ischemia (HCC)   Cholangitis due to bile duct calculus with obstruction   Gastroesophageal reflux disease   Peptic ulcer of duodenum   Discharge Condition: stable and improved. Discharge home with instruction to follow up with PCP in 2 weeks and with GI service in 1 week.  Diet recommendation: low sodium and low fat diet.  Filed Weights   07/24/17 0500 07/24/17 0703 07/25/17 0503  Weight: 101 kg (222 lb 9.6 oz) 100.7 kg (222 lb) 98 kg (216 lb)    History of present illness:  71 year old male with no significant past medical history presented to the ER with complaints of epigastric pain. Patient's pain started today morning 2 hours after breakfast. Pain is mostly epigastric in area than aching nonradiating had associated nausea vomiting no diarrhea. He was admitted for STEMI, taken to cath and it was noted that his LFTs were elevated with fever.  TRH was consulted for elevated LFTs and fever. Scenario presumed to be due to cholangitis and retained stone in CBD. GI on board and contemplating ERCP. Will follow rec's and continue assisting with patient care.      Hospital Course:  1. Elevated liver enzymes/Hyperbilirubinemia/cholangitis and retained stone in CBD- due to retain gallstone in CBD. GI on board and performed successful ERCP with  sphincterotomy and CBD stones extraction on 9/21. Bilirubin and LFT's continue trending down at discharge. Continue holding statins and tylenol. 5 more days of Augmentin at discharge anticipated. Will benefit of cholecystectomy in near future. 2. Thrombocytopenia - most likely associated with recent use of heparin (during cath), had also hx of alcohol abuse and  Acute infection (which can contribute to his low platelets count). Heparin products avoided after low platelets appreciated after cath; also holding ASA. No active bleeding appreciated. At discharge platelets were trending up; recommend CBC at follow up to reassess platelets count. Patient encouraged to stop drinking alcohol. 3. Fever - presumed to be associated with cholangitis. Has remained afebrile after initiation of antibiotics. No abnormalities on CXR, WBC's WNL. Will discharge on Augmentin with plan for 5 more days. patient was on zosyn while inpatient. 4. Hx of CAD/demand ischemia - no CP or SOB. Troponin except for one isolated result (mildly flat elevation) has been neg. Patient without EKG or telemetry changes for ischemia. Left heart cath showed chronic occlusive disease not amenable for PCI. Unable to r/o MI on presentation as per cardiology notes; but felt presentation given other findings not related to acute ischemic process. Will continue medical management as per cardiology rec's. For now due to acute elevation on LFT's will continue holding statins and in presence of low platelets no ASA. at follow up if stable will require resumption of this dugs. continue B-blcker and resume plavix at discharge as clear by GI.  5. Alcohol consumption- no withdrawal appreciated. Cessation counseling provided. Continue thiamine and folic acid. Patient monitored on CIWA protocol while  inpatient. 6. Chronic back pain - stable, and controlled. Continue PRN analgesics. 7. Gastritis and duodenal ulcers: no active bleeding appreciated. Will use PPI daily.  Patient encouraged to stop alcohol use.  Procedures:  See below for x-ray reports  Left heart cath  ERCP and CBD sones removal  Consultations:  Cardiology   GI service  Discharge Exam: Vitals:   07/25/17 0819 07/25/17 0951  BP: (!) 162/80 (!) 162/80  Pulse: 61 61  Resp:    Temp: 98.3 F (36.8 C)   SpO2: 96%    General exam: afebrile, no CP, no nausea, no vomiting. Slightly sleepy after procedure. No icterus, no appreciated jaundice.  Respiratory system: good air movement, no wheezing, no crackles Cardiovascular system: S1 and S2, no rubs, no gallops, no murmurs Gastrointestinal system: soft, NT, ND, positive BS Central nervous system: CN intact, no focal deficit   Extremities: no edema, no cyanosis, no clubbing    Discharge Instructions   Discharge Instructions    Diet - low sodium heart healthy    Complete by:  As directed    Discharge instructions    Complete by:  As directed    Follow heart healthy diet  Take medications as prescribed  Please arrange follow up with PCP in 2 weeks Keep yourself well hydrated Follow up with gastroenterologist as instructed  Avoid the use of NSAID's and stop alcohol intake     Current Discharge Medication List    START taking these medications   Details  amLODipine (NORVASC) 10 MG tablet Take 1 tablet (10 mg total) by mouth daily. Qty: 30 tablet, Refills: 1    amoxicillin-clavulanate (AUGMENTIN) 875-125 MG tablet Take 1 tablet by mouth every 12 (twelve) hours. Qty: 10 tablet, Refills: 0    clopidogrel (PLAVIX) 75 MG tablet Take 1 tablet (75 mg total) by mouth daily. Qty: 30 tablet, Refills: 11    metoprolol tartrate (LOPRESSOR) 50 MG tablet Take 1 tablet (50 mg total) by mouth 2 (two) times daily. Qty: 60 tablet, Refills: 1    Multiple Vitamins-Minerals (MULTIVITAMIN WITH MINERALS) tablet Take 1 tablet by mouth daily. Qty: 30 tablet, Refills: 1    pantoprazole (PROTONIX) 40 MG tablet Take 1 tablet (40 mg total) by  mouth 2 (two) times daily before a meal. Qty: 60 tablet, Refills: 1      CONTINUE these medications which have NOT CHANGED   Details  Cholecalciferol (VITAMIN D3 PO) Take 1 capsule by mouth daily.    Coenzyme Q10 (CO Q 10 PO) Take 1 capsule by mouth daily.    cyclobenzaprine (FLEXERIL) 5 MG tablet Take 5 mg by mouth 3 (three) times daily as needed for muscle spasms.     Flaxseed, Linseed, (FLAX SEED OIL PO) Take 5 mLs by mouth daily.      STOP taking these medications     aspirin EC 81 MG tablet        No Known Allergies Follow-up Information    Vida Rigger, MD Follow up on 07/29/2017.   Specialty:  Gastroenterology Why:  office will contact you with appointment details  Contact information: [address] [place] [address] [phone]            The results of significant diagnostics from this hospitalization (including imaging, microbiology, ancillary and laboratory) are listed below for reference.    Significant Diagnostic Studies: US Abdomen Complete  Result Date: 07/21/2017 CLINICAL DATA:  Elevated liver function tests. EXAM: ABDOMEN ULTRASOUND COMPLETE COMPARISON:  None. FINDINGS:  Gallbladder: Multiple gallstones are noted with the largest measuring 1.3 cm. No significant gallbladder wall thickening or pericholecystic fluid is noted. Sludge is noted as well. No sonographic Murphy's sign is noted. Common bile duct: Diameter: 5.9 mm which is within normal limits. Liver: 1.1 cm simple cyst is noted in left hepatic lobe. Mildly increased echogenicity of hepatic parenchyma is noted suggesting fatty infiltration or hepatocellular disease. Portal vein is patent on color Doppler imaging with normal direction of blood flow towards the liver. IVC: No abnormality visualized. Pancreas: Visualized portion unremarkable. Spleen: Size and appearance within normal limits. Right Kidney: Length: 12.3 cm. Echogenicity within normal limits. No mass or hydronephrosis  visualized. Left Kidney: Length: 13.1 cm. Echogenicity within normal limits. No mass or hydronephrosis visualized. Abdominal aorta: No aneurysm visualized. Other findings: None. IMPRESSION: Cholelithiasis without definite evidence of cholecystitis. Mildly increased echogenicity of hepatic parenchyma is noted suggesting fatty infiltration or diffuse hepatocellular disease. Small left hepatic cyst is noted. Electronically Signed   By: Lupita Raider, M.D.   On: 07/21/2017 09:54   Ct Abdomen Pelvis W Contrast  Result Date: 07/21/2017 CLINICAL DATA:  Diffuse abdominal pain and distention for several days. Elevated liver function tests. Fever. EXAM: CT ABDOMEN AND PELVIS WITH CONTRAST TECHNIQUE: Multidetector CT imaging of the abdomen and pelvis was performed using the standard protocol following bolus administration of intravenous contrast. CONTRAST:  ISOVUE-300 IOPAMIDOL (ISOVUE-300) INJECTION 61% COMPARISON:  07/21/2017 abdominal sonogram. FINDINGS: Lower chest: Mild patchy tree-in-bud opacities in the dependent right lung base. Subsegmental atelectasis in the dependent lung bases. Hepatobiliary: Normal liver size. A few simple lateral segment left liver lobe cysts, largest 2.0 cm. Several subcentimeter hypodense lesions scattered throughout the liver, too small to characterize, for which no follow-up is required unless the patient has risk factors for liver malignancy. Nondistended gallbladder contains multiple calcified gallstones measuring up to 1.8 cm. No definite gallbladder wall thickening or pericholecystic fluid. No significant intrahepatic biliary ductal dilatation. Common bile duct diameter 6 mm, top-normal. There is a 4 mm calcified stone in the lower third of the common bile duct near the ampulla. There is mild haziness of the fat surrounding the common bile duct. Pancreas: Normal, with no mass or duct dilation. Spleen: Normal size. No mass. Adrenals/Urinary Tract: Normal adrenals. Normal  kidneys with no hydronephrosis and no renal mass. Normal bladder. Stomach/Bowel: Small hiatal hernia. Otherwise nondistended and grossly normal stomach. Normal caliber small bowel with no small bowel wall thickening. Appendectomy. Moderate diffuse colonic diverticulosis, most prominent in the sigmoid colon, with no large bowel wall thickening or pericolonic fat stranding. 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 Patent portal, splenic, hepatic and renal veins. No pathologically enlarged lymph nodes in the abdomen or pelvis. Reproductive: Normal size prostate with nonspecific internal prostatic calcifications. Other: No pneumoperitoneum, ascites or focal fluid collection. Musculoskeletal: No aggressive appearing focal osseous lesions. Moderate thoracolumbar spondylosis, most prominent at L5-S1. IMPRESSION: 1. Cholelithiasis.  No CT findings of acute cholecystitis. 2. Solitary 4 mm choledocholith in the lower third of the common bile duct near the ampulla. CBD diameter 6 mm, top-normal. No intrahepatic biliary ductal dilatation. 3. Haziness of the fat surrounding the common bile duct, cannot exclude ascending cholangitis. 4. Mild patchy tree-in-bud opacities at the dependent right lung base, compatible with mild aspiration or infectious bronchiolitis. 5. 3.1 cm Abdominal Aortic Aneurysm (ICD10-I71.9). Recommend follow-up aortic ultrasound in 3 years. This recommendation follows ACR consensus guidelines: White Paper of the ACR Incidental Findings Committee  II on Vascular Findings. J Am Coll Radiol 2013; 10:789-794. 6.  Aortic Atherosclerosis (ICD10-I70.0). 7. Small hiatal hernia. 8. Moderate diffuse colonic diverticulosis. Electronically Signed   By: Delbert Phenix M.D.   On: 07/21/2017 09:48   Mr 3d Recon At Scanner  Result Date: 07/22/2017 CLINICAL DATA:  Cholelithiasis with choledocholithiasis on CT. Abdominal pain, nausea. EXAM: MRI ABDOMEN WITHOUT AND  WITH CONTRAST (INCLUDING MRCP) TECHNIQUE: Multiplanar multisequence MR imaging of the abdomen was performed both before and after the administration of intravenous contrast. Heavily T2-weighted images of the biliary and pancreatic ducts were obtained, and three-dimensional MRCP images were rendered by post processing. CONTRAST:  20mL MULTIHANCE GADOBENATE DIMEGLUMINE 529 MG/ML IV SOLN COMPARISON:  CT abdomen/pelvis dated 07/21/2017 FINDINGS: Motion degraded images. Lower chest: Lung bases are essentially clear. Hepatobiliary: Scattered hepatic cysts, measuring up to 16 mm in the left hepatic lobe (series 7/ image 12). Layering gallstones (series 7/image 20), without associated inflammatory changes. No intrahepatic ductal dilatation.  No peribiliary enhancement. Common duct measures 7 mm, within normal limits. Associated 5 mm distal CBD stone above the ampulla (series 4/ image 21). Pancreas: Trace fluid along the pancreatic tail (series 6/ image 22). No pancreatic ductal dilatation, mass, or atrophy. No peripancreatic fluid collection/pseudocyst. Spleen:  Within normal limits. Adrenals/Urinary Tract:  Adrenal glands within normal limits. Kidneys are within normal limits.  No hydronephrosis. Stomach/Bowel: Stomach is notable for a tiny hiatal hernia. Visualized bowel is notable for colonic diverticulosis. Vascular/Lymphatic:  No evidence of abdominal aortic aneurysm. No suspicious abdominal lymphadenopathy. Other:  No abdominal ascites. Musculoskeletal: No focal osseous lesions. IMPRESSION: Cholelithiasis, without associated inflammatory changes. Common duct measures 7 mm, within normal limits. Associated 5 mm distal CBD stone above the ampulla. Trace fluid along the pancreatic tail. Correlate with laboratory evaluation to assess for acute pancreatitis. No peripancreatic fluid collection/pseudocyst. Electronically Signed   By: Charline Bills M.D.   On: 07/22/2017 13:00   Dg Chest Port 1 View  Result Date:  07/21/2017 CLINICAL DATA:  Fevers EXAM: PORTABLE CHEST 1 VIEW COMPARISON:  01/06/2013 FINDINGS: The heart size and mediastinal contours are within normal limits. Both lungs are clear. The visualized skeletal structures are unremarkable. IMPRESSION: No active disease. Electronically Signed   By: Alcide Clever M.D.   On: 07/21/2017 07:47   Dg Ercp Biliary & Pancreatic Ducts  Result Date: 07/24/2017 CLINICAL DATA:  72 year old male with a history of choledocholithiasis EXAM: ERCP TECHNIQUE: Multiple spot images obtained with the fluoroscopic device and submitted for interpretation post-procedure. FLUOROSCOPY TIME:  Fluoroscopy Time:  2 minutes 0 seconds COMPARISON:  MRI 07/21/2017 FINDINGS: Limited images during ERCP. Initial image demonstrates endoscope projecting over the upper abdomen with cannulation of the ampulla and partial opacification of the extrahepatic biliary system. Final image demonstrates deployment of a retrieval balloon. IMPRESSION: Limited images during ERCP demonstrates partial opacification of the extrahepatic biliary system with deployment of a retrieval balloon. These images were submitted for radiologic interpretation only. Please see the procedural report for the amount of contrast and the fluoroscopy time utilized. Electronically Signed   By: Gilmer Mor D.O.   On: 07/24/2017 09:21   Mr Abdomen Mrcp Vivien Rossetti Contast  Result Date: 07/22/2017 CLINICAL DATA:  Cholelithiasis with choledocholithiasis on CT. Abdominal pain, nausea. EXAM: MRI ABDOMEN WITHOUT AND WITH CONTRAST (INCLUDING MRCP) TECHNIQUE: Multiplanar multisequence MR imaging of the abdomen was performed both before and after the administration of intravenous contrast. Heavily T2-weighted images of the biliary and pancreatic ducts were obtained, and three-dimensional MRCP  images were rendered by post processing. CONTRAST:  20mL MULTIHANCE GADOBENATE DIMEGLUMINE 529 MG/ML IV SOLN COMPARISON:  CT abdomen/pelvis dated 07/21/2017  FINDINGS: Motion degraded images. Lower chest: Lung bases are essentially clear. Hepatobiliary: Scattered hepatic cysts, measuring up to 16 mm in the left hepatic lobe (series 7/ image 12). Layering gallstones (series 7/image 20), without associated inflammatory changes. No intrahepatic ductal dilatation.  No peribiliary enhancement. Common duct measures 7 mm, within normal limits. Associated 5 mm distal CBD stone above the ampulla (series 4/ image 21). Pancreas: Trace fluid along the pancreatic tail (series 6/ image 22). No pancreatic ductal dilatation, mass, or atrophy. No peripancreatic fluid collection/pseudocyst. Spleen:  Within normal limits. Adrenals/Urinary Tract:  Adrenal glands within normal limits. Kidneys are within normal limits.  No hydronephrosis. Stomach/Bowel: Stomach is notable for a tiny hiatal hernia. Visualized bowel is notable for colonic diverticulosis. Vascular/Lymphatic:  No evidence of abdominal aortic aneurysm. No suspicious abdominal lymphadenopathy. Other:  No abdominal ascites. Musculoskeletal: No focal osseous lesions. IMPRESSION: Cholelithiasis, without associated inflammatory changes. Common duct measures 7 mm, within normal limits. Associated 5 mm distal CBD stone above the ampulla. Trace fluid along the pancreatic tail. Correlate with laboratory evaluation to assess for acute pancreatitis. No peripancreatic fluid collection/pseudocyst. Electronically Signed   By: Charline Bills M.D.   On: 07/22/2017 13:00    Microbiology: Recent Results (from the past 240 hour(s))  MRSA PCR Screening     Status: None   Collection Time: 07/20/17  9:52 PM  Result Value Ref Range Status   MRSA by PCR NEGATIVE NEGATIVE Final    Comment:        The GeneXpert MRSA Assay (FDA approved for NASAL specimens only), is one component of a comprehensive MRSA colonization surveillance program. It is not intended to diagnose MRSA infection nor to guide or monitor treatment for MRSA  infections.   Culture, blood (routine x 2)     Status: None (Preliminary result)   Collection Time: 07/21/17 12:24 AM  Result Value Ref Range Status   Specimen Description BLOOD LEFT HAND  Final   Special Requests   Final    BOTTLES DRAWN AEROBIC AND ANAEROBIC Blood Culture adequate volume   Culture NO GROWTH 4 DAYS  Final   Report Status PENDING  Incomplete  Culture, blood (routine x 2)     Status: None (Preliminary result)   Collection Time: 07/21/17 12:32 AM  Result Value Ref Range Status   Specimen Description BLOOD LEFT HAND  Final   Special Requests   Final    BOTTLES DRAWN AEROBIC AND ANAEROBIC Blood Culture adequate volume   Culture NO GROWTH 4 DAYS  Final   Report Status PENDING  Incomplete     Labs: Basic Metabolic Panel:  Recent Labs Lab 07/21/17 0508 07/22/17 0442 07/23/17 0246 07/24/17 0433 07/25/17 0746  NA 134* 139 137 138 137  K 4.0 3.6 4.0 3.3* 3.9  CL 100* 107 107 106 106  CO2 GLUCOSE 131* 102* 95 100* 96  BUN CREATININE 1.20 1.01 0.90 0.81 0.83  CALCIUM 8.3* 8.2* 8.1* 8.6* 8.7*   Liver Function Tests:  Recent Labs Lab 07/21/17 1127 07/22/17 0442 07/23/17 0246 07/24/17 0433 07/25/17 0746  AST 714* 363* 186* 133* 72*  ALT 752* 578* 399* 331* 238*  ALKPHOS 91 88 128* 223* 183*  BILITOT 4.2* 3.7* 4.0* 4.9* 2.1*  PROT 5.5* 5.5* 5.4* 5.8* 5.8*  ALBUMIN 3.1* 2.9* 2.8* 2.8*  3.0*    Recent Labs Lab 07/20/17 2015  LIPASE 29   No results for input(s): AMMONIA in the last 168 hours. CBC:  Recent Labs Lab 07/20/17 2015 07/21/17 0508 07/22/17 0442 07/23/17 0246 07/24/17 0433 07/25/17 0746  WBC 7.1 8.5 4.7 5.1 4.4 5.2  NEUTROABS 6.3  --   --   --   --   --   HGB 13.9 13.0 12.5* 12.6* 13.8 14.2  HCT 40.7 38.9* 38.6* 39.0 41.6 42.9  MCV 94.0 94.9 96.7 96.5 94.3 95.1  PLT 145* 109* 60* 60* 81* 85*   Cardiac Enzymes:  Recent Labs Lab 07/20/17 2015 07/21/17 0508 07/21/17 1127  TROPONINI <0.03 <0.03  0.13*    Signed:  Vassie Loll MD.  [facility] 07/25/2017, 2:52 PM

## 2017-07-26 ENCOUNTER — Encounter (HOSPITAL_COMMUNITY): Payer: Self-pay | Admitting: Gastroenterology

## 2017-07-26 LAB — CULTURE, BLOOD (ROUTINE X 2)
CULTURE: NO GROWTH
Culture: NO GROWTH
SPECIAL REQUESTS: ADEQUATE
Special Requests: ADEQUATE

## 2017-09-15 DIAGNOSIS — I10 Essential (primary) hypertension: Secondary | ICD-10-CM | POA: Insufficient documentation

## 2017-09-15 DIAGNOSIS — I519 Heart disease, unspecified: Secondary | ICD-10-CM | POA: Insufficient documentation

## 2017-09-19 ENCOUNTER — Encounter: Payer: Self-pay | Admitting: Cardiology

## 2017-09-19 NOTE — Progress Notes (Signed)
Cardiology Office Note   Date:  09/22/2017   ID:  Dustin Clayton, DOB 09-Jul-1946, MRN [id number]  PCP:  Angelica Chessman, MD  Cardiologist:   Dustin Swaziland, MD   Chief Complaint  Patient presents with  . Coronary Artery Disease      History of Present Illness: Dustin Clayton is a 71 y.o. male who presents for post hospital follow up. He was admitted from 9/17-9/22/18. He presented with acute epigastric pain, nausea, fever, and weakness. Initial Ecg showed ? ST elevation in the septal leads and code STEMI was activated. Troponin was mildly elevated. He underwent emergent cardiac cath that showed chronic occlusion of the distal LCX. There was 60-70% ostial first diagonal stenosis. No clear culprit. EF was normal by cath and Echo. It was felt that troponin elevation was due to demand ischemia and that his presentation was not primarily cardiac. Review of Ecg showed really no change compared to older Ecgs. He had elevated LFTs and underwent ERCP with findings of retained CBD stone. He had ERCP with sphincterotomy. LFTs trended down. Surgical consult recommended as outpatient. He also had gastritis and duodenal ulcers without active bleeding- treated with PPI. He has a history of Etoh abuse and abstinence recommended. Statins were held due to elevated LFTs. Thrombocytopenia also noted but improved.   On follow up today he is doing well. He denies any chest pain, abdominal pain, nausea, fever. Reports protonix reduced to once a day by Dr. Ewing Schlein. Surgical evaluation not yet scheduled. Reports LFTs have returned to normal but I don't have those results. Energy level is good.     Past Medical History:  Diagnosis Date  . Acute cholangitis   . Alcohol abuse   . CAD (coronary artery disease), native coronary artery   . Common bile duct calculus   . Duodenal ulcer   . Gastritis   . HTN (hypertension)   . Hypercholesterolemia   . Thrombocytopenia (HCC)     Past Surgical History:    Procedure Laterality Date  . APPENDECTOMY    . ENDOSCOPIC RETROGRADE CHOLANGIOPANCREATOGRAPHY (ERCP) N/A 07/24/2017   Performed by Vida Rigger, MD at [facility] ENDOSCOPY  . EYE SURGERY    . LEFT HEART CATH AND CORONARY ANGIOGRAPHY N/A 07/20/2017   Performed by Kathleene Hazel, MD at [facility] INVASIVE CV LAB  . TONSILLECTOMY       Current Outpatient Medications  Medication Sig Dispense Refill  . amLODipine (NORVASC) 10 MG tablet Take 1 tablet (10 mg total) daily by mouth. 90 tablet 3  . Cholecalciferol (VITAMIN D3 PO) Take 1 capsule by mouth daily.    . clopidogrel (PLAVIX) 75 MG tablet Take 1 tablet (75 mg total) by mouth daily. 30 tablet 11  . Coenzyme Q10 (CO Q 10 PO) Take 1 capsule by mouth daily.    . cyclobenzaprine (FLEXERIL) 5 MG tablet Take 5 mg by mouth 3 (three) times daily as needed for muscle spasms.     . Flaxseed, Linseed, (FLAX SEED OIL PO) Take 5 mLs by mouth daily.    . metoprolol tartrate (LOPRESSOR) 50 MG tablet Take 1 tablet (50 mg total) 2 (two) times daily by mouth. 180 tablet 3  . Multiple Vitamins-Minerals (MULTIVITAMIN WITH MINERALS) tablet Take 1 tablet by mouth daily. 30 tablet 1  . pantoprazole (PROTONIX) 40 MG tablet Take 1 tablet (40 mg total) by mouth 2 (two) times daily before a meal. 60 tablet 1   No current facility-administered medications for this visit.  Allergies:   Patient has no known allergies.    Social History:  The patient  reports that he quit smoking about 32 years ago. he has never used smokeless tobacco. He reports that he drinks about 8.4 oz of alcohol per week. He reports that he does not use drugs.   Family History:  [name].    ROS:  Please see the history of present illness.   Otherwise, review of systems are positive for none.   All other systems are reviewed and negative.    PHYSICAL EXAM: VS:  BP 120/64   Pulse 80   Ht 5\' 11"  (1.803 m)   Wt 214 lb 12.8 oz (97.4  kg)   BMI 29.96 kg/m  , BMI Body mass index is 29.96 kg/m. GEN: Well nourished, well developed, in no acute distress  HEENT: normal  Neck: no JVD, carotid bruits, or masses Cardiac: RRR; no murmurs, rubs, or gallops,no edema  Respiratory:  clear to auscultation bilaterally, normal work of breathing GI: soft, nontender, nondistended, + BS MS: no deformity or atrophy  Skin: warm and dry, no rash Neuro:  Strength and sensation are intact Psych: euthymic mood, full affect   EKG:  EKG is not ordered today. The ekg ordered today demonstrates N/A   Recent Labs: 07/25/2017: ALT 238; BUN 13; Creatinine, Ser 0.83; Hemoglobin 14.2; Platelets 85; Potassium 3.9; Sodium 137    Lipid Panel    Component Value Date/Time   CHOL 202 (H) 07/20/2017 2015   TRIG 80 07/20/2017 2015   HDL 72 07/20/2017 2015   CHOLHDL 2.8 07/20/2017 2015   VLDL 16 07/20/2017 2015   LDLCALC 114 (H) 07/20/2017 2015      Wt Readings from Last 3 Encounters:  09/22/17 214 lb 12.8 oz (97.4 kg)  07/25/17 216 lb (98 kg)  01/06/13 225 lb (102.1 kg)      Other studies Reviewed: Additional studies/ records that were reviewed today include:   Echo: 07/21/17: Study Conclusions  - Left ventricle: The cavity size was normal. There was moderate   concentric hypertrophy. Systolic function was normal. The   estimated ejection fraction was in the range of 55% to 60%. Wall   motion was normal; there were no regional wall motion   abnormalities. Doppler parameters are consistent with abnormal   left ventricular relaxation (grade 1 diastolic dysfunction). - Left atrium: The atrium was mildly dilated. - Right ventricle: The cavity size was mildly dilated. - Atrial septum: No defect or patent foramen ovale was identified.  Cardiac cath: 07/20/17: Procedures   LEFT HEART CATH AND CORONARY ANGIOGRAPHY  Conclusion     Mid Cx to Dist Cx lesion, 100 %stenosed.  Mid RCA lesion, 20 %stenosed.  Prox Cx lesion, 20  %stenosed.  Ost 1st Diag to 1st Diag lesion, 60 %stenosed.  Prox LAD lesion, 20 %stenosed.  The left ventricular systolic function is normal.  LV end diastolic pressure is normal.  The left ventricular ejection fraction is greater than 65% by visual estimate.  There is no mitral valve regurgitation.   1. There is right to left filling of a small lateral wall branch. This appears to be a total occlusion of the distal Circumflex where the vessel becomes very small in caliber but cannot totally exclude this being a small Diagonal branch. The distal AV groove Circumflex changes quickly to a small caliber vessel with a lateral branch. This could the site of total occlusion. The vascular  territory supplied by this branch is very small.  2. Moderate stenosis in the ostium of the large first Diagonal branch with excellent flow down the vessel. This vessel represents a dual LAD. This lesion does not appear to be ulcerated and doe not appear to be flow limiting.  3. Mild stenosis in the proximal LAD. The mid LAD is tortuous and there is subtle dye staining in this bend with quick dye washout. The LAD continues to the apex.  4. The RCA is a large dominant vessel with mild plaque. The distal right gives off a collateral to a very small lateral wall vessel.  5. Normal LV systolic function 6. 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 As above, LBBB induced with the placement of the pigtail catheter in the LV. Echo in am. It is unclear if his presentation is due to the occlusion of the small lateral wall branch. He is at the completion of the cath not having any symptoms.        ASSESSMENT AND PLAN:  1.  CAD. Cardiac cath shows moderate ostial diagonal disease and chronic occlusion of a small distal LCx vessel. He has no angina. Overall low risk. If cholecystectomy is required he is a suitable  surgical risk and could stop Plavix one week prior. Need to focus on risk factor modification with BP and lipid control and healthy lifestyle including regular aerobic exercise and healthy diet. When OK from Dr. Ewing SchleinMagod I would switch Plavix to ASA 81 mg daily.  2. Hypercholesterolemia. I would recommend initiation of a statin drug when felt to be OK from a liver standpoint. Will defer to Dr. Riley NearingAguiar.  3. HTN - well controlled on amlodipine and metoprolol. Continue. 4. Cholangitis due to retained CBD stone. S/p ERCP with resolution of symptoms. 5. Gastritis/duodenitis.    Current medicines are reviewed at length with the patient today.  The patient does not have concerns regarding medicines.  The following changes have been made:  no change  Labs/ tests ordered today include: none No orders of the defined types were placed in this encounter.    Disposition:   FU with me in 1 year  Signed, Dustin SwazilandJordan, MD  09/22/2017 8:21 AM    [facility] [address], [place], [place], [id number] Phone [phone], Fax [phone]

## 2017-09-22 ENCOUNTER — Encounter: Payer: Self-pay | Admitting: Cardiology

## 2017-09-22 ENCOUNTER — Ambulatory Visit: Payer: BC Managed Care – PPO | Admitting: Cardiology

## 2017-09-22 VITALS — BP 120/64 | HR 80 | Ht 71.0 in | Wt 214.8 lb

## 2017-09-22 DIAGNOSIS — E7800 Pure hypercholesterolemia, unspecified: Secondary | ICD-10-CM

## 2017-09-22 DIAGNOSIS — I10 Essential (primary) hypertension: Secondary | ICD-10-CM

## 2017-09-22 DIAGNOSIS — I2510 Atherosclerotic heart disease of native coronary artery without angina pectoris: Secondary | ICD-10-CM

## 2017-09-22 MED ORDER — AMLODIPINE BESYLATE 10 MG PO TABS: 10.0000 mg | ORAL_TABLET | Freq: Every day | ORAL | 3 refills | Status: DC

## 2017-09-22 MED ORDER — METOPROLOL TARTRATE 50 MG PO TABS: 50.0000 mg | ORAL_TABLET | Freq: Two times a day (BID) | ORAL | 3 refills | Status: DC

## 2017-09-22 NOTE — Patient Instructions (Signed)
Continue your current therapy  I will see you in one year   

## 2017-09-29 ENCOUNTER — Other Ambulatory Visit: Payer: Self-pay | Admitting: Cardiology

## 2017-09-29 ENCOUNTER — Telehealth: Payer: Self-pay | Admitting: Cardiology

## 2017-09-29 MED ORDER — AMLODIPINE BESYLATE 10 MG PO TABS: 10.0000 mg | ORAL_TABLET | Freq: Every day | ORAL | 3 refills | Status: DC

## 2017-09-29 MED ORDER — METOPROLOL TARTRATE 50 MG PO TABS: 50.0000 mg | ORAL_TABLET | Freq: Two times a day (BID) | ORAL | 3 refills | Status: DC

## 2017-09-29 NOTE — Telephone Encounter (Signed)
Rx(s) sent to pharmacy electronically.  

## 2017-09-29 NOTE — Telephone Encounter (Signed)
New message     *STAT* If patient is at the pharmacy, call can be transferred to refill team.   1. Which medications need to be refilled? (please list name of each medication and dose if known) amLODipine (NORVASC) 10 MG tablet AND metoprolol tartrate (LOPRESSOR) 50 MG tablet  2. Which pharmacy/location (including street and city if local pharmacy) is medication to be sent to? [facility]  3. Do they need a 30 day or 90 day supply? 30

## 2017-09-29 NOTE — Telephone Encounter (Signed)
[facility] needs ov notes from 09-22-17 so they can close out their referral-fax to Att: French Anaracy [phone]

## 2017-11-30 DIAGNOSIS — M62830 Muscle spasm of back: Secondary | ICD-10-CM | POA: Insufficient documentation

## 2019-08-23 ENCOUNTER — Telehealth: Payer: Self-pay | Admitting: Cardiology

## 2019-08-23 NOTE — Telephone Encounter (Signed)
Attempted to speak with pt to schedule appt with Dr. Martinique. Pt hung up.

## 2020-10-05 ENCOUNTER — Ambulatory Visit: Payer: BC Managed Care – PPO | Attending: Internal Medicine

## 2020-10-05 DIAGNOSIS — Z23 Encounter for immunization: Secondary | ICD-10-CM

## 2020-10-05 NOTE — Progress Notes (Signed)
   Covid-19 Vaccination Clinic  Name:  Dustin Clayton    MRN: [id number] DOB: 09/10/1946  10/05/2020  [name] was observed post Covid-19 immunization for 15 minutes without incident. He was provided with Vaccine Information Sheet and instruction to access the V-Safe system.   [name] was instructed to call 911 with any severe reactions post vaccine: Marland Kitchen Difficulty breathing  . Swelling of face and throat  . A fast heartbeat  . A bad rash all over body  . Dizziness and weakness   Immunizations Administered    No immunizations on file.

## 2021-04-16 ENCOUNTER — Telehealth: Payer: Self-pay | Admitting: *Deleted

## 2021-04-16 NOTE — Telephone Encounter (Signed)
Patient's notes are on file...Marland KitchenMarland Kitchenreferred [facility] Wynn Banker, MD [phone]

## 2021-05-30 ENCOUNTER — Telehealth: Payer: Self-pay | Admitting: Cardiology

## 2021-05-30 NOTE — Telephone Encounter (Signed)
Returned call to patient no answer.Left message on personal voice mail to call back. 

## 2021-05-30 NOTE — Telephone Encounter (Signed)
Received a call back from patient stated his PCP Dr.Aquiar wanted him to see Dr.Jordan.Appointment scheduled with Dr.Jordan 06/19/21 at 10:30 am.

## 2021-05-30 NOTE — Telephone Encounter (Signed)
Pt is returning a call  

## 2021-06-13 NOTE — Progress Notes (Signed)
Cardiology Office Note   Date:  06/19/2021   ID:  Dustin Clayton, DOB 03-17-1946, MRN [id number]  PCP:  Dustin Chessman, MD  Cardiologist:   Dustin Huesman Swaziland, MD   Chief Complaint  Patient presents with   Coronary Artery Disease   Atrial Fibrillation       History of Present Illness: Dustin Clayton is a 75 y.o. male who is seen at the request of Dr Dustin Clayton for evaluation of CAD. He was last seen by me in November 2018. He was admitted from 9/17-9/22/18. He presented with acute epigastric pain, nausea, fever, and weakness. Initial Ecg showed ? ST elevation in the septal leads and code STEMI was activated. Troponin was mildly elevated. He underwent emergent cardiac cath that showed chronic occlusion of the distal LCX. There was 60-70% ostial first diagonal stenosis. No clear culprit. EF was normal by cath and Echo. It was felt that troponin elevation was due to demand ischemia and that his presentation was not primarily cardiac. Review of Ecg showed really no change compared to older Ecgs. He had elevated LFTs and underwent ERCP with findings of retained CBD stone. He had ERCP with sphincterotomy. LFTs trended down. Surgical consult recommended as outpatient. He also had gastritis and duodenal ulcers without active bleeding- treated with PPI. He has a history of Etoh abuse and abstinence recommended. Statins were held due to elevated LFTs. Thrombocytopenia also noted but improved.   He reports he has done well since 2018. Never required surgery for his gallbladder. Denies any chest pain. Has some DOE. No palpitations, dizziness or edema. On recent physical in June Ecg showed Afib with rate 87 and a LBBB. No bleeding history. No history of CVA. Was recently prescribed atorvastatin but hasn't started it yet.     Past Medical History:  Diagnosis Date   Acute cholangitis    Alcohol abuse    Arrhythmia    CAD (coronary artery disease), native coronary artery    Common bile duct calculus     Duodenal ulcer    Gastritis    HTN (hypertension)    Hypercholesterolemia    Thrombocytopenia (HCC)     Past Surgical History:  Procedure Laterality Date   APPENDECTOMY     ERCP N/A 07/24/2017   Procedure: ENDOSCOPIC RETROGRADE CHOLANGIOPANCREATOGRAPHY (ERCP);  Surgeon: Vida Rigger, MD;  Location: [facility] ENDOSCOPY;  Service: Endoscopy;  Laterality: N/A;   EYE SURGERY     LEFT HEART CATH AND CORONARY ANGIOGRAPHY N/A 07/20/2017   Procedure: LEFT HEART CATH AND CORONARY ANGIOGRAPHY;  Surgeon: Kathleene Hazel, MD;  Location: [facility] INVASIVE CV LAB;  Service: Cardiovascular;  Laterality: N/A;   TONSILLECTOMY       Current Outpatient Medications  Medication Sig Dispense Refill   amLODipine (NORVASC) 10 MG tablet Take 1 tablet (10 mg total) by mouth daily. 90 tablet 3   apixaban (ELIQUIS) 5 MG TABS tablet Take 1 tablet (5 mg total) by mouth 2 (two) times daily. 180 tablet 3   atorvastatin (LIPITOR) 20 MG tablet Take 1 tablet (20 mg total) by mouth daily. 90 tablet 3   Cholecalciferol (VITAMIN D3 PO) Take 1 capsule by mouth daily.     Coenzyme Q10 (CO Q 10 PO) Take 1 capsule by mouth daily.     cyclobenzaprine (FLEXERIL) 5 MG tablet Take 5 mg by mouth 3 (three) times daily as needed for muscle spasms.      Flaxseed, Linseed, (FLAX SEED OIL PO) Take 5 mLs by mouth daily.  metoprolol tartrate (LOPRESSOR) 50 MG tablet Take 1 tablet (50 mg total) by mouth 2 (two) times daily. 180 tablet 3   No current facility-administered medications for this visit.    Allergies:   Patient has no known allergies.    Social History:  The patient  reports that he quit smoking about 36 years ago. His smoking use included cigarettes. He has never used smokeless tobacco. He reports current alcohol use of about 15.0 standard drinks per week. He reports that he does not use drugs.   Family History:  The patient's family history includes Cancer in his mother; Prostate cancer in his brother.    ROS:  Please see  the history of present illness.   Otherwise, review of systems are positive for none.   All other systems are reviewed and negative.    PHYSICAL EXAM: VS:  BP (!) 149/73   Pulse 77   Ht 5' 10.75" (1.797 m)   Wt 227 lb (103 kg)   SpO2 95%   BMI 31.88 kg/m  , BMI Body mass index is 31.88 kg/m. GEN: Well nourished, well developed, in no acute distress  HEENT: normal  Neck: no JVD, carotid bruits, or masses Cardiac: RRR; no murmurs, rubs, or gallops,no edema  Respiratory:  clear to auscultation bilaterally, normal work of breathing GI: soft, nontender, nondistended, + BS MS: no deformity or atrophy  Skin: warm and dry, no rash Neuro:  Strength and sensation are intact Psych: euthymic mood, full affect   EKG:  EKG is not ordered today. The ekg ordered 04/11/21 demonstrates Afib rate 87. LBBB. I have personally reviewed and interpreted this study.    Recent Labs: No results found for requested labs within last 8760 hours.    Lipid Panel    Component Value Date/Time   CHOL 202 (H) 07/20/2017 2015   TRIG 80 07/20/2017 2015   HDL 72 07/20/2017 2015   CHOLHDL 2.8 07/20/2017 2015   VLDL 16 07/20/2017 2015   LDLCALC 114 (H) 07/20/2017 2015    Labs dated 04/11/21: cholesterol 224, triglycerides 82, LDL 128, HDL 80. CMET and CBC normal.  Wt Readings from Last 3 Encounters:  06/19/21 227 lb (103 kg)  09/22/17 214 lb 12.8 oz (97.4 kg)  07/25/17 216 lb (98 kg)      Other studies Reviewed: Additional studies/ records that were reviewed today include:   Echo: 07/21/17: Study Conclusions   - Left ventricle: The cavity size was normal. There was moderate   concentric hypertrophy. Systolic function was normal. The   estimated ejection fraction was in the range of 55% to 60%. Wall   motion was normal; there were no regional wall motion   abnormalities. Doppler parameters are consistent with abnormal   left ventricular relaxation (grade 1 diastolic dysfunction). - Left atrium: The  atrium was mildly dilated. - Right ventricle: The cavity size was mildly dilated. - Atrial septum: No defect or patent foramen ovale was identified.  Cardiac cath: 07/20/17: Procedures   LEFT HEART CATH AND CORONARY ANGIOGRAPHY  Conclusion     Mid Cx to Dist Cx lesion, 100 %stenosed. Mid RCA lesion, 20 %stenosed. Prox Cx lesion, 20 %stenosed. Ost 1st Diag to 1st Diag lesion, 60 %stenosed. Prox LAD lesion, 20 %stenosed. The left ventricular systolic function is normal. LV end diastolic pressure is normal. The left ventricular ejection fraction is greater than 65% by visual estimate. There is no mitral valve regurgitation.   1. There is right to left filling of a  small lateral wall branch. This appears to be a total occlusion of the distal Circumflex where the vessel becomes very small in caliber but cannot totally exclude this being a small Diagonal branch. The distal AV groove Circumflex changes quickly to a small caliber vessel with a lateral branch. This could the site of total occlusion. The vascular territory supplied by this branch is very small.  2. Moderate stenosis in the ostium of the large first Diagonal branch with excellent flow down the vessel. This vessel represents a dual LAD. This lesion does not appear to be ulcerated and doe not appear to be flow limiting.  3. Mild stenosis in the proximal LAD. The mid LAD is tortuous and there is subtle dye staining in this bend with quick dye washout. The LAD continues to the apex.  4. The RCA is a large dominant vessel with mild plaque. The distal right gives off a collateral to a very small lateral wall vessel.  5. Normal LV systolic function 6. 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 As above, LBBB induced with the placement of the pigtail catheter in the LV. Echo in am. It is unclear if his presentation is due to the  occlusion of the small lateral wall branch. He is at the completion of the cath not having any symptoms.         ASSESSMENT AND PLAN:  1.  CAD. Cardiac cath in 2018 showed moderate ostial diagonal disease and chronic occlusion of a small distal LCx vessel. He has no angina. Overall low risk.  2. Hypercholesterolemia. I agree with  initiation of atorvastatin 20 mg daily as prescribed by Dr Dustin Clayton. Would repeat labs in 3 months. Goal LDL < 70.  3. HTN - well controlled on amlodipine and metoprolol. Continue. 4. Cholangitis due to retained CBD stone. S/p ERCP with resolution of symptoms. 5. Atrial fibrillation persistent. Rate controlled on metoprolol and his is minimally symptomatic. Likely will manage with rate control alone. [place] vasc score is 3. Recommend initiation of Elquis 5 mg bid. Discussed benefits of stroke reduction with Afib which outweigh risk of bleeding. Should avoid ASA or NSAIDs. Will arrange for an Echocardiogram.   Current medicines are reviewed at length with the patient today.  The patient does not have concerns regarding medicines.  The following changes have been made:  start lipitor 20 mg daily. Start Eliquis 5 mg bid.   Labs/ tests ordered today include: Echo  Follow up in 3 months.

## 2021-06-19 ENCOUNTER — Other Ambulatory Visit: Payer: Self-pay

## 2021-06-19 ENCOUNTER — Encounter: Payer: Self-pay | Admitting: Cardiology

## 2021-06-19 ENCOUNTER — Ambulatory Visit (INDEPENDENT_AMBULATORY_CARE_PROVIDER_SITE_OTHER): Payer: Medicare Other | Admitting: Cardiology

## 2021-06-19 VITALS — BP 149/73 | HR 77 | Ht 70.75 in | Wt 227.0 lb

## 2021-06-19 DIAGNOSIS — I25118 Atherosclerotic heart disease of native coronary artery with other forms of angina pectoris: Secondary | ICD-10-CM | POA: Diagnosis not present

## 2021-06-19 DIAGNOSIS — I4819 Other persistent atrial fibrillation: Secondary | ICD-10-CM | POA: Diagnosis not present

## 2021-06-19 DIAGNOSIS — E7800 Pure hypercholesterolemia, unspecified: Secondary | ICD-10-CM

## 2021-06-19 DIAGNOSIS — I10 Essential (primary) hypertension: Secondary | ICD-10-CM

## 2021-06-19 DIAGNOSIS — I447 Left bundle-branch block, unspecified: Secondary | ICD-10-CM

## 2021-06-19 MED ORDER — ATORVASTATIN CALCIUM 20 MG PO TABS: 20.0000 mg | ORAL_TABLET | Freq: Every day | ORAL | 3 refills | Status: DC

## 2021-06-19 MED ORDER — APIXABAN 5 MG PO TABS: 5.0000 mg | ORAL_TABLET | Freq: Two times a day (BID) | ORAL | 3 refills | Status: DC

## 2021-06-19 NOTE — Patient Instructions (Signed)
Start atorvastatin 20 mg a day as Dr Riley Nearing recommended. You should have blood work repeated in 3 months with goal LDL less than 70.  Start Eliquis 5 mg twice a day to reduce risk of stroke with Afib  Avoid ASA or nonsteroidal anti-inflammatory drugs like Advil or Aleve. OK to use Tylenol\  We will schedule you for an Echocardiogram.

## 2021-07-11 ENCOUNTER — Other Ambulatory Visit: Payer: Self-pay

## 2021-07-11 ENCOUNTER — Ambulatory Visit (HOSPITAL_COMMUNITY): Payer: Medicare Other | Attending: Cardiology

## 2021-07-11 DIAGNOSIS — I10 Essential (primary) hypertension: Secondary | ICD-10-CM | POA: Diagnosis not present

## 2021-07-11 DIAGNOSIS — I447 Left bundle-branch block, unspecified: Secondary | ICD-10-CM | POA: Insufficient documentation

## 2021-07-11 DIAGNOSIS — E7800 Pure hypercholesterolemia, unspecified: Secondary | ICD-10-CM | POA: Insufficient documentation

## 2021-07-11 DIAGNOSIS — I4819 Other persistent atrial fibrillation: Secondary | ICD-10-CM | POA: Insufficient documentation

## 2021-07-11 DIAGNOSIS — I25118 Atherosclerotic heart disease of native coronary artery with other forms of angina pectoris: Secondary | ICD-10-CM | POA: Insufficient documentation

## 2021-07-11 LAB — ECHOCARDIOGRAM COMPLETE: S' Lateral: 4 cm

## 2021-07-16 ENCOUNTER — Other Ambulatory Visit: Payer: Self-pay

## 2021-07-16 DIAGNOSIS — I10 Essential (primary) hypertension: Secondary | ICD-10-CM

## 2021-07-16 DIAGNOSIS — I4819 Other persistent atrial fibrillation: Secondary | ICD-10-CM

## 2021-07-16 MED ORDER — ENTRESTO 24-26 MG PO TABS: 1.0000 | ORAL_TABLET | Freq: Two times a day (BID) | ORAL | 6 refills | Status: DC

## 2021-07-19 ENCOUNTER — Other Ambulatory Visit: Payer: Self-pay

## 2021-08-02 DIAGNOSIS — M1990 Unspecified osteoarthritis, unspecified site: Secondary | ICD-10-CM | POA: Insufficient documentation

## 2021-08-06 ENCOUNTER — Ambulatory Visit (INDEPENDENT_AMBULATORY_CARE_PROVIDER_SITE_OTHER): Payer: Medicare Other | Admitting: Pharmacist Clinician (PhC)/ Clinical Pharmacy Specialist

## 2021-08-06 ENCOUNTER — Other Ambulatory Visit: Payer: Self-pay

## 2021-08-06 DIAGNOSIS — I509 Heart failure, unspecified: Secondary | ICD-10-CM | POA: Diagnosis not present

## 2021-08-06 DIAGNOSIS — E785 Hyperlipidemia, unspecified: Secondary | ICD-10-CM

## 2021-08-06 DIAGNOSIS — I25118 Atherosclerotic heart disease of native coronary artery with other forms of angina pectoris: Secondary | ICD-10-CM

## 2021-08-06 NOTE — Progress Notes (Signed)
08/12/2021 Dustin Clayton 04/24/1946 [id number]   HPI:  Dustin Clayton is a [facility]ion [facility]4 y.o. male patient of Dr Swaziland, with a PMH below who presents today for heart failure medication titration.  He saw Dr. Swaziland in August, at which time his pressure was noted to be 149/73.   No changes were made at that time, however he did have an echocardiogram scheduled for 07-11-21.  This showed EF has dropped to 45-50% when compared to a previous echo in 2018 with EF at 55-60%.   He was asked to start Entresto 24/26 mg and follow up with CVRR in 2 weeks.    Today he is in the office with his wife.  He notes that he has not started Entresto due to it not being covered on his insurance.  States that in the past month or so he has developed severe arthritis that is limiting his activities.    Past Medical History: CAD 2018 cath - mid to distal Cx 100% stenosed, dominant RCA  hyperlipidemia Started atorvastatin 20 mg - PCP  hypertension Has been controlled on amlodipine and metoprolol  AF Persistent, rate controlled with metoprolol, CHADS2-VASc score 3, on Eliquis     Blood Pressure Goal:  130/80  Current Medications: amlodipine 10 mg, metoprolol tart 50 bid, Entresto 24/26  Social Hx: no tobacco in 36 years; cut out alcohol 3 weeks ago;  did drop 16 pound, 20+ by home scale; black coffee most days  Diet: does not eat out in general, wife does most cooking;  very little red meat, eats salads most day  Home BP readings:  only has a few readings with him today: 112/62 106/69   113/82  102/68  97/65  97/63  Intolerances: nkda  Labs: 9/22:  Na 137, K 4.6, Glu 112, BUN 15, SCr 0.9, GFR 88   Wt Readings from Last 3 Encounters:  08/06/21 213 lb (96.6 kg)  06/19/21 227 lb (103 kg)  09/22/17 214 lb 12.8 oz (97.4 kg)   BP Readings from Last 3 Encounters:  08/06/21 102/60  06/19/21 (!) 149/73  09/22/17 120/64   Pulse Readings from Last 3 Encounters:  08/06/21 90  06/19/21 77  09/22/17 80     Current Outpatient Medications  Medication Sig Dispense Refill   acetaminophen (TYLENOL 8 HOUR) 650 MG CR tablet Take 650 mg by mouth every 8 (eight) hours as needed for pain.     amLODipine (NORVASC) 10 MG tablet Take 1 tablet (10 mg total) by mouth daily. 90 tablet 3   apixaban (ELIQUIS) 5 MG TABS tablet Take 1 tablet (5 mg total) by mouth 2 (two) times daily. 180 tablet 3   atorvastatin (LIPITOR) 20 MG tablet Take 1 tablet (20 mg total) by mouth daily. 90 tablet 3   Cholecalciferol (VITAMIN D3 PO) Take 1 capsule by mouth daily.     Coenzyme Q10 (CO Q 10 PO) Take 1 capsule by mouth daily.     cyclobenzaprine (FLEXERIL) 5 MG tablet Take 5 mg by mouth 3 (three) times daily as needed for muscle spasms.      metoprolol tartrate (LOPRESSOR) 50 MG tablet Take 1 tablet (50 mg total) by mouth 2 (two) times daily. 180 tablet 3   Flaxseed, Linseed, (FLAX SEED OIL PO) Take 5 mLs by mouth daily. (Patient not taking: Reported on 08/06/2021)     sacubitril-valsartan (ENTRESTO) 24-26 MG Take 1 tablet by mouth 2 (two) times daily. 60 tablet 6   No current facility-administered  medications for this visit.    No Known Allergies  Past Medical History:  Diagnosis Date   Acute cholangitis    Alcohol abuse    Arrhythmia    CAD (coronary artery disease), native coronary artery    Common bile duct calculus    Duodenal ulcer    Gastritis    HTN (hypertension)    Hypercholesterolemia    Thrombocytopenia (HCC)     Blood pressure 102/60, pulse 90, resp. rate 16, height 5' 10.75" (1.797 m), weight 213 lb (96.6 kg), SpO2 98 %.  HF (heart failure) (HCC) Patient with new onset HF, EF at 45-50%.  Was given Sherryll Burger rx, however believed that it was not covered by insurance when saw price at pharmacy.  Will start today, however with BP only at 102/60, will have him cut the amlodipine from 10 to 5 mg daily.  He will start the [facility] tomorrow, after cutting the amlodipine, to avoid any hypotensive issues.  He  was given a 30 day free sample card for the medication as well as the contact information to reach out to the manufacturer for assistance.   He will need to repeat labs in 2 weeks and keep his scheduled appointment with Dr. Swaziland in 4 weeks.   Hyperlipidemia Patient has noticed new onset arthritis in the past few weeks.  Suspect this could be attributed to his atorvastatin, which was started in August.  Advised that he discontinue medication for 2 weeks and determine if this is truly the cause.  If so, he can let Dr. Swaziland know when he returns in November.  If this was the cause, would suggest trying rosuvastatin 5 mg 2-3 times per week at first to determine what he is able to tolerate.     Phillips Hay PharmD CPP [facility] [address] [place], [place] [address] [phone]

## 2021-08-06 NOTE — Patient Instructions (Addendum)
Return for a a follow up appointment November 30  with Dr. Swaziland  Go to the lab in 2 weeks to check kidney function  Check your blood pressure at home 3-4 days each week and keep record of the readings.  Take your meds as follows:  Stop atorvastatin.  If the arthritis pain eases up then please let us know and we can try a different type of statin.   Cut amlodipine from 10 mg to 5 mg once daily  Start Entresto 24/26 mg twice daily, with first dose Wednesday October 5 in the evening    Bring all of your meds, your BP cuff and your record of home blood pressures to your next appointment.  Exercise as you're able, try to walk approximately 30 minutes per day.  Keep salt intake to a minimum, especially watch canned and prepared boxed foods.  Eat more fresh fruits and vegetables and fewer canned items.  Avoid eating in fast food restaurants.    HOW TO TAKE YOUR BLOOD PRESSURE: Rest 5 minutes before taking your blood pressure.  Don't smoke or drink caffeinated beverages for at least 30 minutes before. Take your blood pressure before (not after) you eat. Sit comfortably with your back supported and both feet on the floor (don't cross your legs). Elevate your arm to heart level on a table or a desk. Use the proper sized cuff. It should fit smoothly and snugly around your bare upper arm. There should be enough room to slip a fingertip under the cuff. The bottom edge of the cuff should be 1 inch above the crease of the elbow. Ideally, take 3 measurements at one sitting and record the average.

## 2021-08-12 ENCOUNTER — Other Ambulatory Visit: Payer: Self-pay

## 2021-08-12 ENCOUNTER — Encounter: Payer: Self-pay | Admitting: Internal Medicine

## 2021-08-12 ENCOUNTER — Ambulatory Visit (INDEPENDENT_AMBULATORY_CARE_PROVIDER_SITE_OTHER): Payer: Medicare Other | Admitting: Internal Medicine

## 2021-08-12 ENCOUNTER — Encounter: Payer: Self-pay | Admitting: Pharmacist Clinician (PhC)/ Clinical Pharmacy Specialist

## 2021-08-12 ENCOUNTER — Ambulatory Visit (INDEPENDENT_AMBULATORY_CARE_PROVIDER_SITE_OTHER): Payer: Medicare Other

## 2021-08-12 VITALS — BP 118/72 | HR 81 | Temp 98.5°F | Ht 70.5 in | Wt 211.6 lb

## 2021-08-12 DIAGNOSIS — R202 Paresthesia of skin: Secondary | ICD-10-CM | POA: Diagnosis not present

## 2021-08-12 DIAGNOSIS — E559 Vitamin D deficiency, unspecified: Secondary | ICD-10-CM | POA: Diagnosis not present

## 2021-08-12 DIAGNOSIS — M25511 Pain in right shoulder: Secondary | ICD-10-CM

## 2021-08-12 DIAGNOSIS — M25512 Pain in left shoulder: Secondary | ICD-10-CM | POA: Diagnosis not present

## 2021-08-12 DIAGNOSIS — E785 Hyperlipidemia, unspecified: Secondary | ICD-10-CM | POA: Insufficient documentation

## 2021-08-12 DIAGNOSIS — I480 Paroxysmal atrial fibrillation: Secondary | ICD-10-CM

## 2021-08-12 DIAGNOSIS — M353 Polymyalgia rheumatica: Secondary | ICD-10-CM

## 2021-08-12 DIAGNOSIS — I4891 Unspecified atrial fibrillation: Secondary | ICD-10-CM | POA: Insufficient documentation

## 2021-08-12 DIAGNOSIS — R5383 Other fatigue: Secondary | ICD-10-CM

## 2021-08-12 DIAGNOSIS — M2560 Stiffness of unspecified joint, not elsewhere classified: Secondary | ICD-10-CM

## 2021-08-12 DIAGNOSIS — I25118 Atherosclerotic heart disease of native coronary artery with other forms of angina pectoris: Secondary | ICD-10-CM | POA: Diagnosis not present

## 2021-08-12 DIAGNOSIS — I509 Heart failure, unspecified: Secondary | ICD-10-CM

## 2021-08-12 LAB — TSH: TSH: 4.73 u[IU]/mL (ref 0.35–5.50)

## 2021-08-12 LAB — VITAMIN B12: Vitamin B-12: 636 pg/mL (ref 211–911)

## 2021-08-12 LAB — CORTISOL: Cortisol, Plasma: 20 ug/dL

## 2021-08-12 LAB — T4, FREE: Free T4: 0.92 ng/dL (ref 0.60–1.60)

## 2021-08-12 LAB — SEDIMENTATION RATE: Sed Rate: 37 mm/hr — ABNORMAL HIGH (ref 0–20)

## 2021-08-12 LAB — VITAMIN D 25 HYDROXY (VIT D DEFICIENCY, FRACTURES): VITD: 33.62 ng/mL (ref 30.00–100.00)

## 2021-08-12 LAB — URIC ACID: Uric Acid, Serum: 5.6 mg/dL (ref 4.0–7.8)

## 2021-08-12 LAB — CK: Total CK: 26 U/L (ref 7–232)

## 2021-08-12 MED ORDER — PREDNISONE 5 MG PO TABS: 5.0000 mg | ORAL_TABLET | Freq: Every day | ORAL | 3 refills | Status: DC

## 2021-08-12 MED ORDER — PREDNISONE 5 MG PO TABS: 15.0000 mg | ORAL_TABLET | Freq: Every day | ORAL | 3 refills | Status: DC

## 2021-08-12 NOTE — Assessment & Plan Note (Addendum)
Severe.  PMR vs other -rule out cervical radiculopathy, cervical spinal stenosis.  Will obtain C-spine x-ray.  Steroids should help.  Consider C-spine MRI

## 2021-08-12 NOTE — Assessment & Plan Note (Addendum)
Off Statins >2 wks due to muscle weakness

## 2021-08-12 NOTE — Assessment & Plan Note (Addendum)
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  Information provided. His older half-brother has had some sort of vasculitis a few years ago.  He is 94 now. Lipitor was d/c'd due to muscle weakness Start empiric Prednisone 15 mg/d RTC 1 week Check RF, ESR, CK  Potential benefits of a long term steroid  use as well as potential risks  and complications were explained to the patient and were aknowledged.

## 2021-08-12 NOTE — Assessment & Plan Note (Addendum)
Both shoulders, lumbar spine with hips Severe.  PMR vs other -rule out cervical radiculopathy, cervical and lumbar spinal stenosis.  Will obtain C-spine x-ray.  Steroids should help.  Consider C-spine MRI

## 2021-08-12 NOTE — Assessment & Plan Note (Signed)
Patient has noticed new onset arthritis in the past few weeks.  Suspect this could be attributed to his atorvastatin, which was started in August.  Advised that he discontinue medication for 2 weeks and determine if this is truly the cause.  If so, he can let Dr. Swaziland know when he returns in November.  If this was the cause, would suggest trying rosuvastatin 5 mg 2-3 times per week at first to determine what he is able to tolerate.

## 2021-08-12 NOTE — Progress Notes (Signed)
Subjective:  Patient ID: Dustin Clayton, male    DOB: [birth year]  Age: 75 y.o. MRN: [id number]  CC: Establish Care (TOC from Dr. Rolan Lipa)   HPI Dustin Clayton presents for a new pt visit.  He is here with his wife. C/o pain and stiffness in B shoulders x 4 weeks>> legs. He started to use a cane 4 wks ago - unstable. On Lipitor since Sept 15 to Aug 06, 2021.  His older half-brother has had some sort of vasculitis a few years ago.  He is 94 now. To address CHF, CAD, dyslipidemia... No alcohol  In 2018 he underwent emergent cardiac cath that showed chronic occlusion of the distal LCX. There was 60-70% ostial first diagonal stenosis. No clear culprit. EF was normal by cath and Echo. It was felt that troponin elevation was due to demand ischemia and that his presentation was not primarily cardiac. Review of Ecg showed really no change compared to older Ecgs. He had elevated LFTs and underwent ERCP with findings of retained CBD stone. He had ERCP with sphincterotomy. LFTs trended down. Surgical consult recommended as outpatient. He also had gastritis and duodenal ulcers without active bleeding- treated with PPI. He has a history of Etoh abuse and abstinence recommended. Statins were held due to elevated LFTs. Thrombocytopenia also noted but improved.   Outpatient Medications Prior to Visit  Medication Sig Dispense Refill   acetaminophen (TYLENOL) 650 MG CR tablet Take 650 mg by mouth every 8 (eight) hours as needed for pain.     amLODipine (NORVASC) 5 MG tablet Take 5 mg by mouth daily.     apixaban (ELIQUIS) 5 MG TABS tablet Take 1 tablet (5 mg total) by mouth 2 (two) times daily. 180 tablet 3   Cholecalciferol (VITAMIN D3 PO) Take 1 capsule by mouth daily.     Coenzyme Q10 (CO Q 10 PO) Take 1 capsule by mouth daily.     cyclobenzaprine (FLEXERIL) 5 MG tablet Take 5 mg by mouth 3 (three) times daily as needed for muscle spasms.      metoprolol tartrate (LOPRESSOR) 50 MG tablet Take 1  tablet (50 mg total) by mouth 2 (two) times daily. 180 tablet 3   sacubitril-valsartan (ENTRESTO) 24-26 MG Take 1 tablet by mouth 2 (two) times daily. 60 tablet 6   amLODipine (NORVASC) 10 MG tablet Take 1 tablet (10 mg total) by mouth daily. 90 tablet 3   atorvastatin (LIPITOR) 20 MG tablet Take 1 tablet (20 mg total) by mouth daily. 90 tablet 3   Flaxseed, Linseed, (FLAX SEED OIL PO) Take 5 mLs by mouth daily. (Patient not taking: No sig reported)     No facility-administered medications prior to visit.    ROS: Review of Systems  Constitutional:  Positive for fatigue. Negative for appetite change and unexpected weight change.  HENT:  Negative for congestion, nosebleeds, sneezing, sore throat and trouble swallowing.   Eyes:  Negative for itching and visual disturbance.  Respiratory:  Negative for cough.   Cardiovascular:  Negative for chest pain, palpitations and leg swelling.  Gastrointestinal:  Negative for abdominal distention, blood in stool, diarrhea and nausea.  Genitourinary:  Negative for frequency and hematuria.  Musculoskeletal:  Positive for arthralgias, back pain, gait problem, myalgias, neck pain and neck stiffness. Negative for joint swelling.  Skin:  Negative for rash.  Neurological:  Negative for dizziness, tremors, speech difficulty and weakness.  Psychiatric/Behavioral:  Negative for agitation, dysphoric mood, sleep disturbance and suicidal ideas. The patient is  not nervous/anxious.    Objective:  BP 118/72 (BP Location: Left Arm)   Pulse 81   Temp 98.5 F (36.9 C) (Oral)   Ht 5' 10.5" (1.791 m)   Wt 211 lb 9.6 oz (96 kg)   SpO2 98%   BMI 29.93 kg/m   BP Readings from Last 3 Encounters:  08/12/21 118/72  08/06/21 102/60  06/19/21 (!) 149/73    Wt Readings from Last 3 Encounters:  08/12/21 211 lb 9.6 oz (96 kg)  08/06/21 213 lb (96.6 kg)  06/19/21 227 lb (103 kg)    Physical Exam Constitutional:      General: He is not in acute distress.     Appearance: He is well-developed. He is obese. He is not toxic-appearing.     Comments: NAD  Eyes:     Conjunctiva/sclera: Conjunctivae normal.     Pupils: Pupils are equal, round, and reactive to light.  Neck:     Thyroid: No thyromegaly.     Vascular: No JVD.  Cardiovascular:     Rate and Rhythm: Normal rate and regular rhythm.     Heart sounds: Normal heart sounds. No murmur heard.   No friction rub. No gallop.  Pulmonary:     Effort: Pulmonary effort is normal. No respiratory distress.     Breath sounds: Normal breath sounds. No wheezing or rales.  Chest:     Chest wall: No tenderness.  Abdominal:     General: Bowel sounds are normal. There is no distension.     Palpations: Abdomen is soft. There is no mass.     Tenderness: There is no abdominal tenderness. There is no guarding or rebound.  Musculoskeletal:        General: Tenderness present. Normal range of motion.     Cervical back: Normal range of motion.  Lymphadenopathy:     Cervical: No cervical adenopathy.  Skin:    General: Skin is warm and dry.     Findings: No rash.  Neurological:     Mental Status: He is alert and oriented to person, place, and time.     Cranial Nerves: No cranial nerve deficit.     Motor: Weakness present. No abnormal muscle tone.     Coordination: Coordination normal.     Gait: Gait abnormal.     Deep Tendon Reflexes: Reflexes are normal and symmetric.  Psychiatric:        Behavior: Behavior normal.        Thought Content: Thought content normal.        Judgment: Judgment normal.  The patient is using a staff.  Lumbar spine is painful on range of motion.  He has difficulty walking, standing up.  Both shoulders are very stiff and tender to palpation.  He can hardly lift his shoulders up.  The range of motion is markedly decreased.  Left hand with a palmar contracture, range of motion is painful and limited.  Deep tendon reflexes are somewhat decreased  Lab Results  Component Value Date    WBC 5.2 07/25/2017   HGB 14.2 07/25/2017   HCT 42.9 07/25/2017   PLT 85 (L) 07/25/2017   GLUCOSE 96 07/25/2017   CHOL 202 (H) 07/20/2017   TRIG 80 07/20/2017   HDL 72 07/20/2017   LDLCALC 114 (H) 07/20/2017   ALT 238 (H) 07/25/2017   AST 72 (H) 07/25/2017   NA 137 07/25/2017   K 3.9 07/25/2017   CL 106 07/25/2017   CREATININE 0.83 07/25/2017  BUN 13 07/25/2017   CO2 24 07/25/2017   TSH 4.73 08/12/2021   INR 1.00 07/20/2017    US Abdomen Complete  Result Date: 07/21/2017 CLINICAL DATA:  Elevated liver function tests. EXAM: ABDOMEN ULTRASOUND COMPLETE COMPARISON:  None. FINDINGS: Gallbladder: Multiple gallstones are noted with the largest measuring 1.3 cm. No significant gallbladder wall thickening or pericholecystic fluid is noted. Sludge is noted as well. No sonographic Murphy's sign is noted. Common bile duct: Diameter: 5.9 mm which is within normal limits. Liver: 1.1 cm simple cyst is noted in left hepatic lobe. Mildly increased echogenicity of hepatic parenchyma is noted suggesting fatty infiltration or hepatocellular disease. Portal vein is patent on color Doppler imaging with normal direction of blood flow towards the liver. IVC: No abnormality visualized. Pancreas: Visualized portion unremarkable. Spleen: Size and appearance within normal limits. Right Kidney: Length: 12.3 cm. Echogenicity within normal limits. No mass or hydronephrosis visualized. Left Kidney: Length: 13.1 cm. Echogenicity within normal limits. No mass or hydronephrosis visualized. Abdominal aorta: No aneurysm visualized. Other findings: None. IMPRESSION: Cholelithiasis without definite evidence of cholecystitis. Mildly increased echogenicity of hepatic parenchyma is noted suggesting fatty infiltration or diffuse hepatocellular disease. Small left hepatic cyst is noted. Electronically Signed   By: Marijo Conception, M.D.   On: 07/21/2017 09:54   CT ABDOMEN PELVIS W CONTRAST  Result Date: 07/21/2017 CLINICAL DATA:   Diffuse abdominal pain and distention for several days. Elevated liver function tests. Fever. EXAM: CT ABDOMEN AND PELVIS WITH CONTRAST TECHNIQUE: Multidetector CT imaging of the abdomen and pelvis was performed using the standard protocol following bolus administration of intravenous contrast. CONTRAST:  190m ISOVUE-300 IOPAMIDOL (ISOVUE-300) INJECTION 61% COMPARISON:  07/21/2017 abdominal sonogram. FINDINGS: Lower chest: Mild patchy tree-in-bud opacities in the dependent right lung base. Subsegmental atelectasis in the dependent lung bases. Hepatobiliary: Normal liver size. A few simple lateral segment left liver lobe cysts, largest 2.0 cm. Several subcentimeter hypodense lesions scattered throughout the liver, too small to characterize, for which no follow-up is required unless the patient has risk factors for liver malignancy. Nondistended gallbladder contains multiple calcified gallstones measuring up to 1.8 cm. No definite gallbladder wall thickening or pericholecystic fluid. No significant intrahepatic biliary ductal dilatation. Common bile duct diameter 6 mm, top-normal. There is a 4 mm calcified stone in the lower third of the common bile duct near the ampulla. There is mild haziness of the fat surrounding the common bile duct. Pancreas: Normal, with no mass or duct dilation. Spleen: Normal size. No mass. Adrenals/Urinary Tract: Normal adrenals. Normal kidneys with no hydronephrosis and no renal mass. Normal bladder. Stomach/Bowel: Small hiatal hernia. Otherwise nondistended and grossly normal stomach. Normal caliber small bowel with no small bowel wall thickening. Appendectomy. Moderate diffuse colonic diverticulosis, most prominent in the sigmoid colon, with no large bowel wall thickening or pericolonic fat stranding. 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 Patent portal, splenic, hepatic and renal veins. No pathologically enlarged lymph nodes  in the abdomen or pelvis. Reproductive: Normal size prostate with nonspecific internal prostatic calcifications. Other: No pneumoperitoneum, ascites or focal fluid collection. Musculoskeletal: No aggressive appearing focal osseous lesions. Moderate thoracolumbar spondylosis, most prominent at L5-S1. IMPRESSION: 1. Cholelithiasis.  No CT findings of acute cholecystitis. 2. Solitary 4 mm choledocholith in the lower third of the common bile duct near the ampulla. CBD diameter 6 mm, top-normal. No intrahepatic biliary ductal dilatation. 3. Haziness of the fat surrounding the common bile duct, cannot exclude ascending cholangitis. 4. Mild patchy  tree-in-bud opacities at the dependent right lung base, compatible with mild aspiration or infectious bronchiolitis. 5. 3.1 cm Abdominal Aortic Aneurysm (ICD10-I71.9). Recommend follow-up aortic ultrasound in 3 years. This recommendation follows ACR consensus guidelines: White Paper of the ACR Incidental Findings Committee II on Vascular Findings. J Am Coll Radiol 2013; 10:789-794. 6.  Aortic Atherosclerosis (ICD10-I70.0). 7. Small hiatal hernia. 8. Moderate diffuse colonic diverticulosis. Electronically Signed   By: Ilona Sorrel M.D.   On: 07/21/2017 09:48   CARDIAC CATHETERIZATION  Result Date: 07/20/2017  Mid Cx to Dist Cx lesion, 100 %stenosed.  Mid RCA lesion, 20 %stenosed.  Prox Cx lesion, 20 %stenosed.  Ost 1st Diag to 1st Diag lesion, 60 %stenosed.  Prox LAD lesion, 20 %stenosed.  The left ventricular systolic function is normal.  LV end diastolic pressure is normal.  The left ventricular ejection fraction is greater than 65% by visual estimate.  There is no mitral valve regurgitation.  1. There is right to left filling of a small lateral wall branch. This appears to be a total occlusion of the distal Circumflex where the vessel becomes very small in caliber but cannot totally exclude this being a small Diagonal branch. The distal AV groove Circumflex changes  quickly to a small caliber vessel with a lateral branch. This could the site of total occlusion. The vascular territory supplied by this branch is very small. 2. Moderate stenosis in the ostium of the large first Diagonal branch with excellent flow down the vessel. This vessel represents a dual LAD. This lesion does not appear to be ulcerated and doe not appear to be flow limiting. 3. Mild stenosis in the proximal LAD. The mid LAD is tortuous and there is subtle dye staining in this bend with quick dye washout. The LAD continues to the apex. 4. The RCA is a large dominant vessel with mild plaque. The distal right gives off a collateral to a very small lateral wall vessel. 5. Normal LV systolic function 6. 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 As above, LBBB induced with the placement of the pigtail catheter in the LV. Echo in am. It is unclear if his presentation is due to the occlusion of the small lateral wall branch. He is at the completion of the cath not having any symptoms.   DG CHEST PORT 1 VIEW  Result Date: 07/21/2017 CLINICAL DATA:  Fevers EXAM: PORTABLE CHEST 1 VIEW COMPARISON:  01/06/2013 FINDINGS: The heart size and mediastinal contours are within normal limits. Both lungs are clear. The visualized skeletal structures are unremarkable. IMPRESSION: No active disease. Electronically Signed   By: Inez Catalina M.D.   On: 07/21/2017 07:47   ECHOCARDIOGRAM COMPLETE  Result Date: 07/21/2017                            *[facility]*                         [place] [address]                        [place], [place] [address]  [phone] ------------------------------------------------------------------- Transthoracic Echocardiography Patient:    Othmar, Ringer MR #:       [id number] Study Date: 07/21/2017 Gender:     M Age:         29 Height:     180.3 cm Weight:     101.7 kg BSA:        2.28 m^2 Pt. Status: Room:       2H22C  ATTENDING    Darlina Guys, MD  ADMITTING    Sonny Dandy, [place], [place], Inpatient  SONOGRAPHER  Cardell Peach, RDCS cc: ------------------------------------------------------------------- LV EF: 55% -   60% ------------------------------------------------------------------- Indications:      CAD of native vessels 414.01. ------------------------------------------------------------------- History:   PMH:   Myocardial infarction.  Risk factors: Hypertension. ------------------------------------------------------------------- Study Conclusions - Left ventricle: The cavity size was normal. There was moderate   concentric hypertrophy. Systolic function was normal. The   estimated ejection fraction was in the range of 55% to 60%. Wall   motion was normal; there were no regional wall motion   abnormalities. Doppler parameters are consistent with abnormal   left ventricular relaxation (grade 1 diastolic dysfunction). - Left atrium: The atrium was mildly dilated. - Right ventricle: The cavity size was mildly dilated. - Atrial septum: No defect or patent foramen ovale was identified. ------------------------------------------------------------------- Study data:  No prior study was available for comparison.  Study status:  Routine.  Procedure:  Transthoracic echocardiography. Image quality was adequate.  Study completion:  There were no complications.          Transthoracic echocardiography.  M-mode, complete 2D, spectral Doppler, and color Doppler.  Birthdate: Patient birthdate: [birth year].  Age:  Patient is 75 yr old.  Sex: Gender: male.    BMI: 31.3 kg/m^2.  Blood pressure:     127/69 Patient status:  Inpatient.  Study date:  Study date: 07/21/2017. Study time: 10:52 AM.  Location:  Bedside.  ------------------------------------------------------------------- ------------------------------------------------------------------- Left ventricle:  The cavity size was normal. There was moderate concentric hypertrophy. Systolic function was normal. The estimated ejection fraction was in the range of 55% to 60%. Wall motion was normal; there were no regional wall motion abnormalities. Doppler parameters are consistent with abnormal left ventricular relaxation (grade 1 diastolic dysfunction). There was no evidence of elevated ventricular filling pressure by Doppler parameters. ------------------------------------------------------------------- Aortic valve:   Structurally normal valve. Trileaflet. Cusp separation was normal.  Doppler:  Transvalvular velocity was within the normal range. There was no stenosis. There was no regurgitation. ------------------------------------------------------------------- Aorta:  Aortic root: The aortic root was normal in size. Ascending aorta: The ascending aorta was normal in size. ------------------------------------------------------------------- Mitral valve:   Structurally normal valve.   Leaflet separation was normal.  Doppler:  Transvalvular velocity was within the normal range. There was no evidence for stenosis. There was no regurgitation. ------------------------------------------------------------------- Left atrium:  The atrium was mildly dilated. ------------------------------------------------------------------- Atrial septum:  No defect or patent foramen ovale was identified.  ------------------------------------------------------------------- Right ventricle:  The cavity size was mildly dilated. Systolic function was normal. ------------------------------------------------------------------- Pulmonic valve:    Structurally normal valve.   Cusp separation was normal.  Doppler:  Transvalvular velocity was within the normal range. There was trivial regurgitation.  ------------------------------------------------------------------- Tricuspid valve:   Structurally normal valve.   Leaflet separation was normal.  Doppler:  Transvalvular velocity was within the normal range. There was trivial regurgitation. ------------------------------------------------------------------- Pulmonary artery:   Systolic pressure  was within the normal range.  ------------------------------------------------------------------- Right atrium:  The atrium was normal in size. ------------------------------------------------------------------- Pericardium:  There was no pericardial effusion. ------------------------------------------------------------------- Systemic veins: Inferior vena cava: The vessel was normal in size. The respirophasic diameter changes were in the normal range (= 50%), consistent with normal central venous pressure. ------------------------------------------------------------------- Measurements  Left ventricle                          Value        Reference  LV ID, ED, PLAX chordal                 52    mm     43 - 52  LV ID, ES, PLAX chordal                 30    mm     23 - 38  LV fx shortening, PLAX chordal          42    %      >=29  LV PW thickness, ED                     15    mm     ----------  IVS/LV PW ratio, ED                     1            <=1.3  Stroke volume, 2D                       74    ml     ----------  Stroke volume/bsa, 2D                   32    ml/m^2 ----------  LV e&', lateral                          6.53  cm/s   ----------  LV e&', medial                           4.9   cm/s   ----------  LV e&', average                          5.72  cm/s   ----------   Ventricular septum                      Value        Reference  IVS thickness, ED                       15    mm     ----------   LVOT                                    Value        Reference  LVOT ID, S                              20    mm     ----------  LVOT area  3.14   cm^2   ----------  LVOT peak velocity, S                   121   cm/s   ----------  LVOT mean velocity, S                   80.7  cm/s   ----------  LVOT VTI, S                             23.6  cm     ----------  LVOT peak gradient, S                   6     mm Hg  ----------   Aorta                                   Value        Reference  Aortic root ID, ED                      34    mm     ----------   Left atrium                             Value        Reference  LA ID, A-P, ES                          40    mm     ----------  LA ID/bsa, A-P                          1.75  cm/m^2 <=2.2  LA volume, S                            60    ml     ----------  LA volume/bsa, S                        26.3  ml/m^2 ----------  LA volume, ES, 1-p A4C                  65.2  ml     ----------  LA volume/bsa, ES, 1-p A4C              28.5  ml/m^2 ----------  LA volume, ES, 1-p A2C                  48.9  ml     ----------  LA volume/bsa, ES, 1-p A2C              21.4  ml/m^2 ----------   Mitral valve                            Value        Reference  Mitral deceleration time        (H)     289   ms     150 - 230  Mitral E/A ratio, peak  0.7          ----------   Pulmonary arteries                      Value        Reference  PA pressure, S, DP                      27    mm Hg  <=30   Tricuspid valve                         Value        Reference  Tricuspid regurg peak velocity          247   cm/s   ----------  Tricuspid peak RV-RA gradient           24    mm Hg  ----------   Right atrium                            Value        Reference  RA ID, S-I, ES, A4C             (H)     60.3  mm     34 - 49  RA area, ES, A4C                (H)     21.6  cm^2   8.3 - 19.5  RA volume, ES, A/L                      65.2  ml     ----------  RA volume/bsa, ES, A/L                  28.5  ml/m^2 ----------   Systemic veins                          Value        Reference  Estimated CVP                           3     mm Hg   ----------   Right ventricle                         Value        Reference  RV ID, minor axis, ED, A4C              42.95 mm     26 - 43  RV ID, minor axis, ED, A4C base         56    mm     ----------  TAPSE                                   23.1  mm     ----------  RV pressure, S, DP                      27    mm Hg  <=30  RV s&', lateral, S  15.9  cm/s   ---------- Legend: (L)  and  (H)  mark values outside specified reference range. ------------------------------------------------------------------- Prepared and Electronically Authenticated by Sanda Klein, MD 2018-09-18T13:09:48   Assessment & Plan:   Problem List Items Addressed This Visit     Atrial fibrillation ([place])    Rate controlled on metoprolol.  Continue on Eliquis      Relevant Medications   amLODipine (NORVASC) 5 MG tablet   Congestive heart failure (CHF) (HCC)    Compensated at present - currently on Entresto      Relevant Medications   amLODipine (NORVASC) 5 MG tablet   Hyperlipidemia    Off Statins >2 wks due to muscle weakness      Relevant Medications   amLODipine (NORVASC) 5 MG tablet   PMR (polymyalgia rheumatica) (HCC)    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  Information provided. Lipitor was d/c'd due to muscle weakness Start empiric Prednisone 15 mg/d RTC 1 week Check RF, ESR, CK  Potential benefits of a long term steroid  use as well as potential risks  and complications were explained to the patient and were aknowledged.        Relevant Orders   DG Cervical Spine Complete (Completed)   CK (Completed)   Cortisol (Completed)   Sedimentation rate (Completed)   TSH (Completed)   T4, free (Completed)   Uric acid (Completed)   Vitamin B12 (Completed)   VITAMIN D 25 Hydroxy (Vit-D Deficiency, Fractures) (Completed)   Rheumatoid factor (Completed)   Aldolase (Completed)   Shoulder pain, bilateral    Severe.  PMR vs other -rule out cervical radiculopathy, cervical spinal stenosis.   Will obtain C-spine x-ray.  Steroids should help.  Consider C-spine MRI      Relevant Orders   DG Cervical Spine Complete (Completed)   Sedimentation rate (Completed)   Stiffness in joint    Both shoulders, lumbar spine with hips Severe.  PMR vs other -rule out cervical radiculopathy, cervical and lumbar spinal stenosis.  Will obtain C-spine x-ray.  Steroids should help.  Consider C-spine MRI      Relevant Orders   DG Cervical Spine Complete (Completed)   Other Visit Diagnoses     Fatigue, unspecified type    -  Primary   Relevant Orders   CK (Completed)   TSH (Completed)   T4, free (Completed)   Uric acid (Completed)   Paresthesia       Relevant Orders   Sedimentation rate (Completed)   TSH (Completed)   Vitamin B12 (Completed)   Vitamin D deficiency       Relevant Orders   VITAMIN D 25 Hydroxy (Vit-D Deficiency, Fractures) (Completed)   Aldolase (Completed)         Follow-up: Return in about 1 week (around 08/19/2021) for a follow-up visit.  Walker Kehr, MD

## 2021-08-12 NOTE — Assessment & Plan Note (Signed)
Patient with new onset HF, EF at 45-50%.  Was given Sherryll Burger rx, however believed that it was not covered by insurance when saw price at pharmacy.  Will start today, however with BP only at 102/60, will have him cut the amlodipine from 10 to 5 mg daily.  He will start the [facility] tomorrow, after cutting the amlodipine, to avoid any hypotensive issues.  He was given a 30 day free sample card for the medication as well as the contact information to reach out to the manufacturer for assistance.   He will need to repeat labs in 2 weeks and keep his scheduled appointment with Dr. [place] in 4 weeks.

## 2021-08-12 NOTE — Patient Instructions (Signed)
   Polymyalgia Rheumatica Polymyalgia rheumatica (PMR) is an inflammatory disorder that causes the muscles and joints to ache and become stiff. Sometimes, PMR leads to a more dangerous condition that can cause vision loss (temporal arteritis or giant cell arteritis). What are the causes? The exact cause of PMR is not known. What increases the risk? You are more likely to develop this condition if you are: Male. 77 years of age or older. Caucasian. What are the signs or symptoms? Pain and stiffness are the main symptoms of PMR. Symptoms may: Be worse after inactivity and in the morning. Affect your: Hips, buttocks, and thighs. Neck, arms, and shoulders. This can make it hard to raise your arms above your head. Hands and wrists. Other symptoms include: Fever. Tiredness. Weakness. Depression. Decreased appetite. This may lead to weight loss. Symptoms may start slowly or suddenly. How is this diagnosed? This condition is diagnosed with your medical history and a physical exam. You may need to see a health care provider who specializes in diseases of the joints, muscles, and bones (rheumatologist). You may also have tests, including: Blood tests. X-rays. Ultrasound. How is this treated? PMR usually goes away without treatment, but it may take years. Your health care provider may recommend low-dose steroids and other medicines to help manage your symptoms of pain and stiffness. Regular exercise and rest will alsohelp your symptoms. Follow these instructions at home:  Take over-the-counter and prescription medicines only as told by your health care provider. Make sure to get enough rest and sleep. Eat a healthy and nutritious diet. Try to exercise most days of the week. Ask your health care provider what type of exercise is best for you. Keep all follow-up visits as told by your health care provider. This is important. Contact a health care provider if: Your symptoms do not improve  with medicine. You have side effects from steroids. These may include: Weight gain. Swelling. Insomnia. Mood changes. Bruising. High blood sugar readings, if you have diabetes. Higher than normal blood pressure readings, if you monitor your blood pressure. Get help right away if: You develop symptoms of temporal arteritis, such as: A change in vision. Severe headache. Scalp pain. Jaw pain. Summary Polymyalgia rheumatica is an inflammatory disorder that causes aching and stiffness in your muscles and joints. The exact cause of this condition is not known. This condition usually goes away without treatment. Your health care provider may give you low-dose steroids to help manage your pain and stiffness. Rest and regular exercise will help the symptoms. This information is not intended to replace advice given to you by your health care provider. Make sure you discuss any questions you have with your healthcare provider. Document Revised: 08/26/2018 Document Reviewed: 08/26/2018 [employer]vier Patient Education  2022 [employer]  

## 2021-08-14 LAB — ALDOLASE: Aldolase: 3 U/L (ref <=8.1)

## 2021-08-14 LAB — RHEUMATOID FACTOR: Rheumatoid fact SerPl-aCnc: <14 IU/mL (ref <14)

## 2021-08-18 NOTE — Assessment & Plan Note (Addendum)
Compensated at present - currently on [facility]

## 2021-08-18 NOTE — Assessment & Plan Note (Signed)
Rate controlled on metoprolol.  Continue on Eliquis

## 2021-08-20 ENCOUNTER — Encounter: Payer: BC Managed Care – PPO | Admitting: Internal Medicine

## 2021-08-21 ENCOUNTER — Ambulatory Visit: Payer: Medicare Other | Admitting: Internal Medicine

## 2021-08-23 ENCOUNTER — Other Ambulatory Visit: Payer: Self-pay

## 2021-08-23 ENCOUNTER — Other Ambulatory Visit: Payer: Medicare Other | Admitting: *Deleted

## 2021-08-23 DIAGNOSIS — E875 Hyperkalemia: Secondary | ICD-10-CM

## 2021-08-23 LAB — BASIC METABOLIC PANEL
BUN/Creatinine Ratio: 15 (ref 10–24)
BUN: 14 mg/dL (ref 8–27)
CO2: 24 mmol/L (ref 20–29)
Calcium: 9.5 mg/dL (ref 8.6–10.2)
Chloride: 103 mmol/L (ref 96–106)
Creatinine, Ser: 0.93 mg/dL (ref 0.76–1.27)
Glucose: 88 mg/dL (ref 70–99)
Potassium: 5.4 mmol/L — ABNORMAL HIGH (ref 3.5–5.2)
Sodium: 140 mmol/L (ref 134–144)
eGFR: 85 mL/min/{1.73_m2} (ref >59)

## 2021-08-23 NOTE — Progress Notes (Signed)
met 

## 2021-08-24 LAB — BASIC METABOLIC PANEL
BUN/Creatinine Ratio: 14 (ref 10–24)
BUN: 14 mg/dL (ref 8–27)
CO2: 24 mmol/L (ref 20–29)
Calcium: 9.3 mg/dL (ref 8.6–10.2)
Chloride: 102 mmol/L (ref 96–106)
Creatinine, Ser: 1.02 mg/dL (ref 0.76–1.27)
Glucose: 102 mg/dL — ABNORMAL HIGH (ref 70–99)
Potassium: 5.3 mmol/L — ABNORMAL HIGH (ref 3.5–5.2)
Sodium: 139 mmol/L (ref 134–144)
eGFR: 76 mL/min/{1.73_m2} (ref >59)

## 2021-08-26 ENCOUNTER — Other Ambulatory Visit: Payer: Self-pay

## 2021-08-26 DIAGNOSIS — E875 Hyperkalemia: Secondary | ICD-10-CM

## 2021-08-26 NOTE — Progress Notes (Signed)
met

## 2021-08-26 NOTE — Telephone Encounter (Signed)
Ok we'll just plan on repeating as we discussed before.  Theron Arista

## 2021-08-28 ENCOUNTER — Ambulatory Visit (INDEPENDENT_AMBULATORY_CARE_PROVIDER_SITE_OTHER): Payer: Medicare Other | Admitting: Internal Medicine

## 2021-08-28 ENCOUNTER — Encounter: Payer: Self-pay | Admitting: Internal Medicine

## 2021-08-28 ENCOUNTER — Other Ambulatory Visit: Payer: Self-pay

## 2021-08-28 DIAGNOSIS — E785 Hyperlipidemia, unspecified: Secondary | ICD-10-CM

## 2021-08-28 DIAGNOSIS — M353 Polymyalgia rheumatica: Secondary | ICD-10-CM | POA: Diagnosis not present

## 2021-08-28 DIAGNOSIS — I480 Paroxysmal atrial fibrillation: Secondary | ICD-10-CM | POA: Diagnosis not present

## 2021-08-28 DIAGNOSIS — I509 Heart failure, unspecified: Secondary | ICD-10-CM

## 2021-08-28 DIAGNOSIS — I25118 Atherosclerotic heart disease of native coronary artery with other forms of angina pectoris: Secondary | ICD-10-CM | POA: Diagnosis not present

## 2021-08-28 MED ORDER — ENTRESTO 24-26 MG PO TABS: 1.0000 | ORAL_TABLET | Freq: Two times a day (BID) | ORAL | 3 refills | Status: DC

## 2021-08-28 MED ORDER — PREDNISONE 5 MG PO TABS: ORAL_TABLET | ORAL | 3 refills | Status: DC

## 2021-08-28 MED ORDER — PREDNISONE 1 MG PO TABS: ORAL_TABLET | ORAL | 3 refills | Status: DC

## 2021-08-28 NOTE — Progress Notes (Signed)
Subjective:  Patient ID: Dustin Clayton, male    DOB: [birth year]  Age: 75 y.o. MRN: [id number]  CC: Follow-up (1 week f/u)   HPI Dustin Clayton presents for a probable PMR -he is 50% better in regard to his shoulder and back pain. Less stiff in am. Started to walk outside with dogs.  He is more mobile.  Follow-up on hypertension and congestive heart failure.  Follow-up on atrial fibrillation  Outpatient Medications Prior to Visit  Medication Sig Dispense Refill   acetaminophen (TYLENOL) 650 MG CR tablet Take 650 mg by mouth every 8 (eight) hours as needed for pain.     amLODipine (NORVASC) 5 MG tablet Take 5 mg by mouth daily.     apixaban (ELIQUIS) 5 MG TABS tablet Take 1 tablet (5 mg total) by mouth 2 (two) times daily. 180 tablet 3   Cholecalciferol (VITAMIN D3 PO) Take 1 capsule by mouth daily.     Coenzyme Q10 (CO Q 10 PO) Take 1 capsule by mouth daily.     cyclobenzaprine (FLEXERIL) 5 MG tablet Take 5 mg by mouth 3 (three) times daily as needed for muscle spasms.      metoprolol tartrate (LOPRESSOR) 50 MG tablet Take 1 tablet (50 mg total) by mouth 2 (two) times daily. 180 tablet 3   predniSONE (DELTASONE) 5 MG tablet Take 3 tablets (15 mg total) by mouth daily with breakfast. 90 tablet 3   sacubitril-valsartan (ENTRESTO) 24-26 MG Take 1 tablet by mouth 2 (two) times daily. (Patient not taking: Reported on 08/28/2021) 60 tablet 6   No facility-administered medications prior to visit.    ROS: Review of Systems  Constitutional:  Positive for fatigue. Negative for appetite change and unexpected weight change.  HENT:  Negative for congestion, nosebleeds, sneezing, sore throat and trouble swallowing.   Eyes:  Negative for itching and visual disturbance.  Respiratory:  Negative for cough.   Cardiovascular:  Negative for chest pain, palpitations and leg swelling.  Gastrointestinal:  Negative for abdominal distention, blood in stool, diarrhea and nausea.  Genitourinary:  Negative  for frequency and hematuria.  Musculoskeletal:  Positive for arthralgias, back pain and gait problem. Negative for joint swelling and neck pain.  Skin:  Negative for rash.  Neurological:  Positive for weakness. Negative for dizziness, tremors and speech difficulty.  Psychiatric/Behavioral:  Negative for agitation, dysphoric mood, sleep disturbance and suicidal ideas. The patient is not nervous/anxious.    Objective:  BP 122/72 (BP Location: Left Arm)   Pulse 76   Temp 98.5 F (36.9 C) (Oral)   Wt 212 lb 3.2 oz (96.3 kg)   SpO2 97%   BMI 30.02 kg/m   BP Readings from Last 3 Encounters:  08/28/21 122/72  08/12/21 118/72  08/06/21 102/60    Wt Readings from Last 3 Encounters:  08/28/21 212 lb 3.2 oz (96.3 kg)  08/12/21 211 lb 9.6 oz (96 kg)  08/06/21 213 lb (96.6 kg)    Physical Exam Constitutional:      General: He is not in acute distress.    Appearance: He is well-developed.     Comments: NAD  Eyes:     Conjunctiva/sclera: Conjunctivae normal.     Pupils: Pupils are equal, round, and reactive to light.  Neck:     Thyroid: No thyromegaly.     Vascular: No JVD.  Cardiovascular:     Rate and Rhythm: Normal rate and regular rhythm.     Heart sounds: Normal heart sounds. No murmur heard.  No friction rub. No gallop.  Pulmonary:     Effort: Pulmonary effort is normal. No respiratory distress.     Breath sounds: Normal breath sounds. No wheezing or rales.  Chest:     Chest wall: No tenderness.  Abdominal:     General: Bowel sounds are normal. There is no distension.     Palpations: Abdomen is soft. There is no mass.     Tenderness: There is no abdominal tenderness. There is no guarding or rebound.  Musculoskeletal:        General: Tenderness present. Normal range of motion.     Cervical back: Normal range of motion.  Lymphadenopathy:     Cervical: No cervical adenopathy.  Skin:    General: Skin is warm and dry.     Findings: No rash.  Neurological:     Mental  Status: He is alert and oriented to person, place, and time.     Cranial Nerves: No cranial nerve deficit.     Motor: No abnormal muscle tone.     Coordination: Coordination normal.     Gait: Gait normal.     Deep Tendon Reflexes: Reflexes are normal and symmetric.  Psychiatric:        Behavior: Behavior normal.        Thought Content: Thought content normal.        Judgment: Judgment normal.  Shoulders are less stiff with better range of motion Gait is better.  Lumbar spine is more mobile.  Lab Results  Component Value Date   WBC 5.2 07/25/2017   HGB 14.2 07/25/2017   HCT 42.9 07/25/2017   PLT 85 (L) 07/25/2017   GLUCOSE 97 08/30/2021   CHOL 202 (H) 07/20/2017   TRIG 80 07/20/2017   HDL 72 07/20/2017   LDLCALC 114 (H) 07/20/2017   ALT 29 08/30/2021   AST 14 08/30/2021   NA 138 08/30/2021   K 4.0 08/30/2021   CL 102 08/30/2021   CREATININE 0.86 08/30/2021   BUN 14 08/30/2021   CO2 27 08/30/2021   TSH 4.73 08/12/2021   INR 1.00 07/20/2017    US Abdomen Complete  Result Date: 07/21/2017 CLINICAL DATA:  Elevated liver function tests. EXAM: ABDOMEN ULTRASOUND COMPLETE COMPARISON:  None. FINDINGS: Gallbladder: Multiple gallstones are noted with the largest measuring 1.3 cm. No significant gallbladder wall thickening or pericholecystic fluid is noted. Sludge is noted as well. No sonographic Murphy's sign is noted. Common bile duct: Diameter: 5.9 mm which is within normal limits. Liver: 1.1 cm simple cyst is noted in left hepatic lobe. Mildly increased echogenicity of hepatic parenchyma is noted suggesting fatty infiltration or hepatocellular disease. Portal vein is patent on color Doppler imaging with normal direction of blood flow towards the liver. IVC: No abnormality visualized. Pancreas: Visualized portion unremarkable. Spleen: Size and appearance within normal limits. Right Kidney: Length: 12.3 cm. Echogenicity within normal limits. No mass or hydronephrosis visualized. Left  Kidney: Length: 13.1 cm. Echogenicity within normal limits. No mass or hydronephrosis visualized. Abdominal aorta: No aneurysm visualized. Other findings: None. IMPRESSION: Cholelithiasis without definite evidence of cholecystitis. Mildly increased echogenicity of hepatic parenchyma is noted suggesting fatty infiltration or diffuse hepatocellular disease. Small left hepatic cyst is noted. Electronically Signed   By: Lupita Raider, M.D.   On: 07/21/2017 09:54   CT ABDOMEN PELVIS W CONTRAST  Result Date: 07/21/2017 CLINICAL DATA:  Diffuse abdominal pain and distention for several days. Elevated liver function tests. Fever. EXAM: CT ABDOMEN AND PELVIS WITH CONTRAST  TECHNIQUE: Multidetector CT imaging of the abdomen and pelvis was performed using the standard protocol following bolus administration of intravenous contrast. CONTRAST:  ISOVUE-300 IOPAMIDOL (ISOVUE-300) INJECTION 61% COMPARISON:  07/21/2017 abdominal sonogram. FINDINGS: Lower chest: Mild patchy tree-in-bud opacities in the dependent right lung base. Subsegmental atelectasis in the dependent lung bases. Hepatobiliary: Normal liver size. A few simple lateral segment left liver lobe cysts, largest 2.0 cm. Several subcentimeter hypodense lesions scattered throughout the liver, too small to characterize, for which no follow-up is required unless the patient has risk factors for liver malignancy. Nondistended gallbladder contains multiple calcified gallstones measuring up to 1.8 cm. No definite gallbladder wall thickening or pericholecystic fluid. No significant intrahepatic biliary ductal dilatation. Common bile duct diameter 6 mm, top-normal. There is a 4 mm calcified stone in the lower third of the common bile duct near the ampulla. There is mild haziness of the fat surrounding the common bile duct. Pancreas: Normal, with no mass or duct dilation. Spleen: Normal size. No mass. Adrenals/Urinary Tract: Normal adrenals. Normal kidneys with no  hydronephrosis and no renal mass. Normal bladder. Stomach/Bowel: Small hiatal hernia. Otherwise nondistended and grossly normal stomach. Normal caliber small bowel with no small bowel wall thickening. Appendectomy. Moderate diffuse colonic diverticulosis, most prominent in the sigmoid colon, with no large bowel wall thickening or pericolonic fat stranding. 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 Patent portal, splenic, hepatic and renal veins. No pathologically enlarged lymph nodes in the abdomen or pelvis. Reproductive: Normal size prostate with nonspecific internal prostatic calcifications. Other: No pneumoperitoneum, ascites or focal fluid collection. Musculoskeletal: No aggressive appearing focal osseous lesions. Moderate thoracolumbar spondylosis, most prominent at L5-S1. IMPRESSION: 1. Cholelithiasis.  No CT findings of acute cholecystitis. 2. Solitary 4 mm choledocholith in the lower third of the common bile duct near the ampulla. CBD diameter 6 mm, top-normal. No intrahepatic biliary ductal dilatation. 3. Haziness of the fat surrounding the common bile duct, cannot exclude ascending cholangitis. 4. Mild patchy tree-in-bud opacities at the dependent right lung base, compatible with mild aspiration or infectious bronchiolitis. 5. 3.1 cm Abdominal Aortic Aneurysm (ICD10-I71.9). Recommend follow-up aortic ultrasound in 3 years. This recommendation follows ACR consensus guidelines: White Paper of the ACR Incidental Findings Committee II on Vascular Findings. J Am Coll Radiol 2013; 10:789-794. 6.  Aortic Atherosclerosis (ICD10-I70.0). 7. Small hiatal hernia. 8. Moderate diffuse colonic diverticulosis. Electronically Signed   By: Delbert Phenix M.D.   On: 07/21/2017 09:48   CARDIAC CATHETERIZATION  Result Date: 07/20/2017  Mid Cx to Dist Cx lesion, 100 %stenosed.  Mid RCA lesion, 20 %stenosed.  Prox Cx lesion, 20 %stenosed.  Ost 1st Diag to 1st  Diag lesion, 60 %stenosed.  Prox LAD lesion, 20 %stenosed.  The left ventricular systolic function is normal.  LV end diastolic pressure is normal.  The left ventricular ejection fraction is greater than 65% by visual estimate.  There is no mitral valve regurgitation.  1. There is right to left filling of a small lateral wall branch. This appears to be a total occlusion of the distal Circumflex where the vessel becomes very small in caliber but cannot totally exclude this being a small Diagonal branch. The distal AV groove Circumflex changes quickly to a small caliber vessel with a lateral branch. This could the site of total occlusion. The vascular territory supplied by this branch is very small. 2. Moderate stenosis in the ostium of the large first Diagonal branch with excellent flow down the vessel. This  vessel represents a dual LAD. This lesion does not appear to be ulcerated and doe not appear to be flow limiting. 3. Mild stenosis in the proximal LAD. The mid LAD is tortuous and there is subtle dye staining in this bend with quick dye washout. The LAD continues to the apex. 4. The RCA is a large dominant vessel with mild plaque. The distal right gives off a collateral to a very small lateral wall vessel. 5. Normal LV systolic function 6. 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 As above, LBBB induced with the placement of the pigtail catheter in the LV. Echo in am. It is unclear if his presentation is due to the occlusion of the small lateral wall branch. He is at the completion of the cath not having any symptoms.   DG CHEST PORT 1 VIEW  Result Date: 07/21/2017 CLINICAL DATA:  Fevers EXAM: PORTABLE CHEST 1 VIEW COMPARISON:  01/06/2013 FINDINGS: The heart size and mediastinal contours are within normal limits. Both lungs are clear. The visualized skeletal structures are unremarkable. IMPRESSION:  No active disease. Electronically Signed   By: Alcide Clever M.D.   On: 07/21/2017 07:47   ECHOCARDIOGRAM COMPLETE  Result Date: 07/21/2017                            [facility]*                   *[facility]*                         [address]                        [place], [place] [address]                            [phone] ------------------------------------------------------------------- Transthoracic Echocardiography Patient:    Naphtali, Zywicki MR #:       [id number] Study Date: 07/21/2017 Gender:     M Age:        46 Height:     180.3 cm Weight:     101.7 kg BSA:        2.28 m^2 Pt. 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 ------------------------------------------------------------------- History:   PMH:   Myocardial infarction.  Risk factors: Hypertension. ------------------------------------------------------------------- Study Conclusions - Left ventricle: The cavity size was normal. There was moderate   concentric hypertrophy. Systolic function was normal. The   estimated ejection fraction was in the range of 55% to 60%. Wall   motion was normal; there were no regional wall motion   abnormalities. Doppler parameters are consistent with abnormal   left ventricular relaxation (grade 1 diastolic dysfunction). - Left atrium: The atrium was mildly dilated. - Right ventricle: The cavity size was mildly dilated. - Atrial septum: No defect or patent foramen ovale was identified. ------------------------------------------------------------------- Study data:  No prior  study was available for  comparison.  Study status:  Routine.  Procedure:  Transthoracic echocardiography. Image quality was adequate.  Study completion:  There were no complications.          Transthoracic echocardiography.  M-mode, complete 2D, spectral Doppler, and color Doppler.  Birthdate: Patient birthdate: 04-07-1946.  Age:  Patient is 75 yr old.  Sex: Gender: male.    BMI: 31.3 kg/m^2.  Blood pressure:     127/69 Patient status:  Inpatient.  Study date:  Study date: 07/21/2017. Study time: 10:52 AM.  Location:  Bedside. ------------------------------------------------------------------- ------------------------------------------------------------------- Left ventricle:  The cavity size was normal. There was moderate concentric hypertrophy. Systolic function was normal. The estimated ejection fraction was in the range of 55% to 60%. Wall motion was normal; there were no regional wall motion abnormalities. Doppler parameters are consistent with abnormal left ventricular relaxation (grade 1 diastolic dysfunction). There was no evidence of elevated ventricular filling pressure by Doppler parameters. ------------------------------------------------------------------- Aortic valve:   Structurally normal valve. Trileaflet. Cusp separation was normal.  Doppler:  Transvalvular velocity was within the normal range. There was no stenosis. There was no regurgitation. ------------------------------------------------------------------- Aorta:  Aortic root: The aortic root was normal in size. Ascending aorta: The ascending aorta was normal in size. ------------------------------------------------------------------- Mitral valve:   Structurally normal valve.   Leaflet separation was normal.  Doppler:  Transvalvular velocity was within the normal range. There was no evidence for stenosis. There was no regurgitation. ------------------------------------------------------------------- Left atrium:  The atrium was mildly dilated.  ------------------------------------------------------------------- Atrial septum:  No defect or patent foramen ovale was identified.  ------------------------------------------------------------------- Right ventricle:  The cavity size was mildly dilated. Systolic function was normal. ------------------------------------------------------------------- Pulmonic valve:    Structurally normal valve.   Cusp separation was normal.  Doppler:  Transvalvular velocity was within the normal range. There was trivial regurgitation. ------------------------------------------------------------------- Tricuspid valve:   Structurally normal valve.   Leaflet separation was normal.  Doppler:  Transvalvular velocity was within the normal range. There was trivial regurgitation. ------------------------------------------------------------------- Pulmonary artery:   Systolic pressure was within the normal range.  ------------------------------------------------------------------- Right atrium:  The atrium was normal in size. ------------------------------------------------------------------- Pericardium:  There was no pericardial effusion. ------------------------------------------------------------------- Systemic veins: Inferior vena cava: The vessel was normal in size. The respirophasic diameter changes were in the normal range (= 50%), consistent with normal central venous pressure. ------------------------------------------------------------------- Measurements  Left ventricle                          Value        Reference  LV ID, ED, PLAX chordal                 52    mm     43 - 52  LV ID, ES, PLAX chordal                 30    mm     23 - 38  LV fx shortening, PLAX chordal          42    %      >=29  LV PW thickness, ED                     15    mm     ----------  IVS/LV PW ratio, ED                     1            <=  1.3  Stroke volume, 2D                       74    ml     ----------  Stroke volume/bsa, 2D                    32    ml/m^2 ----------  LV e&', lateral                          6.53  cm/s   ----------  LV e&', medial                           4.9   cm/s   ----------  LV e&', average                          5.72  cm/s   ----------   Ventricular septum                      Value        Reference  IVS thickness, ED                       15    mm     ----------   LVOT                                    Value        Reference  LVOT ID, S                              20    mm     ----------  LVOT area                               3.14  cm^2   ----------  LVOT peak velocity, S                   121   cm/s   ----------  LVOT mean velocity, S                   80.7  cm/s   ----------  LVOT VTI, S                             23.6  cm     ----------  LVOT peak gradient, S                   6     mm Hg  ----------   Aorta                                   Value        Reference  Aortic root ID, ED                      34    mm     ----------   Left atrium  Value        Reference  LA ID, A-P, ES                          40    mm     ----------  LA ID/bsa, A-P                          1.75  cm/m^2 <=2.2  LA volume, S                            60    ml     ----------  LA volume/bsa, S                        26.3  ml/m^2 ----------  LA volume, ES, 1-p A4C                  65.2  ml     ----------  LA volume/bsa, ES, 1-p A4C              28.5  ml/m^2 ----------  LA volume, ES, 1-p A2C                  48.9  ml     ----------  LA volume/bsa, ES, 1-p A2C              21.4  ml/m^2 ----------   Mitral valve                            Value        Reference  Mitral deceleration time        (H)     289   ms     150 - 230  Mitral E/A ratio, peak                  0.7          ----------   Pulmonary arteries                      Value        Reference  PA pressure, S, DP                      27    mm Hg  <=30   Tricuspid valve                         Value        Reference  Tricuspid regurg peak velocity          247    cm/s   ----------  Tricuspid peak RV-RA gradient           24    mm Hg  ----------   Right atrium                            Value        Reference  RA ID, S-I, ES, A4C             (H)     60.3  mm     34 - 49  RA area, ES, A4C                (  H)     21.6  cm^2   8.3 - 19.5  RA volume, ES, A/L                      65.2  ml     ----------  RA volume/bsa, ES, A/L                  28.5  ml/m^2 ----------   Systemic veins                          Value        Reference  Estimated CVP                           3     mm Hg  ----------   Right ventricle                         Value        Reference  RV ID, minor axis, ED, A4C              42.95 mm     26 - 43  RV ID, minor axis, ED, A4C base         56    mm     ----------  TAPSE                                   23.1  mm     ----------  RV pressure, S, DP                      27    mm Hg  <=30  RV s&', lateral, S                       15.9  cm/s   ---------- Legend: (L)  and  (H)  mark values outside specified reference range. ------------------------------------------------------------------- Prepared and Electronically Authenticated by Thurmon Fair, MD 2018-09-18T13:09:48   Assessment & Plan:   Problem List Items Addressed This Visit     Atrial fibrillation (HCC)    Continue on Eliquis      Relevant Medications   sacubitril-valsartan (ENTRESTO) 24-26 MG   Congestive heart failure (CHF) (HCC)    He is not taking Entresto.  He ran out.  He was advised to re-start Entresto      Relevant Medications   sacubitril-valsartan (ENTRESTO) 24-26 MG   Hyperlipidemia    Lipitor on hold      Relevant Medications   sacubitril-valsartan (ENTRESTO) 24-26 MG   PMR (polymyalgia rheumatica) (HCC)    50% better Less stiff in am. Started to walk  Continue with prednisone.  Taper schedule was discussed.  We will reduce the dose by 1mg /day every month.  Follow-up in 1 month      Relevant Orders   Comprehensive metabolic panel (Completed)   Sedimentation rate  (Completed)      Meds ordered this encounter  Medications   sacubitril-valsartan (ENTRESTO) 24-26 MG    Sig: Take 1 tablet by mouth 2 (two) times daily.    Dispense:  180 tablet    Refill:  3   predniSONE (DELTASONE) 1 MG tablet    Sig: Take 14 mg a day x 1 month, then 13  mg/day x month, then follow instructions    Dispense:  120 tablet    Refill:  3   predniSONE (DELTASONE) 5 MG tablet    Sig: Take 14 mg a day x 1 month, then 13 mg/day x month, then follow instructions    Dispense:  90 tablet    Refill:  3      Follow-up: Return in about 2 months (around 10/28/2021) for a follow-up visit.  Sonda Primes, MD

## 2021-08-28 NOTE — Assessment & Plan Note (Signed)
Lipitor on hold

## 2021-08-28 NOTE — Assessment & Plan Note (Addendum)
He is not taking Entresto.  He ran out.  He was advised to re-start [facility]

## 2021-08-28 NOTE — Patient Instructions (Signed)
Take Prednisone 14 mg a day x 1 month, then 13 mg/day x month, then follow instructions

## 2021-08-28 NOTE — Assessment & Plan Note (Addendum)
50% better Less stiff in am. Started to walk  Continue with prednisone.  Taper schedule was discussed.  We will reduce the dose by 1mg /day every month.  Follow-up in 1 month

## 2021-08-29 ENCOUNTER — Other Ambulatory Visit: Payer: Self-pay | Admitting: Pharmacist Clinician (PhC)/ Clinical Pharmacy Specialist

## 2021-08-30 ENCOUNTER — Other Ambulatory Visit (INDEPENDENT_AMBULATORY_CARE_PROVIDER_SITE_OTHER): Payer: Medicare Other

## 2021-08-30 DIAGNOSIS — M353 Polymyalgia rheumatica: Secondary | ICD-10-CM | POA: Diagnosis not present

## 2021-08-30 LAB — COMPREHENSIVE METABOLIC PANEL
ALT: 29 U/L (ref 0–53)
AST: 14 U/L (ref 0–37)
Albumin: 3.8 g/dL (ref 3.5–5.2)
Alkaline Phosphatase: 48 U/L (ref 39–117)
BUN: 14 mg/dL (ref 6–23)
CO2: 27 mEq/L (ref 19–32)
Calcium: 9 mg/dL (ref 8.4–10.5)
Chloride: 102 mEq/L (ref 96–112)
Creatinine, Ser: 0.86 mg/dL (ref 0.40–1.50)
GFR: 83.53 mL/min (ref >60.00)
Glucose, Bld: 97 mg/dL (ref 70–99)
Potassium: 4 mEq/L (ref 3.5–5.1)
Sodium: 138 mEq/L (ref 135–145)
Total Bilirubin: 0.7 mg/dL (ref 0.2–1.2)
Total Protein: 6.6 g/dL (ref 6.0–8.3)

## 2021-08-30 LAB — SEDIMENTATION RATE: Sed Rate: 34 mm/hr — ABNORMAL HIGH (ref 0–20)

## 2021-09-01 NOTE — Assessment & Plan Note (Signed)
Continue on Eliquis 

## 2021-09-11 DIAGNOSIS — M24542 Contracture, left hand: Secondary | ICD-10-CM | POA: Insufficient documentation

## 2021-09-12 ENCOUNTER — Telehealth: Payer: Self-pay | Admitting: *Deleted

## 2021-09-12 NOTE — Telephone Encounter (Signed)
   [facility] Pre-operative Risk Assessment    Patient Name: Aidon Klemens  DOB: [birth year] MRN: [id number]  Request for surgical clearance:  What type of surgery is being performed? Manipulation and evaluation under left ring and small finger with repair as necessary  When is this surgery scheduled? 11/13/21  What type of clearance is required (medical clearance vs. Pharmacy clearance to hold med vs. Both)? both  Are there any medications that need to be held prior to surgery and how long? Eliquis  Practice name and name of physician performing surgery? [facility] Dr. Amedeo Plenty  What is the office phone number? [phone]   7.   What is the office fax number? [phone] attn: Sherry  8.   Anesthesia type (None, local, MAC, general) ? Local, in office   [place] 09/12/2021, 11:24 AM  _________________________________________________________________   (provider comments below)

## 2021-09-12 NOTE — Telephone Encounter (Signed)
   Name: Dustin Clayton  DOB: 07/27/1946  MRN: [id number]   Primary Cardiologist: Peter Swaziland, MD  Chart reviewed as part of pre-operative protocol coverage. Patient was contacted 09/12/2021 in reference to pre-operative risk assessment for pending surgery as outlined below.  Dustin Clayton was last seen on 06/19/21 by Dr. Swaziland.  Since that day, Dustin Clayton has done well.  Therefore, based on ACC/AHA guidelines, the patient would be at acceptable risk for the planned procedure without further cardiovascular testing.   Pharmacy to review anticoagulation.   [place], [place] 09/12/2021, 11:54 AM

## 2021-09-13 ENCOUNTER — Ambulatory Visit: Payer: Medicare Other

## 2021-09-13 ENCOUNTER — Ambulatory Visit (INDEPENDENT_AMBULATORY_CARE_PROVIDER_SITE_OTHER): Payer: Medicare Other

## 2021-09-13 ENCOUNTER — Other Ambulatory Visit: Payer: Self-pay

## 2021-09-13 VITALS — BP 110/68 | HR 80 | Temp 98.8°F | Ht 71.0 in | Wt 207.0 lb

## 2021-09-13 DIAGNOSIS — Z0000 Encounter for general adult medical examination without abnormal findings: Secondary | ICD-10-CM

## 2021-09-13 NOTE — Telephone Encounter (Signed)
Patient with diagnosis of A Fib on Eliquis for anticoagulation.    Procedure: Manipulation and evaluation under left ring and small finger with repair as necessary Date of procedure: 11/13/21   CHA2DS2-VASc Score = 4  This indicates a 4.8% annual risk of stroke. The patient's score is based upon: CHF History: 1 HTN History: 1 Diabetes History: 0 Stroke History: 0 Vascular Disease History: 0 Age Score: 2 Gender Score: 0    CrCl 98 mL/min Platelet count 176K  Per office protocol, patient can hold Eliquis for 2 days prior to procedure.

## 2021-09-13 NOTE — Progress Notes (Addendum)
Subjective:   Dustin Clayton is a 75 y.o. male who presents for an Initial Medicare Annual Wellness Visit.  Review of Systems     Cardiac Risk Factors include: advanced age (>69men, >23 women);dyslipidemia;male gender;hypertension     Objective:    Today's Vitals   09/13/21 1022  BP: 110/68  Pulse: 80  Temp: 98.8 F (37.1 C)  SpO2: 96%  Weight: 207 lb (93.9 kg)  Height: 5\' 11"  (1.803 m)   Body mass index is 28.87 kg/m.  Advanced Directives 09/13/2021 07/24/2017 07/20/2017 07/20/2017  Does Patient Have a Medical Advance Directive? No No No No  Would patient like information on creating a medical advance directive? No - Patient declined No - Patient declined Yes (Inpatient - patient requests chaplain consult to create a medical advance directive) -    Current Medications (verified) Outpatient Encounter Medications as of 09/13/2021  Medication Sig   amLODipine (NORVASC) 5 MG tablet Take 5 mg by mouth daily.   apixaban (ELIQUIS) 5 MG TABS tablet Take 1 tablet (5 mg total) by mouth 2 (two) times daily.   Cholecalciferol (VITAMIN D3 PO) Take 1 capsule by mouth daily.   Coenzyme Q10 (CO Q 10 PO) Take 1 capsule by mouth daily.   cyclobenzaprine (FLEXERIL) 5 MG tablet Take 5 mg by mouth 3 (three) times daily as needed for muscle spasms.    metoprolol tartrate (LOPRESSOR) 50 MG tablet Take 1 tablet (50 mg total) by mouth 2 (two) times daily.   predniSONE (DELTASONE) 1 MG tablet Take 14 mg a day x 1 month, then 13 mg/day x month, then follow instructions   predniSONE (DELTASONE) 5 MG tablet Take 14 mg a day x 1 month, then 13 mg/day x month, then follow instructions   acetaminophen (TYLENOL) 650 MG CR tablet Take 650 mg by mouth every 8 (eight) hours as needed for pain. (Patient not taking: Reported on 09/13/2021)   sacubitril-valsartan (ENTRESTO) 24-26 MG Take 1 tablet by mouth 2 (two) times daily. (Patient not taking: Reported on 09/13/2021)   No facility-administered encounter  medications on file as of 09/13/2021.    Allergies (verified) Patient has no known allergies.   History: Past Medical History:  Diagnosis Date   Acute cholangitis    Alcohol abuse    Arrhythmia    CAD (coronary artery disease), native coronary artery    Common bile duct calculus    Duodenal ulcer    Gastritis    HTN (hypertension)    Hypercholesterolemia    Thrombocytopenia (HCC)    Past Surgical History:  Procedure Laterality Date   APPENDECTOMY     ERCP N/A 07/24/2017   Procedure: ENDOSCOPIC RETROGRADE CHOLANGIOPANCREATOGRAPHY (ERCP);  Surgeon: 07/26/2017, MD;  Location[facility] ENDOSCOPY;  Service: Endoscopy;  Laterality: N/A;   EYE SURGERY     LEFT HEART CATH AND CORONARY ANGIOGRAPHY N/A 07/20/2017   Procedure: LEFT HEART CATH AND CORONARY ANGIOGRAPHY;  Surgeon: 07/22/2017, MD;  Location: [facility] INVASIVE CV LAB;  Service: Cardiovascular;  Laterality: N/A;   TONSILLECTOMY     Family History  Problem Relation Age of Onset   Cancer Mother    Prostate cancer Brother    CAD Neg Hx    Social History   Socioeconomic History   Marital status: Married    Spouse name: Not on file   Number of children: 1   Years of education: Not on file   Highest education level: Not on file  Occupational History   Not on  file  Tobacco Use   Smoking status: Former    Types: Cigarettes    Quit date: 11/03/1984    Years since quitting: 36.8   Smokeless tobacco: Never  Substance and Sexual Activity   Alcohol use: Yes    Alcohol/week: 15.0 standard drinks    Types: 14 Glasses of wine, 1 Shots of liquor per week   Drug use: No   Sexual activity: Yes  Other Topics Concern   Not on file  Social History Narrative   Not on file   Social Determinants of Health   Financial Resource Strain: Low Risk    Difficulty of Paying Living Expenses: Not hard at all  Food Insecurity: No Food Insecurity   Worried About Programme researcher, broadcasting/film/video in the Last Year: Never true   Ran Out of Food in the  Last Year: Never true  Transportation Needs: No Transportation Needs   Lack of Transportation (Medical): No   Lack of Transportation (Non-Medical): No  Physical Activity: Insufficiently Active   Days of Exercise per Week: 7 days   Minutes of Exercise per Session: 20 min  Stress: No Stress Concern Present   Feeling of Stress : Not at all  Social Connections: Moderately Isolated   Frequency of Communication with Friends and Family: Twice a week   Frequency of Social Gatherings with Friends and Family: Twice a week   Attends Religious Services: Never   Diplomatic Services operational officer: No   Attends Engineer, structural: Never   Marital Status: Married    Tobacco Counseling Counseling given: Not Answered   Clinical Intake:  Pre-visit preparation completed: Yes  Pain : No/denies pain     Nutritional Risks: None Diabetes: No  How often do you need to have someone help you when you read instructions, pamphlets, or other written materials from your doctor or pharmacy?: 1 - Never What is the last grade level you completed in school?: PHD  Diabetic?no  Interpreter Needed?: No  Information entered by :: L.Creola Krotz,LPN   Activities of Daily Living In your present state of health, do you have any difficulty performing the following activities: 09/13/2021  Hearing? N  Vision? N  Difficulty concentrating or making decisions? N  Walking or climbing stairs? N  Dressing or bathing? N  Doing errands, shopping? N  Preparing Food and eating ? N  Using the Toilet? N  In the past six months, have you accidently leaked urine? N  Do you have problems with loss of bowel control? N  Managing your Medications? N  Managing your Finances? N  Housekeeping or managing your Housekeeping? N  Some recent data might be hidden    Patient Care Team: Plotnikov, Georgina Quint, MD as PCP - General (Internal Medicine) Swaziland, Peter M, MD as PCP - Cardiology (Cardiology) Swaziland, Peter  M, MD as Consulting Physician (Cardiology) Dominica Severin, MD as Consulting Physician (Orthopedic Surgery)  Indicate any recent Medical Services you may have received from other than [facility] providers in the past year (date may be approximate).     Assessment:   This is a routine wellness examination for Dustin Clayton.  Hearing/Vision screen Vision Screening - Comments:: Annual eye exams wear sglasses   Dietary issues and exercise activities discussed: Current Exercise Habits: Home exercise routine, Type of exercise: walking, Time (Minutes): 20, Frequency (Times/Week): 7, Weekly Exercise (Minutes/Week): 140, Intensity: Mild, Exercise limited by: None identified   Goals Addressed   None    Depression Screen PHQ 2/9 Scores  09/13/2021 09/13/2021 08/12/2021  PHQ - 2 Score 0 0 0  PHQ- 9 Score - - 0    Fall Risk Fall Risk  09/13/2021 08/12/2021  Falls in the past year? 0 1  Number falls in past yr: 0 0  Injury with Fall? 0 0  Risk for fall due to : - Impaired balance/gait  Follow up Falls evaluation completed -  Comment uses cane -    FALL RISK PREVENTION PERTAINING TO THE HOME:  Any stairs in or around the home? Yes  If so, are there any without handrails? No  Home free of loose throw rugs in walkways, pet beds, electrical cords, etc? Yes  Adequate lighting in your home to reduce risk of falls? Yes   ASSISTIVE DEVICES UTILIZED TO PREVENT FALLS:  Life alert? No  Use of a cane, walker or w/c? Yes  Grab bars in the bathroom? Yes  Shower chair or bench in shower? No  Elevated toilet seat or a handicapped toilet? No   TIMED UP AND GO:  Was the test performed? Yes .  Length of time to ambulate 10 feet: 8 sec.   Gait steady and fast without use of assistive device  Cognitive Function:  Normal cognitive status assessed by direct observation by this Nurse Health Advisor. No abnormalities found. '      Immunizations Immunization History  Administered Date(s) Administered    Influenza, High Dose Seasonal PF 07/06/2016, 07/06/2016, 11/30/2017, 09/27/2019   Moderna SARS-COV2 Booster Vaccination 10/05/2020   Pneumococcal Conjugate-13 03/05/2018   Pneumococcal Polysaccharide-23 12/05/2011   Tdap 12/04/2010, 07/08/2016    TDAP status: Up to date  Flu Vaccine status: Due, Education has been provided regarding the importance of this vaccine. Advised may receive this vaccine at local pharmacy or Health Dept. Aware to provide a copy of the vaccination record if obtained from local pharmacy or Health Dept. Verbalized acceptance and understanding.  Pneumococcal vaccine status: Up to date  Covid-19 vaccine status: Completed vaccines  Qualifies for Shingles Vaccine? Yes   Zostavax completed No   Shingrix Completed?: No.    Education has been provided regarding the importance of this vaccine. Patient has been advised to call insurance company to determine out of pocket expense if they have not yet received this vaccine. Advised may also receive vaccine at local pharmacy or Health Dept. Verbalized acceptance and understanding.  Screening Tests Health Maintenance  Topic Date Due   COVID-19 Vaccine (1) 08/31/1946   Zoster Vaccines- Shingrix (1 of 2) Never done   TETANUS/TDAP  07/08/2026   Pneumonia Vaccine 56+ Years old  Completed   Hepatitis C Screening  Completed   HPV VACCINES  Aged Out   INFLUENZA VACCINE  Discontinued    Health Maintenance  Health Maintenance Due  Topic Date Due   COVID-19 Vaccine (1) 08/31/1946   Zoster Vaccines- Shingrix (1 of 2) Never done    Colorectal cancer screening: No longer required.   Lung Cancer Screening: (Low Dose CT Chest recommended if Age 24-80 years, 30 pack-year currently smoking OR have quit w/in 15years.) does not qualify.   Lung Cancer Screening Referral: n/a  Additional Screening:  Hepatitis C Screening: does not qualify;   Vision Screening: Recommended annual ophthalmology exams for early detection of glaucoma  and other disorders of the eye. Is the patient up to date with their annual eye exam?  Yes  Who is the provider or what is the name of the office in which the patient attends annual eye exams?  Dr.Tanner  If pt is not established with a provider, would they like to be referred to a provider to establish care? No .   Dental Screening: Recommended annual dental exams for proper oral hygiene  Community Resource Referral / Chronic Care Management: CRR required this visit?  No   CCM required this visit?  No      Plan:     I have personally reviewed and noted the following in the patient's chart:   Medical and social history Use of alcohol, tobacco or illicit drugs  Current medications and supplements including opioid prescriptions. Patient is not currently taking opioid prescriptions. Functional a bility and status Nutritional status Physical activity Advanced directives List of other physicians Hospitalizations, surgeries, and ER visits in previous 12 months Vitals Screenings to include cognitive, depression, and falls Referrals and appointments  In addition, I have reviewed and discussed with patient certain preventive protocols, quality metrics, and best practice recommendations. A written personalized care plan for preventive services as well as general preventive health recommendations were provided to patient.     March Rummage, LPN   [id number]   Nurse Notes: none   Medical screening examination/treatment/procedure(s) were performed by non-physician practitioner and as supervising physician I was immediately available for consultation/collaboration.  I agree with above. Jacinta Shoe, MD

## 2021-09-13 NOTE — Patient Instructions (Signed)
Mr. Dustin Clayton , Thank you for taking time to come for your Medicare Wellness Visit. I appreciate your ongoing commitment to your health goals. Please review the following plan we discussed and let me know if I can assist you in the future.   Screening recommendations/referrals: Colonoscopy: no longer required  Recommended yearly ophthalmology/optometry visit for glaucoma screening and checkup Recommended yearly dental visit for hygiene and checkup  Vaccinations: Influenza vaccine: due  Pneumococcal vaccine: completed  Tdap vaccine: 07/08/2016 Shingles vaccine: declined     Advanced directives: none   Conditions/risks identified: none   Next appointment: none   Preventive Care 65 Years and Older, Male Preventive care refers to lifestyle choices and visits with your health care provider that can promote health and wellness. What does preventive care include? A yearly physical exam. This is also called an annual well check. Dental exams once or twice a year. Routine eye exams. Ask your health care provider how often you should have your eyes checked. Personal lifestyle choices, including: Daily care of your teeth and gums. Regular physical activity. Eating a healthy diet. Avoiding tobacco and drug use. Limiting alcohol use. Practicing safe sex. Taking low doses of aspirin every day. Taking vitamin and mineral supplements as recommended by your health care provider. What happens during an annual well check? The services and screenings done by your health care provider during your annual well check will depend on your age, overall health, lifestyle risk factors, and family history of disease. Counseling  Your health care provider may ask you questions about your: Alcohol use. Tobacco use. Drug use. Emotional well-being. Home and relationship well-being. Sexual activity. Eating habits. History of falls. Memory and ability to understand (cognition). Work and work  Astronomer. Screening  You may have the following tests or measurements: Height, weight, and BMI. Blood pressure. Lipid and cholesterol levels. These may be checked every 5 years, or more frequently if you are over 36 years old. Skin check. Lung cancer screening. You may have this screening every year starting at age 74 if you have a 30-pack-year history of smoking and currently smoke or have quit within the past 15 years. Fecal occult blood test (FOBT) of the stool. You may have this test every year starting at age 5. Flexible sigmoidoscopy or colonoscopy. You may have a sigmoidoscopy every 5 years or a colonoscopy every 10 years starting at age 52. Prostate cancer screening. Recommendations will vary depending on your family history and other risks. Hepatitis C blood test. Hepatitis B blood test. Sexually transmitted disease (STD) testing. Diabetes screening. This is done by checking your blood sugar (glucose) after you have not eaten for a while (fasting). You may have this done every 1-3 years. Abdominal aortic aneurysm (AAA) screening. You may need this if you are a current or former smoker. Osteoporosis. You may be screened starting at age 66 if you are at high risk. Talk with your health care provider about your test results, treatment options, and if necessary, the need for more tests. Vaccines  Your health care provider may recommend certain vaccines, such as: Influenza vaccine. This is recommended every year. Tetanus, diphtheria, and acellular pertussis (Tdap, Td) vaccine. You may need a Td booster every 10 years. Zoster vaccine. You may need this after age 20. Pneumococcal 13-valent conjugate (PCV13) vaccine. One dose is recommended after age 37. Pneumococcal polysaccharide (PPSV23) vaccine. One dose is recommended after age 58. Talk to your health care provider about which screenings and vaccines you need and how  often you need them. This information is not intended to replace  advice given to you by your health care provider. Make sure you discuss any questions you have with your health care provider. Document Released: 11/16/2015 Document Revised: 07/09/2016 Document Reviewed: 08/21/2015 Elsevier Interactive Patient Education  2017 [place] Prevention in the Home Falls can cause injuries. They can happen to people of all ages. There are many things you can do to make your home safe and to help prevent falls. What can I do on the outside of my home? Regularly fix the edges of walkways and driveways and fix any cracks. Remove anything that might make you trip as you walk through a door, such as a raised step or threshold. Trim any bushes or trees on the path to your home. Use bright outdoor lighting. Clear any walking paths of anything that might make someone trip, such as rocks or tools. Regularly check to see if handrails are loose or broken. Make sure that both sides of any steps have handrails. Any raised decks and porches should have guardrails on the edges. Have any leaves, snow, or ice cleared regularly. Use sand or salt on walking paths during winter. Clean up any spills in your garage right away. This includes oil or grease spills. What can I do in the bathroom? Use night lights. Install grab bars by the toilet and in the tub and shower. Do not use towel bars as grab bars. Use non-skid mats or decals in the tub or shower. If you need to sit down in the shower, use a plastic, non-slip stool. Keep the floor dry. Clean up any water that spills on the floor as soon as it happens. Remove soap buildup in the tub or shower regularly. Attach bath mats securely with double-sided non-slip rug tape. Do not have throw rugs and other things on the floor that can make you trip. What can I do in the bedroom? Use night lights. Make sure that you have a light by your bed that is easy to reach. Do not use any sheets or blankets that are too big for your bed.  They should not hang down onto the floor. Have a firm chair that has side arms. You can use this for support while you get dressed. Do not have throw rugs and other things on the floor that can make you trip. What can I do in the kitchen? Clean up any spills right away. Avoid walking on wet floors. Keep items that you use a lot in easy-to-reach places. If you need to reach something above you, use a strong step stool that has a grab bar. Keep electrical cords out of the way. Do not use floor polish or wax that makes floors slippery. If you must use wax, use non-skid floor wax. Do not have throw rugs and other things on the floor that can make you trip. What can I do with my stairs? Do not leave any items on the stairs. Make sure that there are handrails on both sides of the stairs and use them. Fix handrails that are broken or loose. Make sure that handrails are as long as the stairways. Check any carpeting to make sure that it is firmly attached to the stairs. Fix any carpet that is loose or worn. Avoid having throw rugs at the top or bottom of the stairs. If you do have throw rugs, attach them to the floor with carpet tape. Make sure that you have a  light switch at the top of the stairs and the bottom of the stairs. If you do not have them, ask someone to add them for you. What else can I do to help prevent falls? Wear shoes that: Do not have high heels. Have rubber bottoms. Are comfortable and fit you well. Are closed at the toe. Do not wear sandals. If you use a stepladder: Make sure that it is fully opened. Do not climb a closed stepladder. Make sure that both sides of the stepladder are locked into place. Ask someone to hold it for you, if possible. Clearly mark and make sure that you can see: Any grab bars or handrails. First and last steps. Where the edge of each step is. Use tools that help you move around (mobility aids) if they are needed. These  include: Canes. Walkers. Scooters. Crutches. Turn on the lights when you go into a dark area. Replace any light bulbs as soon as they burn out. Set up your furniture so you have a clear path. Avoid moving your furniture around. If any of your floors are uneven, fix them. If there are any pets around you, be aware of where they are. Review your medicines with your doctor. Some medicines can make you feel dizzy. This can increase your chance of falling. Ask your doctor what other things that you can do to help prevent falls. This information is not intended to replace advice given to you by your health care provider. Make sure you discuss any questions you have with your health care provider. Document Released: 08/16/2009 Document Revised: 03/27/2016 Document Reviewed: 11/24/2014 Elsevier Interactive Patient Education  2017 [employer].

## 2021-09-25 NOTE — Progress Notes (Signed)
Cardiology Office Note   Date:  10/02/2021   ID:  Dustin Clayton, DOB 01/23/1946, MRN [id number]  PCP:  Tresa Garter, MD  Cardiologist:   Valetta Mulroy [place], MD   Chief Complaint  Patient presents with   Atrial Fibrillation   Congestive Heart Failure        History of Present Illness: Dustin Clayton is a 75 y.o. male who is seen for follow up Afib. He was last seen by me in November 2018. He was admitted from 9/17-9/22/18. He presented with acute epigastric pain, nausea, fever, and weakness. Initial Ecg showed ? ST elevation in the septal leads and code STEMI was activated. Troponin was mildly elevated. He underwent emergent cardiac cath that showed chronic occlusion of the distal LCX. There was 60-70% ostial first diagonal stenosis. No clear culprit. EF was normal by cath and Echo. It was felt that troponin elevation was due to demand ischemia and that his presentation was not primarily cardiac. Review of Ecg showed really no change compared to older Ecgs. He had elevated LFTs and underwent ERCP with findings of retained CBD stone. He had ERCP with sphincterotomy. LFTs trended down. Surgical consult recommended as outpatient. He also had gastritis and duodenal ulcers without active bleeding- treated with PPI. He has a history of Etoh abuse and abstinence recommended. Statins were held due to elevated LFTs. Thrombocytopenia also noted but improved.   He reports he has done well since 2018. Never required surgery for his gallbladder. Denies any chest pain. Has some DOE. No palpitations, dizziness or edema. On recent physical in June Ecg showed Afib with rate 87 and a LBBB. No bleeding history. No history of CVA. Echo showed LV dysfunction with EF 45-50%. He also had severe LAE. Restoration of NSR was felt to be unlikely given LAE. He was started on Entresto for LV dysfunction. Amlodipine dose was reduced to allow more BP for titration. In October noted increased arthritic symptoms.  Thought it may be due to statin so this was held to see if symptoms would improve. 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 He denies any palpitations, chest pain, dyspnea. Has not gotten the Entresto yet-  was given 30 day free card and contact information to see about patient assistance- just hasn't looked into it yet.    Past Medical History:  Diagnosis Date   Acute cholangitis    Alcohol abuse    Arrhythmia    CAD (coronary artery disease), native coronary artery    Common bile duct calculus    Duodenal ulcer    Gastritis    HTN (hypertension)    Hypercholesterolemia    Thrombocytopenia (HCC)     Past Surgical History:  Procedure Laterality Date   APPENDECTOMY     ERCP N/A 07/24/2017   Procedure: ENDOSCOPIC RETROGRADE CHOLANGIOPANCREATOGRAPHY (ERCP);  Surgeon: Vida Rigger, MD;  Location: [facility] ENDOSCOPY;  Service: Endoscopy;  Laterality: N/A;   EYE SURGERY     LEFT HEART CATH AND CORONARY ANGIOGRAPHY N/A 07/20/2017   Procedure: LEFT HEART CATH AND CORONARY ANGIOGRAPHY;  Surgeon: Kathleene Hazel, MD;  Location: [facility] INVASIVE CV LAB;  Service: Cardiovascular;  Laterality: N/A;   TONSILLECTOMY       Current Outpatient Medications  Medication Sig Dispense Refill   acetaminophen (TYLENOL) 650 MG CR tablet Take 650 mg by mouth every 8 (eight) hours as needed for pain.     amLODipine (NORVASC) 5 MG  tablet Take 5 mg by mouth daily.     apixaban (ELIQUIS) 5 MG TABS tablet Take 1 tablet (5 mg total) by mouth 2 (two) times daily. 180 tablet 3   atorvastatin (LIPITOR) 20 MG tablet Take 1 tablet (20 mg total) by mouth daily. 90 tablet 3   Cholecalciferol (VITAMIN D3 PO) Take 1 capsule by mouth daily.     Coenzyme Q10 (CO Q 10 PO) Take 1 capsule by mouth daily.     cyclobenzaprine (FLEXERIL) 5 MG tablet Take 5 mg by mouth 3 (three) times daily as needed for muscle spasms.      metoprolol tartrate (LOPRESSOR)  50 MG tablet Take 1 tablet (50 mg total) by mouth 2 (two) times daily. 180 tablet 3   predniSONE (DELTASONE) 1 MG tablet Take 14 mg a day x 1 month, then 13 mg/day x month, then follow instructions 120 tablet 3   predniSONE (DELTASONE) 5 MG tablet Take 14 mg a day x 1 month, then 13 mg/day x month, then follow instructions 90 tablet 3   sacubitril-valsartan (ENTRESTO) 24-26 MG Take 1 tablet by mouth 2 (two) times daily. 180 tablet 3   No current facility-administered medications for this visit.    Allergies:   Patient has no known allergies.    Social History:  The patient  reports that he quit smoking about 36 years ago. His smoking use included cigarettes. He has never used smokeless tobacco. He reports current alcohol use of about 15.0 standard drinks per week. He reports that he does not use drugs.   Family History:  The patient's family history includes Cancer in his mother; Prostate cancer in his brother.    ROS:  Please see the history of present illness.   Otherwise, review of systems are positive for none.   All other systems are reviewed and negative.    PHYSICAL EXAM: VS:  BP 134/88   Pulse 87   Ht 5\' 11"  (1.803 m)   Wt 213 lb (96.6 kg)   SpO2 98%   BMI 29.71 kg/m  , BMI Body mass index is 29.71 kg/m. GEN: Well nourished, well developed, in no acute distress  HEENT: normal  Neck: no JVD, carotid bruits, or masses Cardiac: RRR; no murmurs, rubs, or gallops,no edema  Respiratory:  clear to auscultation bilaterally, normal work of breathing GI: soft, nontender, nondistended, + BS MS: no deformity or atrophy  Skin: warm and dry, no rash Neuro:  Strength and sensation are intact Psych: euthymic mood, full affect   EKG:  EKG is not ordered today.     Recent Labs: 08/12/2021: TSH 4.73 08/30/2021: ALT 29; BUN 14; Creatinine, Ser 0.86; Potassium 4.0; Sodium 138    Lipid Panel    Component Value Date/Time   CHOL 202 (H) 07/20/2017 2015   TRIG 80 07/20/2017 2015    HDL 72 07/20/2017 2015   CHOLHDL 2.8 07/20/2017 2015   VLDL 16 07/20/2017 2015   LDLCALC 114 (H) 07/20/2017 2015    Labs dated 04/11/21: cholesterol 224, triglycerides 82, LDL 128, HDL 80. CMET and CBC normal.  Wt Readings from Last 3 Encounters:  10/02/21 213 lb (96.6 kg)  09/13/21 207 lb (93.9 kg)  08/28/21 212 lb 3.2 oz (96.3 kg)      Other studies Reviewed: Additional studies/ records that were reviewed today include:   Echo: 07/21/17: Study Conclusions   - Left ventricle: The cavity size was normal. There was moderate   concentric hypertrophy. Systolic function was normal.  The   estimated ejection fraction was in the range of 55% to 60%. Wall   motion was normal; there were no regional wall motion   abnormalities. Doppler parameters are consistent with abnormal   left ventricular relaxation (grade 1 diastolic dysfunction). - Left atrium: The atrium was mildly dilated. - Right ventricle: The cavity size was mildly dilated. - Atrial septum: No defect or patent foramen ovale was identified.  Cardiac cath: 07/20/17: Procedures   LEFT HEART CATH AND CORONARY ANGIOGRAPHY  Conclusion     Mid Cx to Dist Cx lesion, 100 %stenosed. Mid RCA lesion, 20 %stenosed. Prox Cx lesion, 20 %stenosed. Ost 1st Diag to 1st Diag lesion, 60 %stenosed. Prox LAD lesion, 20 %stenosed. The left ventricular systolic function is normal. LV end diastolic pressure is normal. The left ventricular ejection fraction is greater than 65% by visual estimate. There is no mitral valve regurgitation.   1. There is right to left filling of a small lateral wall branch. This appears to be a total occlusion of the distal Circumflex where the vessel becomes very small in caliber but cannot totally exclude this being a small Diagonal branch. The distal AV groove Circumflex changes quickly to a small caliber vessel with a lateral branch. This could the site of total occlusion. The vascular territory supplied by this  branch is very small.  2. Moderate stenosis in the ostium of the large first Diagonal branch with excellent flow down the vessel. This vessel represents a dual LAD. This lesion does not appear to be ulcerated and doe not appear to be flow limiting.  3. Mild stenosis in the proximal LAD. The mid LAD is tortuous and there is subtle dye staining in this bend with quick dye washout. The LAD continues to the apex.  4. The RCA is a large dominant vessel with mild plaque. The distal right gives off a collateral to a very small lateral wall vessel.  5. Normal LV systolic function 6. 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 As above, LBBB induced with the placement of the pigtail catheter in the LV. Echo in am. It is unclear if his presentation is due to the occlusion of the small lateral wall branch. He is at the completion of the cath not having any symptoms.      Echo 07/11/21: IMPRESSIONS     1. Left ventricular ejection fraction, by estimation, is 45 to 50%. The  left ventricle has mildly decreased function. The left ventricle  demonstrates global hypokinesis. There is mild asymmetric left ventricular  hypertrophy of the posterior segment.  Left ventricular diastolic function could not be evaluated.   2. Right ventricular systolic function is normal. The right ventricular  size is moderately enlarged.   3. Left atrial size was severely dilated.   4. Right atrial size was mildly dilated.   5. The pericardial effusion is anterior to the right ventricle.   6. The mitral valve is normal in structure. Trivial mitral valve  regurgitation. No evidence of mitral stenosis.   7. The aortic valve is tricuspid. Aortic valve regurgitation is not  visualized. Mild aortic valve sclerosis is present, with no evidence of  aortic valve stenosis.   8. Aortic dilatation noted. There is mild dilatation of  the aortic root,  measuring 38 mm.   9. The inferior vena cava is normal in size with greater than 50%  respiratory variability  ASSESSMENT AND PLAN:  1.  CAD. Cardiac cath in 2018 showed moderate ostial diagonal disease and chronic occlusion of a small distal LCx vessel. He has no angina. Overall low risk.  2. Hypercholesterolemia. Recommend he resume atorvastatin 20 mg daily since arthralgias are clearly due to PMR. 3. HTN - well controlled on amlodipine and metoprolol. If he is able to obtain the [facility] will start at 24/26 mg bid and stop the amlodipine.  4. Cholangitis due to retained CBD stone. S/p ERCP with resolution of symptoms. 5. Atrial fibrillation persistent. Rate controlled on metoprolol and his is not symptomatic. Will manage with rate control alone. [place] vasc score is 3. Recommend initiation of Elquis 5 mg bid. Discussed benefits of stroke reduction with Afib which outweigh risk of bleeding. Should avoid ASA or NSAIDs.  6. Chronic systolic CHF. EF 45-50%. Recommend continuing metoprolol but add Entresto as above.    Follow up in 6 months.

## 2021-10-02 ENCOUNTER — Encounter: Payer: Self-pay | Admitting: Cardiology

## 2021-10-02 ENCOUNTER — Other Ambulatory Visit: Payer: Self-pay

## 2021-10-02 ENCOUNTER — Ambulatory Visit (INDEPENDENT_AMBULATORY_CARE_PROVIDER_SITE_OTHER): Payer: Medicare Other | Admitting: Cardiology

## 2021-10-02 VITALS — BP 134/88 | HR 87 | Ht 71.0 in | Wt 213.0 lb

## 2021-10-02 DIAGNOSIS — I10 Essential (primary) hypertension: Secondary | ICD-10-CM

## 2021-10-02 DIAGNOSIS — I5022 Chronic systolic (congestive) heart failure: Secondary | ICD-10-CM

## 2021-10-02 DIAGNOSIS — I25118 Atherosclerotic heart disease of native coronary artery with other forms of angina pectoris: Secondary | ICD-10-CM | POA: Diagnosis not present

## 2021-10-02 DIAGNOSIS — I4819 Other persistent atrial fibrillation: Secondary | ICD-10-CM | POA: Diagnosis not present

## 2021-10-02 DIAGNOSIS — E785 Hyperlipidemia, unspecified: Secondary | ICD-10-CM

## 2021-10-02 DIAGNOSIS — I447 Left bundle-branch block, unspecified: Secondary | ICD-10-CM

## 2021-10-02 MED ORDER — ATORVASTATIN CALCIUM 20 MG PO TABS: 20.0000 mg | ORAL_TABLET | Freq: Every day | ORAL | 3 refills | Status: DC

## 2021-10-02 NOTE — Patient Instructions (Signed)
If you are able to obtain the [facility] go ahead and start at 24/26 mg twice a day. When you start this you need to stop amlodipine  Continue Eliquis and Metoprolol  You should resume atorvastatin 20 mg daily.

## 2021-10-29 ENCOUNTER — Ambulatory Visit (INDEPENDENT_AMBULATORY_CARE_PROVIDER_SITE_OTHER): Payer: Medicare Other | Admitting: Internal Medicine

## 2021-10-29 ENCOUNTER — Other Ambulatory Visit: Payer: Self-pay

## 2021-10-29 ENCOUNTER — Encounter: Payer: Self-pay | Admitting: Internal Medicine

## 2021-10-29 VITALS — BP 128/78 | HR 97 | Temp 98.0°F | Ht 71.0 in | Wt 221.4 lb

## 2021-10-29 DIAGNOSIS — I509 Heart failure, unspecified: Secondary | ICD-10-CM | POA: Diagnosis not present

## 2021-10-29 DIAGNOSIS — R609 Edema, unspecified: Secondary | ICD-10-CM | POA: Diagnosis not present

## 2021-10-29 DIAGNOSIS — I25118 Atherosclerotic heart disease of native coronary artery with other forms of angina pectoris: Secondary | ICD-10-CM

## 2021-10-29 DIAGNOSIS — R739 Hyperglycemia, unspecified: Secondary | ICD-10-CM | POA: Insufficient documentation

## 2021-10-29 DIAGNOSIS — M353 Polymyalgia rheumatica: Secondary | ICD-10-CM

## 2021-10-29 DIAGNOSIS — I480 Paroxysmal atrial fibrillation: Secondary | ICD-10-CM | POA: Diagnosis not present

## 2021-10-29 MED ORDER — FUROSEMIDE 20 MG PO TABS: 20.0000 mg | ORAL_TABLET | Freq: Every day | ORAL | 2 refills | Status: DC | PRN

## 2021-10-29 MED ORDER — PREDNISONE 5 MG PO TABS: ORAL_TABLET | ORAL | 3 refills | Status: DC

## 2021-10-29 MED ORDER — PREDNISONE 1 MG PO TABS: ORAL_TABLET | ORAL | 3 refills | Status: DC

## 2021-10-29 MED ORDER — AMLODIPINE BESYLATE 5 MG PO TABS: 2.5000 mg | ORAL_TABLET | Freq: Every day | ORAL | 3 refills | Status: DC

## 2021-10-29 NOTE — Assessment & Plan Note (Signed)
A1c Reduce prednisone

## 2021-10-29 NOTE — Progress Notes (Signed)
Subjective:  Patient ID: Dustin Clayton, male    DOB: Jan 28, 1946  Age: 75 y.o. MRN: [id number]  CC: Follow-up   HPI Dustin Clayton presents for PMR, A fib, CHF She is doing much better.  He has been walking a lot outside  Outpatient Medications Prior to Visit  Medication Sig Dispense Refill   acetaminophen (TYLENOL) 650 MG CR tablet Take 650 mg by mouth every 8 (eight) hours as needed for pain.     apixaban (ELIQUIS) 5 MG TABS tablet Take 1 tablet (5 mg total) by mouth 2 (two) times daily. 180 tablet 3   atorvastatin (LIPITOR) 20 MG tablet Take 1 tablet (20 mg total) by mouth daily. 90 tablet 3   Cholecalciferol (VITAMIN D3 PO) Take 1 capsule by mouth daily.     Coenzyme Q10 (CO Q 10 PO) Take 1 capsule by mouth daily.     cyclobenzaprine (FLEXERIL) 5 MG tablet Take 5 mg by mouth 3 (three) times daily as needed for muscle spasms.      metoprolol tartrate (LOPRESSOR) 50 MG tablet Take 1 tablet (50 mg total) by mouth 2 (two) times daily. 180 tablet 3   sacubitril-valsartan (ENTRESTO) 24-26 MG Take 1 tablet by mouth 2 (two) times daily. 180 tablet 3   amLODipine (NORVASC) 5 MG tablet Take 5 mg by mouth daily.     predniSONE (DELTASONE) 1 MG tablet Take 14 mg a day x 1 month, then 13 mg/day x month, then follow instructions 120 tablet 3   predniSONE (DELTASONE) 5 MG tablet Take 14 mg a day x 1 month, then 13 mg/day x month, then follow instructions 90 tablet 3   No facility-administered medications prior to visit.    ROS: Review of Systems  Constitutional:  Negative for appetite change, fatigue and unexpected weight change.  HENT:  Negative for congestion, nosebleeds, sneezing, sore throat and trouble swallowing.   Eyes:  Negative for itching and visual disturbance.  Respiratory:  Negative for cough.   Cardiovascular:  Negative for chest pain, palpitations and leg swelling.  Gastrointestinal:  Negative for abdominal distention, blood in stool, diarrhea and nausea.  Genitourinary:   Negative for frequency and hematuria.  Musculoskeletal:  Negative for back pain, gait problem, joint swelling and neck pain.  Skin:  Negative for rash.  Neurological:  Negative for dizziness, tremors, speech difficulty and weakness.  Psychiatric/Behavioral:  Negative for agitation, dysphoric mood and sleep disturbance. The patient is not nervous/anxious.    Objective:  BP 128/78 (BP Location: Left Arm, Patient Position: Sitting, Cuff Size: Normal)    Pulse 97    Temp 98 F (36.7 C) (Oral)    Ht 5\' 11"  (1.803 m)    Wt 221 lb 6.4 oz (100.4 kg)    SpO2 98%    BMI 30.88 kg/m   BP Readings from Last 3 Encounters:  10/29/21 128/78  10/02/21 134/88  09/13/21 110/68    Wt Readings from Last 3 Encounters:  10/29/21 221 lb 6.4 oz (100.4 kg)  10/02/21 213 lb (96.6 kg)  09/13/21 207 lb (93.9 kg)    Physical Exam Constitutional:      General: He is not in acute distress.    Appearance: He is well-developed. He is obese.     Comments: NAD  Eyes:     Conjunctiva/sclera: Conjunctivae normal.     Pupils: Pupils are equal, round, and reactive to light.  Neck:     Thyroid: No thyromegaly.     Vascular: No JVD.  Cardiovascular:     Rate and Rhythm: Normal rate and regular rhythm.     Heart sounds: Normal heart sounds. No murmur heard.   No friction rub. No gallop.  Pulmonary:     Effort: Pulmonary effort is normal. No respiratory distress.     Breath sounds: Normal breath sounds. No wheezing or rales.  Chest:     Chest wall: No tenderness.  Abdominal:     General: Bowel sounds are normal. There is no distension.     Palpations: Abdomen is soft. There is no mass.     Tenderness: There is no abdominal tenderness. There is no guarding or rebound.  Musculoskeletal:        General: No tenderness. Normal range of motion.     Cervical back: Normal range of motion.  Lymphadenopathy:     Cervical: No cervical adenopathy.  Skin:    General: Skin is warm and dry.     Findings: No rash.   Neurological:     Mental Status: He is alert and oriented to person, place, and time.     Cranial Nerves: No cranial nerve deficit.     Motor: No abnormal muscle tone.     Coordination: Coordination normal.     Gait: Gait normal.     Deep Tendon Reflexes: Reflexes are normal and symmetric.  Psychiatric:        Behavior: Behavior normal.        Thought Content: Thought content normal.        Judgment: Judgment normal.   Ankles are swollen 1+ Gait is fairly stable.  Not using a cane.  Much better balance  Lab Results  Component Value Date   WBC 5.2 07/25/2017   HGB 14.2 07/25/2017   HCT 42.9 07/25/2017   PLT 85 (L) 07/25/2017   GLUCOSE 97 08/30/2021   CHOL 202 (H) 07/20/2017   TRIG 80 07/20/2017   HDL 72 07/20/2017   LDLCALC 114 (H) 07/20/2017   ALT 29 08/30/2021   AST 14 08/30/2021   NA 138 08/30/2021   K 4.0 08/30/2021   CL 102 08/30/2021   CREATININE 0.86 08/30/2021   BUN 14 08/30/2021   CO2 27 08/30/2021   TSH 4.73 08/12/2021   INR 1.00 07/20/2017    US Abdomen Complete  Result Date: 07/21/2017 CLINICAL DATA:  Elevated liver function tests. EXAM: ABDOMEN ULTRASOUND COMPLETE COMPARISON:  None. FINDINGS: Gallbladder: Multiple gallstones are noted with the largest measuring 1.3 cm. No significant gallbladder wall thickening or pericholecystic fluid is noted. Sludge is noted as well. No sonographic Murphy's sign is noted. Common bile duct: Diameter: 5.9 mm which is within normal limits. Liver: 1.1 cm simple cyst is noted in left hepatic lobe. Mildly increased echogenicity of hepatic parenchyma is noted suggesting fatty infiltration or hepatocellular disease. Portal vein is patent on color Doppler imaging with normal direction of blood flow towards the liver. IVC: No abnormality visualized. Pancreas: Visualized portion unremarkable. Spleen: Size and appearance within normal limits. Right Kidney: Length: 12.3 cm. Echogenicity within normal limits. No mass or hydronephrosis  visualized. Left Kidney: Length: 13.1 cm. Echogenicity within normal limits. No mass or hydronephrosis visualized. Abdominal aorta: No aneurysm visualized. Other findings: None. IMPRESSION: Cholelithiasis without definite evidence of cholecystitis. Mildly increased echogenicity of hepatic parenchyma is noted suggesting fatty infiltration or diffuse hepatocellular disease. Small left hepatic cyst is noted. Electronically Signed   By: Lupita Raider, M.D.   On: 07/21/2017 09:54   CT ABDOMEN PELVIS W CONTRAST  Result Date: 07/21/2017 CLINICAL DATA:  Diffuse abdominal pain and distention for several days. Elevated liver function tests. Fever. EXAM: CT ABDOMEN AND PELVIS WITH CONTRAST TECHNIQUE: Multidetector CT imaging of the abdomen and pelvis was performed using the standard protocol following bolus administration of intravenous contrast. CONTRAST:  11mL ISOVUE-300 IOPAMIDOL (ISOVUE-300) INJECTION 61% COMPARISON:  07/21/2017 abdominal sonogram. FINDINGS: Lower chest: Mild patchy tree-in-bud opacities in the dependent right lung base. Subsegmental atelectasis in the dependent lung bases. Hepatobiliary: Normal liver size. A few simple lateral segment left liver lobe cysts, largest 2.0 cm. Several subcentimeter hypodense lesions scattered throughout the liver, too small to characterize, for which no follow-up is required unless the patient has risk factors for liver malignancy. Nondistended gallbladder contains multiple calcified gallstones measuring up to 1.8 cm. No definite gallbladder wall thickening or pericholecystic fluid. No significant intrahepatic biliary ductal dilatation. Common bile duct diameter 6 mm, top-normal. There is a 4 mm calcified stone in the lower third of the common bile duct near the ampulla. There is mild haziness of the fat surrounding the common bile duct. Pancreas: Normal, with no mass or duct dilation. Spleen: Normal size. No mass. Adrenals/Urinary Tract: Normal adrenals. Normal  kidneys with no hydronephrosis and no renal mass. Normal bladder. Stomach/Bowel: Small hiatal hernia. Otherwise nondistended and grossly normal stomach. Normal caliber small bowel with no small bowel wall thickening. Appendectomy. Moderate diffuse colonic diverticulosis, most prominent in the sigmoid colon, with no large bowel wall thickening or pericolonic fat stranding. 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 Patent portal, splenic, hepatic and renal veins. No pathologically enlarged lymph nodes in the abdomen or pelvis. Reproductive: Normal size prostate with nonspecific internal prostatic calcifications. Other: No pneumoperitoneum, ascites or focal fluid collection. Musculoskeletal: No aggressive appearing focal osseous lesions. Moderate thoracolumbar spondylosis, most prominent at L5-S1. IMPRESSION: 1. Cholelithiasis.  No CT findings of acute cholecystitis. 2. Solitary 4 mm choledocholith in the lower third of the common bile duct near the ampulla. CBD diameter 6 mm, top-normal. No intrahepatic biliary ductal dilatation. 3. Haziness of the fat surrounding the common bile duct, cannot exclude ascending cholangitis. 4. Mild patchy tree-in-bud opacities at the dependent right lung base, compatible with mild aspiration or infectious bronchiolitis. 5. 3.1 cm Abdominal Aortic Aneurysm (ICD10-I71.9). Recommend follow-up aortic ultrasound in 3 years. This recommendation follows ACR consensus guidelines: White Paper of the ACR Incidental Findings Committee II on Vascular Findings. J Am Coll Radiol 2013; 10:789-794. 6.  Aortic Atherosclerosis (ICD10-I70.0). 7. Small hiatal hernia. 8. Moderate diffuse colonic diverticulosis. Electronically Signed   By: Ilona Sorrel M.D.   On: 07/21/2017 09:48   CARDIAC CATHETERIZATION  Result Date: 07/20/2017  Mid Cx to Dist Cx lesion, 100 %stenosed.  Mid RCA lesion, 20 %stenosed.  Prox Cx lesion, 20 %stenosed.  Ost 1st  Diag to 1st Diag lesion, 60 %stenosed.  Prox LAD lesion, 20 %stenosed.  The left ventricular systolic function is normal.  LV end diastolic pressure is normal.  The left ventricular ejection fraction is greater than 65% by visual estimate.  There is no mitral valve regurgitation.  1. There is right to left filling of a small lateral wall branch. This appears to be a total occlusion of the distal Circumflex where the vessel becomes very small in caliber but cannot totally exclude this being a small Diagonal branch. The distal AV groove Circumflex changes quickly to a small caliber vessel with a lateral branch. This could the site of total occlusion. The vascular territory  supplied by this branch is very small. 2. Moderate stenosis in the ostium of the large first Diagonal branch with excellent flow down the vessel. This vessel represents a dual LAD. This lesion does not appear to be ulcerated and doe not appear to be flow limiting. 3. Mild stenosis in the proximal LAD. The mid LAD is tortuous and there is subtle dye staining in this bend with quick dye washout. The LAD continues to the apex. 4. The RCA is a large dominant vessel with mild plaque. The distal right gives off a collateral to a very small lateral wall vessel. 5. Normal LV systolic function 6. 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 As above, LBBB induced with the placement of the pigtail catheter in the LV. Echo in am. It is unclear if his presentation is due to the occlusion of the small lateral wall branch. He is at the completion of the cath not having any symptoms.   DG CHEST PORT 1 VIEW  Result Date: 07/21/2017 CLINICAL DATA:  Fevers EXAM: PORTABLE CHEST 1 VIEW COMPARISON:  01/06/2013 FINDINGS: The heart size and mediastinal contours are within normal limits. Both lungs are clear. The visualized skeletal structures are unremarkable.  IMPRESSION: No active disease. Electronically Signed   By: Inez Catalina M.D.   On: 07/21/2017 07:47   ECHOCARDIOGRAM COMPLETE  Result Date: 07/21/2017                            *[facility]*                         [address] [place], [place] [address]                            [phone] ------------------------------------------------------------------- Transthoracic Echocardiography Patient:    Duong, Meservey MR #:       [id number] Study Date: 07/21/2017 Gender:     M Age:        34 Height:     180.3 cm Weight:     101.7 kg BSA:        2.28 m^2 Pt. 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 ------------------------------------------------------------------- History:   PMH:   Myocardial infarction.  Risk factors: Hypertension. ------------------------------------------------------------------- Study Conclusions - Left ventricle: The cavity size was normal. There was moderate   concentric hypertrophy. Systolic function was normal. The   estimated ejection fraction was in the range of 55% to 60%. Wall   motion was normal; there were no regional wall motion   abnormalities. Doppler parameters are consistent with abnormal   left ventricular relaxation (grade 1 diastolic dysfunction). - Left atrium: The atrium was mildly dilated. -  Right ventricle: The cavity size was mildly dilated. - Atrial septum: No defect or patent foramen ovale was identified. ------------------------------------------------------------------- Study data:  No prior study was  available for comparison.  Study status:  Routine.  Procedure:  Transthoracic echocardiography. Image quality was adequate.  Study completion:  There were no complications.          Transthoracic echocardiography.  M-mode, complete 2D, spectral Doppler, and color Doppler.  Birthdate: Patient birthdate: 1945/12/28.  Age:  Patient is 76 yr old.  Sex: Gender: male.    BMI: 31.3 kg/m^2.  Blood pressure:     127/69 Patient status:  Inpatient.  Study date:  Study date: 07/21/2017. Study time: 10:52 AM.  Location:  Bedside. ------------------------------------------------------------------- ------------------------------------------------------------------- Left ventricle:  The cavity size was normal. There was moderate concentric hypertrophy. Systolic function was normal. The estimated ejection fraction was in the range of 55% to 60%. Wall motion was normal; there were no regional wall motion abnormalities. Doppler parameters are consistent with abnormal left ventricular relaxation (grade 1 diastolic dysfunction). There was no evidence of elevated ventricular filling pressure by Doppler parameters. ------------------------------------------------------------------- Aortic valve:   Structurally normal valve. Trileaflet. Cusp separation was normal.  Doppler:  Transvalvular velocity was within the normal range. There was no stenosis. There was no regurgitation. ------------------------------------------------------------------- Aorta:  Aortic root: The aortic root was normal in size. Ascending aorta: The ascending aorta was normal in size. ------------------------------------------------------------------- Mitral valve:   Structurally normal valve.   Leaflet separation was normal.  Doppler:  Transvalvular velocity was within the normal range. There was no evidence for stenosis. There was no regurgitation. ------------------------------------------------------------------- Left atrium:  The atrium was mildly dilated.  ------------------------------------------------------------------- Atrial septum:  No defect or patent foramen ovale was identified.  ------------------------------------------------------------------- Right ventricle:  The cavity size was mildly dilated. Systolic function was normal. ------------------------------------------------------------------- Pulmonic valve:    Structurally normal valve.   Cusp separation was normal.  Doppler:  Transvalvular velocity was within the normal range. There was trivial regurgitation. ------------------------------------------------------------------- Tricuspid valve:   Structurally normal valve.   Leaflet separation was normal.  Doppler:  Transvalvular velocity was within the normal range. There was trivial regurgitation. ------------------------------------------------------------------- Pulmonary artery:   Systolic pressure was within the normal range.  ------------------------------------------------------------------- Right atrium:  The atrium was normal in size. ------------------------------------------------------------------- Pericardium:  There was no pericardial effusion. ------------------------------------------------------------------- Systemic veins: Inferior vena cava: The vessel was normal in size. The respirophasic diameter changes were in the normal range (= 50%), consistent with normal central venous pressure. ------------------------------------------------------------------- Measurements  Left ventricle                          Value        Reference  LV ID, ED, PLAX chordal                 52    mm     43 - 52  LV ID, ES, PLAX chordal                 30    mm     23 - 38  LV fx shortening, PLAX chordal          42    %      >=29  LV PW thickness, ED                     15    mm     ----------  IVS/LV  PW ratio, ED                     1            <=1.3  Stroke volume, 2D                       74    ml     ----------  Stroke volume/bsa, 2D                    32    ml/m^2 ----------  LV e&', lateral                          6.53  cm/s   ----------  LV e&', medial                           4.9   cm/s   ----------  LV e&', average                          5.72  cm/s   ----------   Ventricular septum                      Value        Reference  IVS thickness, ED                       15    mm     ----------   LVOT                                    Value        Reference  LVOT ID, S                              20    mm     ----------  LVOT area                               3.14  cm^2   ----------  LVOT peak velocity, S                   121   cm/s   ----------  LVOT mean velocity, S                   80.7  cm/s   ----------  LVOT VTI, S                             23.6  cm     ----------  LVOT peak gradient, S                   6     mm Hg  ----------   Aorta                                   Value        Reference  Aortic root ID, ED  34    mm     ----------   Left atrium                             Value        Reference  LA ID, A-P, ES                          40    mm     ----------  LA ID/bsa, A-P                          1.75  cm/m^2 <=2.2  LA volume, S                            60    ml     ----------  LA volume/bsa, S                        26.3  ml/m^2 ----------  LA volume, ES, 1-p A4C                  65.2  ml     ----------  LA volume/bsa, ES, 1-p A4C              28.5  ml/m^2 ----------  LA volume, ES, 1-p A2C                  48.9  ml     ----------  LA volume/bsa, ES, 1-p A2C              21.4  ml/m^2 ----------   Mitral valve                            Value        Reference  Mitral deceleration time        (H)     289   ms     150 - 230  Mitral E/A ratio, peak                  0.7          ----------   Pulmonary arteries                      Value        Reference  PA pressure, S, DP                      27    mm Hg  <=30   Tricuspid valve                         Value        Reference  Tricuspid regurg peak velocity          247    cm/s   ----------  Tricuspid peak RV-RA gradient           24    mm Hg  ----------   Right atrium                            Value        Reference  RA ID, S-I, ES, A4C             (  H)     60.3  mm     34 - 49  RA area, ES, A4C                (H)     21.6  cm^2   8.3 - 19.5  RA volume, ES, A/L                      65.2  ml     ----------  RA volume/bsa, ES, A/L                  28.5  ml/m^2 ----------   Systemic veins                          Value        Reference  Estimated CVP                           3     mm Hg  ----------   Right ventricle                         Value        Reference  RV ID, minor axis, ED, A4C              42.95 mm     26 - 43  RV ID, minor axis, ED, A4C base         56    mm     ----------  TAPSE                                   23.1  mm     ----------  RV pressure, S, DP                      27    mm Hg  <=30  RV s&', lateral, S                       15.9  cm/s   ---------- Legend: (L)  and  (H)  mark values outside specified reference range. ------------------------------------------------------------------- Prepared and Electronically Authenticated by Sanda Klein, MD 2018-09-18T13:09:48   Assessment & Plan:   Problem List Items Addressed This Visit     Atrial fibrillation (HCC) - Primary   Relevant Medications   furosemide (LASIX) 20 MG tablet   amLODipine (NORVASC) 5 MG tablet   Other Relevant Orders   Comprehensive metabolic panel   Sedimentation rate   Congestive heart failure (CHF) (HCC)   Relevant Medications   furosemide (LASIX) 20 MG tablet   amLODipine (NORVASC) 5 MG tablet   Edema    Reduce Norvasc Furosemide prn      Hyperglycemia    A1c Reduce prednisone      Relevant Orders   Hemoglobin A1c   PMR (polymyalgia rheumatica) (HCC)    A1c Reduce prednisone         Meds ordered this encounter  Medications   predniSONE (DELTASONE) 1 MG tablet    Sig: Take 12 mg a day x 1 month, then 11 mg/day x month, then follow instructions     Dispense:  120 tablet    Refill:  3   predniSONE (DELTASONE) 5 MG tablet    Sig:  Take 12 mg a day x 1 month, then 11 mg/day x month, then follow instructions    Dispense:  90 tablet    Refill:  3   furosemide (LASIX) 20 MG tablet    Sig: Take 1 tablet (20 mg total) by mouth daily as needed for edema.    Dispense:  30 tablet    Refill:  2   amLODipine (NORVASC) 5 MG tablet    Sig: Take 0.5 tablets (2.5 mg total) by mouth daily.    Dispense:  90 tablet    Refill:  3      Follow-up: Return in about 2 months (around 12/30/2021) for a follow-up visit.  Walker Kehr, MD

## 2021-10-29 NOTE — Assessment & Plan Note (Signed)
Reduce Norvasc Furosemide prn

## 2021-11-14 ENCOUNTER — Encounter: Payer: Self-pay | Admitting: Internal Medicine

## 2021-11-14 NOTE — Telephone Encounter (Signed)
Made lab appt

## 2021-11-18 ENCOUNTER — Other Ambulatory Visit: Payer: Medicare Other

## 2021-11-22 ENCOUNTER — Other Ambulatory Visit: Payer: Self-pay

## 2021-11-22 ENCOUNTER — Other Ambulatory Visit (INDEPENDENT_AMBULATORY_CARE_PROVIDER_SITE_OTHER): Payer: Medicare Other

## 2021-11-22 DIAGNOSIS — R739 Hyperglycemia, unspecified: Secondary | ICD-10-CM | POA: Diagnosis not present

## 2021-11-22 DIAGNOSIS — I480 Paroxysmal atrial fibrillation: Secondary | ICD-10-CM

## 2021-11-22 LAB — COMPREHENSIVE METABOLIC PANEL
ALT: 19 U/L (ref 0–53)
AST: 14 U/L (ref 0–37)
Albumin: 4.1 g/dL (ref 3.5–5.2)
Alkaline Phosphatase: 38 U/L — ABNORMAL LOW (ref 39–117)
BUN: 13 mg/dL (ref 6–23)
CO2: 26 mEq/L (ref 19–32)
Calcium: 9.1 mg/dL (ref 8.4–10.5)
Chloride: 104 mEq/L (ref 96–112)
Creatinine, Ser: 0.84 mg/dL (ref 0.40–1.50)
GFR: 83.99 mL/min (ref >60.00)
Glucose, Bld: 161 mg/dL — ABNORMAL HIGH (ref 70–99)
Potassium: 4 mEq/L (ref 3.5–5.1)
Sodium: 137 mEq/L (ref 135–145)
Total Bilirubin: 0.8 mg/dL (ref 0.2–1.2)
Total Protein: 6.5 g/dL (ref 6.0–8.3)

## 2021-11-22 LAB — SEDIMENTATION RATE: Sed Rate: 5 mm/hr (ref 0–20)

## 2021-11-22 LAB — HEMOGLOBIN A1C: Hgb A1c MFr Bld: 5.5 % (ref 4.6–6.5)

## 2021-11-28 ENCOUNTER — Encounter: Payer: Self-pay | Admitting: Internal Medicine

## 2021-11-28 MED ORDER — AMLODIPINE BESYLATE 5 MG PO TABS: 2.5000 mg | ORAL_TABLET | Freq: Every day | ORAL | 3 refills | Status: DC

## 2021-11-29 ENCOUNTER — Encounter: Payer: Self-pay | Admitting: Internal Medicine

## 2021-12-30 ENCOUNTER — Encounter: Payer: Self-pay | Admitting: Internal Medicine

## 2021-12-30 ENCOUNTER — Ambulatory Visit (INDEPENDENT_AMBULATORY_CARE_PROVIDER_SITE_OTHER): Payer: Medicare Other | Admitting: Internal Medicine

## 2021-12-30 ENCOUNTER — Other Ambulatory Visit: Payer: Self-pay

## 2021-12-30 VITALS — BP 118/82 | HR 95 | Temp 98.5°F | Ht 71.0 in | Wt 216.0 lb

## 2021-12-30 DIAGNOSIS — M25511 Pain in right shoulder: Secondary | ICD-10-CM

## 2021-12-30 DIAGNOSIS — I480 Paroxysmal atrial fibrillation: Secondary | ICD-10-CM

## 2021-12-30 DIAGNOSIS — M353 Polymyalgia rheumatica: Secondary | ICD-10-CM

## 2021-12-30 DIAGNOSIS — R739 Hyperglycemia, unspecified: Secondary | ICD-10-CM | POA: Diagnosis not present

## 2021-12-30 DIAGNOSIS — D696 Thrombocytopenia, unspecified: Secondary | ICD-10-CM

## 2021-12-30 DIAGNOSIS — I509 Heart failure, unspecified: Secondary | ICD-10-CM | POA: Diagnosis not present

## 2021-12-30 DIAGNOSIS — M24549 Contracture, unspecified hand: Secondary | ICD-10-CM

## 2021-12-30 DIAGNOSIS — M25512 Pain in left shoulder: Secondary | ICD-10-CM

## 2021-12-30 MED ORDER — AMLODIPINE BESYLATE 2.5 MG PO TABS: 2.5000 mg | ORAL_TABLET | Freq: Every day | ORAL | 3 refills | Status: DC

## 2021-12-30 NOTE — Assessment & Plan Note (Signed)
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

## 2021-12-30 NOTE — Assessment & Plan Note (Signed)
On Entresto 

## 2021-12-30 NOTE — Progress Notes (Signed)
Subjective:  Patient ID: Dustin Clayton, male    DOB: 1946-08-01  Age: 76 y.o. MRN: [id number]  CC: No chief complaint on file.   HPI Itiel Borner presents for CHF, PMR, A fib   Outpatient Medications Prior to Visit  Medication Sig Dispense Refill   acetaminophen (TYLENOL) 650 MG CR tablet Take 650 mg by mouth every 8 (eight) hours as needed for pain.     apixaban (ELIQUIS) 5 MG TABS tablet Take 1 tablet (5 mg total) by mouth 2 (two) times daily. 180 tablet 3   atorvastatin (LIPITOR) 20 MG tablet Take 1 tablet (20 mg total) by mouth daily. 90 tablet 3   Cholecalciferol (VITAMIN D3 PO) Take 1 capsule by mouth daily.     Coenzyme Q10 (CO Q 10 PO) Take 1 capsule by mouth daily.     cyclobenzaprine (FLEXERIL) 5 MG tablet Take 5 mg by mouth 3 (three) times daily as needed for muscle spasms.      furosemide (LASIX) 20 MG tablet Take 1 tablet (20 mg total) by mouth daily as needed for edema. 30 tablet 2   metoprolol tartrate (LOPRESSOR) 50 MG tablet Take 1 tablet (50 mg total) by mouth 2 (two) times daily. 180 tablet 3   predniSONE (DELTASONE) 1 MG tablet Take 12 mg a day x 1 month, then 11 mg/day x month, then follow instructions 120 tablet 3   predniSONE (DELTASONE) 5 MG tablet Take 12 mg a day x 1 month, then 11 mg/day x month, then follow instructions 90 tablet 3   sacubitril-valsartan (ENTRESTO) 24-26 MG Take 1 tablet by mouth 2 (two) times daily. 180 tablet 3   amLODipine (NORVASC) 5 MG tablet Take 0.5 tablets (2.5 mg total) by mouth daily. 45 tablet 3   No facility-administered medications prior to visit.    ROS: Review of Systems  Constitutional:  Negative for appetite change, fatigue and unexpected weight change.  HENT:  Negative for congestion, nosebleeds, sneezing, sore throat and trouble swallowing.   Eyes:  Negative for itching and visual disturbance.  Respiratory:  Negative for cough.   Cardiovascular:  Negative for chest pain, palpitations and leg swelling.   Gastrointestinal:  Negative for abdominal distention, blood in stool, diarrhea and nausea.  Genitourinary:  Negative for frequency and hematuria.  Musculoskeletal:  Positive for gait problem. Negative for back pain, joint swelling and neck pain.  Skin:  Negative for rash.  Neurological:  Negative for dizziness, tremors, speech difficulty and weakness.  Psychiatric/Behavioral:  Negative for agitation, dysphoric mood and sleep disturbance. The patient is not nervous/anxious.    Objective:  BP 118/82 (BP Location: Left Arm, Patient Position: Sitting, Cuff Size: Large)    Pulse 95    Temp 98.5 F (36.9 C) (Oral)    Ht 5\' 11"  (1.803 m)    Wt 216 lb (98 kg)    SpO2 96%    BMI 30.13 kg/m   BP Readings from Last 3 Encounters:  12/30/21 118/82  10/29/21 128/78  10/02/21 134/88    Wt Readings from Last 3 Encounters:  12/30/21 216 lb (98 kg)  10/29/21 221 lb 6.4 oz (100.4 kg)  10/02/21 213 lb (96.6 kg)    Physical Exam Constitutional:      General: He is not in acute distress.    Appearance: He is well-developed. He is obese.     Comments: NAD  Eyes:     Conjunctiva/sclera: Conjunctivae normal.     Pupils: Pupils are equal, round, and reactive  to light.  Neck:     Thyroid: No thyromegaly.     Vascular: No JVD.  Cardiovascular:     Rate and Rhythm: Normal rate and regular rhythm.     Heart sounds: Normal heart sounds. No murmur heard.   No friction rub. No gallop.  Pulmonary:     Effort: Pulmonary effort is normal. No respiratory distress.     Breath sounds: Normal breath sounds. No wheezing or rales.  Chest:     Chest wall: No tenderness.  Abdominal:     General: Bowel sounds are normal. There is no distension.     Palpations: Abdomen is soft. There is no mass.     Tenderness: There is no abdominal tenderness. There is no guarding or rebound.  Musculoskeletal:        General: No tenderness. Normal range of motion.     Cervical back: Normal range of motion.  Lymphadenopathy:      Cervical: No cervical adenopathy.  Skin:    General: Skin is warm and dry.     Findings: No rash.  Neurological:     Mental Status: He is alert and oriented to person, place, and time.     Cranial Nerves: No cranial nerve deficit.     Motor: No abnormal muscle tone.     Coordination: Coordination normal.     Gait: Gait normal.     Deep Tendon Reflexes: Reflexes are normal and symmetric.  Psychiatric:        Behavior: Behavior normal.        Thought Content: Thought content normal.        Judgment: Judgment normal.    Lab Results  Component Value Date   WBC 5.2 07/25/2017   HGB 14.2 07/25/2017   HCT 42.9 07/25/2017   PLT 85 (L) 07/25/2017   GLUCOSE 161 (H) 11/22/2021   CHOL 202 (H) 07/20/2017   TRIG 80 07/20/2017   HDL 72 07/20/2017   LDLCALC 114 (H) 07/20/2017   ALT 19 11/22/2021   AST 14 11/22/2021   NA 137 11/22/2021   K 4.0 11/22/2021   CL 104 11/22/2021   CREATININE 0.84 11/22/2021   BUN 13 11/22/2021   CO2 26 11/22/2021   TSH 4.73 08/12/2021   INR 1.00 07/20/2017   HGBA1C 5.5 11/22/2021    US Abdomen Complete  Result Date: 07/21/2017 CLINICAL DATA:  Elevated liver function tests. EXAM: ABDOMEN ULTRASOUND COMPLETE COMPARISON:  None. FINDINGS: Gallbladder: Multiple gallstones are noted with the largest measuring 1.3 cm. No significant gallbladder wall thickening or pericholecystic fluid is noted. Sludge is noted as well. No sonographic Murphy's sign is noted. Common bile duct: Diameter: 5.9 mm which is within normal limits. Liver: 1.1 cm simple cyst is noted in left hepatic lobe. Mildly increased echogenicity of hepatic parenchyma is noted suggesting fatty infiltration or hepatocellular disease. Portal vein is patent on color Doppler imaging with normal direction of blood flow towards the liver. IVC: No abnormality visualized. Pancreas: Visualized portion unremarkable. Spleen: Size and appearance within normal limits. Right Kidney: Length: 12.3 cm. Echogenicity  within normal limits. No mass or hydronephrosis visualized. Left Kidney: Length: 13.1 cm. Echogenicity within normal limits. No mass or hydronephrosis visualized. Abdominal aorta: No aneurysm visualized. Other findings: None. IMPRESSION: Cholelithiasis without definite evidence of cholecystitis. Mildly increased echogenicity of hepatic parenchyma is noted suggesting fatty infiltration or diffuse hepatocellular disease. Small left hepatic cyst is noted. Electronically Signed   By: Marijo Conception, M.D.   On: 07/21/2017  09:54   CT ABDOMEN PELVIS W CONTRAST  Result Date: 07/21/2017 CLINICAL DATA:  Diffuse abdominal pain and distention for several days. Elevated liver function tests. Fever. EXAM: CT ABDOMEN AND PELVIS WITH CONTRAST TECHNIQUE: Multidetector CT imaging of the abdomen and pelvis was performed using the standard protocol following bolus administration of intravenous contrast. CONTRAST:  180mL ISOVUE-300 IOPAMIDOL (ISOVUE-300) INJECTION 61% COMPARISON:  07/21/2017 abdominal sonogram. FINDINGS: Lower chest: Mild patchy tree-in-bud opacities in the dependent right lung base. Subsegmental atelectasis in the dependent lung bases. Hepatobiliary: Normal liver size. A few simple lateral segment left liver lobe cysts, largest 2.0 cm. Several subcentimeter hypodense lesions scattered throughout the liver, too small to characterize, for which no follow-up is required unless the patient has risk factors for liver malignancy. Nondistended gallbladder contains multiple calcified gallstones measuring up to 1.8 cm. No definite gallbladder wall thickening or pericholecystic fluid. No significant intrahepatic biliary ductal dilatation. Common bile duct diameter 6 mm, top-normal. There is a 4 mm calcified stone in the lower third of the common bile duct near the ampulla. There is mild haziness of the fat surrounding the common bile duct. Pancreas: Normal, with no mass or duct dilation. Spleen: Normal size. No mass.  Adrenals/Urinary Tract: Normal adrenals. Normal kidneys with no hydronephrosis and no renal mass. Normal bladder. Stomach/Bowel: Small hiatal hernia. Otherwise nondistended and grossly normal stomach. Normal caliber small bowel with no small bowel wall thickening. Appendectomy. Moderate diffuse colonic diverticulosis, most prominent in the sigmoid colon, with no large bowel wall thickening or pericolonic fat stranding. 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 Patent portal, splenic, hepatic and renal veins. No pathologically enlarged lymph nodes in the abdomen or pelvis. Reproductive: Normal size prostate with nonspecific internal prostatic calcifications. Other: No pneumoperitoneum, ascites or focal fluid collection. Musculoskeletal: No aggressive appearing focal osseous lesions. Moderate thoracolumbar spondylosis, most prominent at L5-S1. IMPRESSION: 1. Cholelithiasis.  No CT findings of acute cholecystitis. 2. Solitary 4 mm choledocholith in the lower third of the common bile duct near the ampulla. CBD diameter 6 mm, top-normal. No intrahepatic biliary ductal dilatation. 3. Haziness of the fat surrounding the common bile duct, cannot exclude ascending cholangitis. 4. Mild patchy tree-in-bud opacities at the dependent right lung base, compatible with mild aspiration or infectious bronchiolitis. 5. 3.1 cm Abdominal Aortic Aneurysm (ICD10-I71.9). Recommend follow-up aortic ultrasound in 3 years. This recommendation follows ACR consensus guidelines: White Paper of the ACR Incidental Findings Committee II on Vascular Findings. J Am Coll Radiol 2013; 10:789-794. 6.  Aortic Atherosclerosis (ICD10-I70.0). 7. Small hiatal hernia. 8. Moderate diffuse colonic diverticulosis. Electronically Signed   By: Ilona Sorrel M.D.   On: 07/21/2017 09:48   CARDIAC CATHETERIZATION  Result Date: 07/20/2017  Mid Cx to Dist Cx lesion, 100 %stenosed.  Mid RCA lesion, 20  %stenosed.  Prox Cx lesion, 20 %stenosed.  Ost 1st Diag to 1st Diag lesion, 60 %stenosed.  Prox LAD lesion, 20 %stenosed.  The left ventricular systolic function is normal.  LV end diastolic pressure is normal.  The left ventricular ejection fraction is greater than 65% by visual estimate.  There is no mitral valve regurgitation.  1. There is right to left filling of a small lateral wall branch. This appears to be a total occlusion of the distal Circumflex where the vessel becomes very small in caliber but cannot totally exclude this being a small Diagonal branch. The distal AV groove Circumflex changes quickly to a small caliber vessel with a lateral branch. This  could the site of total occlusion. The vascular territory supplied by this branch is very small. 2. Moderate stenosis in the ostium of the large first Diagonal branch with excellent flow down the vessel. This vessel represents a dual LAD. This lesion does not appear to be ulcerated and doe not appear to be flow limiting. 3. Mild stenosis in the proximal LAD. The mid LAD is tortuous and there is subtle dye staining in this bend with quick dye washout. The LAD continues to the apex. 4. The RCA is a large dominant vessel with mild plaque. The distal right gives off a collateral to a very small lateral wall vessel. 5. Normal LV systolic function 6. 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 As above, LBBB induced with the placement of the pigtail catheter in the LV. Echo in am. It is unclear if his presentation is due to the occlusion of the small lateral wall branch. He is at the completion of the cath not having any symptoms.   DG CHEST PORT 1 VIEW  Result Date: 07/21/2017 CLINICAL DATA:  Fevers EXAM: PORTABLE CHEST 1 VIEW COMPARISON:  01/06/2013 FINDINGS: The heart size and mediastinal contours are within normal limits. Both lungs are clear.  The visualized skeletal structures are unremarkable. IMPRESSION: No active disease. Electronically Signed   By: Inez Catalina M.D.   On: 07/21/2017 07:47   ECHOCARDIOGRAM COMPLETE  Result Date: 07/21/2017                            *[facility]*                         [address] [place], [place] [address]                            [phone] ------------------------------------------------------------------- Transthoracic Echocardiography Patient:    Elise, Loris MR #:       [id number] Study Date: 07/21/2017 Gender:     M Age:        84 Height:     180.3 cm Weight:     101.7 kg BSA:        2.28 m^2 Pt. 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 ------------------------------------------------------------------- History:   PMH:   Myocardial infarction.  Risk factors: Hypertension. ------------------------------------------------------------------- Study Conclusions - Left ventricle: The cavity size was normal. There was moderate   concentric hypertrophy. Systolic function was normal. The   estimated ejection fraction was in the range of 55% to 60%. Wall   motion was normal; there were no regional wall motion   abnormalities. Doppler parameters are consistent with abnormal   left ventricular relaxation (grade 1 diastolic  dysfunction). - Left atrium: The atrium was mildly dilated. - Right ventricle: The cavity size was mildly dilated. - Atrial septum: No defect or patent foramen ovale was identified.  ------------------------------------------------------------------- Study data:  No prior study was available for comparison.  Study status:  Routine.  Procedure:  Transthoracic echocardiography. Image quality was adequate.  Study completion:  There were no complications.          Transthoracic echocardiography.  M-mode, complete 2D, spectral Doppler, and color Doppler.  Birthdate: Patient birthdate: Jan 14, 1946.  Age:  Patient is 76 yr old.  Sex: Gender: male.    BMI: 31.3 kg/m^2.  Blood pressure:     127/69 Patient status:  Inpatient.  Study date:  Study date: 07/21/2017. Study time: 10:52 AM.  Location:  Bedside. ------------------------------------------------------------------- ------------------------------------------------------------------- Left ventricle:  The cavity size was normal. There was moderate concentric hypertrophy. Systolic function was normal. The estimated ejection fraction was in the range of 55% to 60%. Wall motion was normal; there were no regional wall motion abnormalities. Doppler parameters are consistent with abnormal left ventricular relaxation (grade 1 diastolic dysfunction). There was no evidence of elevated ventricular filling pressure by Doppler parameters. ------------------------------------------------------------------- Aortic valve:   Structurally normal valve. Trileaflet. Cusp separation was normal.  Doppler:  Transvalvular velocity was within the normal range. There was no stenosis. There was no regurgitation. ------------------------------------------------------------------- Aorta:  Aortic root: The aortic root was normal in size. Ascending aorta: The ascending aorta was normal in size. ------------------------------------------------------------------- Mitral valve:   Structurally normal valve.   Leaflet separation was normal.  Doppler:  Transvalvular velocity was within the normal range. There was no evidence for stenosis. There was no regurgitation.  ------------------------------------------------------------------- Left atrium:  The atrium was mildly dilated. ------------------------------------------------------------------- Atrial septum:  No defect or patent foramen ovale was identified.  ------------------------------------------------------------------- Right ventricle:  The cavity size was mildly dilated. Systolic function was normal. ------------------------------------------------------------------- Pulmonic valve:    Structurally normal valve.   Cusp separation was normal.  Doppler:  Transvalvular velocity was within the normal range. There was trivial regurgitation. ------------------------------------------------------------------- Tricuspid valve:   Structurally normal valve.   Leaflet separation was normal.  Doppler:  Transvalvular velocity was within the normal range. There was trivial regurgitation. ------------------------------------------------------------------- Pulmonary artery:   Systolic pressure was within the normal range.  ------------------------------------------------------------------- Right atrium:  The atrium was normal in size. ------------------------------------------------------------------- Pericardium:  There was no pericardial effusion. ------------------------------------------------------------------- Systemic veins: Inferior vena cava: The vessel was normal in size. The respirophasic diameter changes were in the normal range (= 50%), consistent with normal central venous pressure. ------------------------------------------------------------------- Measurements  Left ventricle                          Value        Reference  LV ID, ED, PLAX chordal                 52    mm     43 - 52  LV ID, ES, PLAX chordal                 30    mm     23 - 38  LV fx shortening, PLAX chordal          42    %      >=29  LV PW thickness, ED                     15  mm     ----------  IVS/LV PW ratio, ED                     1             <=1.3  Stroke volume, 2D                       74    ml     ----------  Stroke volume/bsa, 2D                   32    ml/m^2 ----------  LV e&', lateral                          6.53  cm/s   ----------  LV e&', medial                           4.9   cm/s   ----------  LV e&', average                          5.72  cm/s   ----------   Ventricular septum                      Value        Reference  IVS thickness, ED                       15    mm     ----------   LVOT                                    Value        Reference  LVOT ID, S                              20    mm     ----------  LVOT area                               3.14  cm^2   ----------  LVOT peak velocity, S                   121   cm/s   ----------  LVOT mean velocity, S                   80.7  cm/s   ----------  LVOT VTI, S                             23.6  cm     ----------  LVOT peak gradient, S                   6     mm Hg  ----------   Aorta                                   Value        Reference  Aortic root ID, ED  34    mm     ----------   Left atrium                             Value        Reference  LA ID, A-P, ES                          40    mm     ----------  LA ID/bsa, A-P                          1.75  cm/m^2 <=2.2  LA volume, S                            60    ml     ----------  LA volume/bsa, S                        26.3  ml/m^2 ----------  LA volume, ES, 1-p A4C                  65.2  ml     ----------  LA volume/bsa, ES, 1-p A4C              28.5  ml/m^2 ----------  LA volume, ES, 1-p A2C                  48.9  ml     ----------  LA volume/bsa, ES, 1-p A2C              21.4  ml/m^2 ----------   Mitral valve                            Value        Reference  Mitral deceleration time        (H)     289   ms     150 - 230  Mitral E/A ratio, peak                  0.7          ----------   Pulmonary arteries                      Value        Reference  PA pressure, S, DP                      27    mm Hg  <=30    Tricuspid valve                         Value        Reference  Tricuspid regurg peak velocity          247   cm/s   ----------  Tricuspid peak RV-RA gradient           24    mm Hg  ----------   Right atrium                            Value        Reference  RA ID, S-I, ES, A4C             (  H)     60.3  mm     34 - 49  RA area, ES, A4C                (H)     21.6  cm^2   8.3 - 19.5  RA volume, ES, A/L                      65.2  ml     ----------  RA volume/bsa, ES, A/L                  28.5  ml/m^2 ----------   Systemic veins                          Value        Reference  Estimated CVP                           3     mm Hg  ----------   Right ventricle                         Value        Reference  RV ID, minor axis, ED, A4C              42.95 mm     26 - 43  RV ID, minor axis, ED, A4C base         56    mm     ----------  TAPSE                                   23.1  mm     ----------  RV pressure, S, DP                      27    mm Hg  <=30  RV s&', lateral, S                       15.9  cm/s   ---------- Legend: (L)  and  (H)  mark values outside specified reference range. ------------------------------------------------------------------- Prepared and Electronically Authenticated by Sanda Klein, MD 2018-09-18T13:09:48   Assessment & Plan:   Problem List Items Addressed This Visit     Atrial fibrillation ([place])    Rate controlled on metoprolol.  Continue on Eliquis Walk 5000 steps a day      Relevant Medications   amLODipine (NORVASC) 2.5 MG tablet   Congestive heart failure (CHF) (HCC)    On Entresto      Relevant Medications   amLODipine (NORVASC) 2.5 MG tablet   Contracture of hand    Dr Amedeo Plenty L hand      Hyperglycemia   Relevant Orders   Hemoglobin A1c   PMR (polymyalgia rheumatica) (HCC) - Primary    On Prednisone 10 mg a day now - cont w/taper Rheum ref was offered - pt declined RTC 2 mo w/labs      Relevant Orders   Sedimentation rate   Comprehensive metabolic  panel   CBC with Differential/Platelet   Shoulder pain, bilateral    Dr Amedeo Plenty - ?radial nerve damage caused L hand contracture      Thrombocytopenia ([place])    Monitoring CBC  Relevant Orders   CBC with Differential/Platelet      Meds ordered this encounter  Medications   amLODipine (NORVASC) 2.5 MG tablet    Sig: Take 1 tablet (2.5 mg total) by mouth daily.    Dispense:  90 tablet    Refill:  3      Follow-up: Return in about 2 months (around 02/27/2022) for a follow-up visit.  Walker Kehr, MD

## 2021-12-30 NOTE — Assessment & Plan Note (Addendum)
On Prednisone 10 mg a day now - cont w/taper Rheum ref was offered - pt declined RTC 2 mo w/labs

## 2021-12-30 NOTE — Assessment & Plan Note (Signed)
Dr Amanda Pea L hand

## 2021-12-30 NOTE — Assessment & Plan Note (Signed)
Monitoring CBC 

## 2021-12-30 NOTE — Assessment & Plan Note (Signed)
Rate controlled on metoprolol.  Continue on Eliquis Walk 5000 steps a day

## 2021-12-31 LAB — COMPREHENSIVE METABOLIC PANEL
ALT: 20 U/L (ref 0–53)
AST: 20 U/L (ref 0–37)
Albumin: 4.2 g/dL (ref 3.5–5.2)
Alkaline Phosphatase: 45 U/L (ref 39–117)
BUN: 21 mg/dL (ref 6–23)
CO2: 28 mEq/L (ref 19–32)
Calcium: 9.9 mg/dL (ref 8.4–10.5)
Chloride: 103 mEq/L (ref 96–112)
Creatinine, Ser: 1.04 mg/dL (ref 0.40–1.50)
GFR: 69.1 mL/min (ref >60.00)
Glucose, Bld: 118 mg/dL — ABNORMAL HIGH (ref 70–99)
Potassium: 5.4 mEq/L — ABNORMAL HIGH (ref 3.5–5.1)
Sodium: 140 mEq/L (ref 135–145)
Total Bilirubin: 1 mg/dL (ref 0.2–1.2)
Total Protein: 6.9 g/dL (ref 6.0–8.3)

## 2021-12-31 LAB — CBC WITH DIFFERENTIAL/PLATELET
Basophils Absolute: 0.1 10*3/uL (ref 0.0–0.1)
Basophils Relative: 0.7 % (ref 0.0–3.0)
Eosinophils Absolute: 0 10*3/uL (ref 0.0–0.7)
Eosinophils Relative: 0.2 % (ref 0.0–5.0)
HCT: 46.3 % (ref 39.0–52.0)
Hemoglobin: 15.4 g/dL (ref 13.0–17.0)
Lymphocytes Relative: 20.1 % (ref 12.0–46.0)
Lymphs Abs: 1.5 10*3/uL (ref 0.7–4.0)
MCHC: 33.3 g/dL (ref 30.0–36.0)
MCV: 96.6 fl (ref 78.0–100.0)
Monocytes Absolute: 0.4 10*3/uL (ref 0.1–1.0)
Monocytes Relative: 5.8 % (ref 3.0–12.0)
Neutro Abs: 5.3 10*3/uL (ref 1.4–7.7)
Neutrophils Relative %: 73.2 % (ref 43.0–77.0)
Platelets: 180 10*3/uL (ref 150.0–400.0)
RBC: 4.79 Mil/uL (ref 4.22–5.81)
RDW: 15.7 % — ABNORMAL HIGH (ref 11.5–15.5)
WBC: 7.2 10*3/uL (ref 4.0–10.5)

## 2021-12-31 LAB — HEMOGLOBIN A1C: Hgb A1c MFr Bld: 5.8 % (ref 4.6–6.5)

## 2021-12-31 LAB — SEDIMENTATION RATE: Sed Rate: 11 mm/hr (ref 0–20)

## 2022-01-23 ENCOUNTER — Encounter: Payer: Self-pay | Admitting: Internal Medicine

## 2022-01-23 DIAGNOSIS — H3322 Serous retinal detachment, left eye: Secondary | ICD-10-CM | POA: Insufficient documentation

## 2022-02-05 ENCOUNTER — Other Ambulatory Visit: Payer: Self-pay | Admitting: Internal Medicine

## 2022-02-11 NOTE — Telephone Encounter (Signed)
Noted../lmb 

## 2022-02-27 ENCOUNTER — Encounter: Payer: Self-pay | Admitting: Internal Medicine

## 2022-02-27 ENCOUNTER — Ambulatory Visit (INDEPENDENT_AMBULATORY_CARE_PROVIDER_SITE_OTHER): Payer: Medicare Other | Admitting: Internal Medicine

## 2022-02-27 VITALS — BP 118/78 | HR 76 | Temp 97.7°F | Ht 71.0 in | Wt 218.0 lb

## 2022-02-27 DIAGNOSIS — R739 Hyperglycemia, unspecified: Secondary | ICD-10-CM | POA: Diagnosis not present

## 2022-02-27 DIAGNOSIS — M353 Polymyalgia rheumatica: Secondary | ICD-10-CM

## 2022-02-27 MED ORDER — PREDNISONE 1 MG PO TABS: ORAL_TABLET | ORAL | 3 refills | Status: DC

## 2022-02-27 MED ORDER — PREDNISONE 5 MG PO TABS: ORAL_TABLET | ORAL | 0 refills | Status: DC

## 2022-02-27 NOTE — Assessment & Plan Note (Signed)
Now on 5 mg/d of Deltasone - cont to reduce the dose ? ? ?

## 2022-02-27 NOTE — Progress Notes (Signed)
? ?Subjective:  ?Patient ID: Dustin Clayton, male    DOB: [birth year]  Age: 76 y.o. MRN: [id number] ? ?CC: Follow-up (2 month follow-up) ? ? ?HPI ?Dustin Clayton presents for HTN, CAD, PMR ?Doing well ? ?Outpatient Medications Prior to Visit  ?Medication Sig Dispense Refill  ? amLODipine (NORVASC) 2.5 MG tablet Take 1 tablet (2.5 mg total) by mouth daily. 90 tablet 3  ? Cholecalciferol (VITAMIN D3 PO) Take 1 capsule by mouth daily.    ? Coenzyme Q10 (CO Q 10 PO) Take 1 capsule by mouth daily.    ? cyclobenzaprine (FLEXERIL) 5 MG tablet Take 5 mg by mouth 3 (three) times daily as needed for muscle spasms.     ? metoprolol tartrate (LOPRESSOR) 50 MG tablet Take 1 tablet (50 mg total) by mouth 2 (two) times daily. 180 tablet 3  ? predniSONE (DELTASONE) 1 MG tablet Take 12 mg a day x 1 month, then 11 mg/day x month, then follow instructions 120 tablet 3  ? predniSONE (DELTASONE) 5 MG tablet Take 12 mg a day x 1 month, then 11 mg/day x month, then follow instructions 90 tablet 3  ? acetaminophen (TYLENOL) 650 MG CR tablet Take 650 mg by mouth every 8 (eight) hours as needed for pain. (Patient not taking: Reported on 02/27/2022)    ? apixaban (ELIQUIS) 5 MG TABS tablet Take 1 tablet (5 mg total) by mouth 2 (two) times daily. (Patient not taking: Reported on 02/27/2022) 180 tablet 3  ? atorvastatin (LIPITOR) 20 MG tablet Take 1 tablet (20 mg total) by mouth daily. 90 tablet 3  ? furosemide (LASIX) 20 MG tablet TAKE 1 TABLET(20 MG) BY MOUTH DAILY AS NEEDED FOR SWELLING 30 tablet 2  ? sacubitril-valsartan (ENTRESTO) 24-26 MG Take 1 tablet by mouth 2 (two) times daily. (Patient not taking: Reported on 02/27/2022) 180 tablet 3  ? ?No facility-administered medications prior to visit.  ? ? ?ROS: ?Review of Systems  ?Constitutional:  Negative for appetite change, fatigue and unexpected weight change.  ?HENT:  Negative for congestion, nosebleeds, sneezing, sore throat and trouble swallowing.   ?Eyes:  Negative for itching and visual  disturbance.  ?Respiratory:  Negative for cough.   ?Cardiovascular:  Negative for chest pain, palpitations and leg swelling.  ?Gastrointestinal:  Negative for abdominal distention, blood in stool, diarrhea and nausea.  ?Genitourinary:  Negative for frequency and hematuria.  ?Musculoskeletal:  Negative for back pain, gait problem, joint swelling and neck pain.  ?Skin:  Negative for rash.  ?Neurological:  Negative for dizziness, tremors, speech difficulty and weakness.  ?Psychiatric/Behavioral:  Negative for agitation, dysphoric mood, sleep disturbance and suicidal ideas. The patient is not nervous/anxious.   ? ?Objective:  ?BP 118/78 (BP Location: Left Arm)   Pulse 76   Temp 97.7 ?F (36.5 ?C) (Oral)   Ht 5\' 11"  (1.803 m)   Wt 218 lb (98.9 kg)   SpO2 97%   BMI 30.40 kg/m?  ? ?BP Readings from Last 3 Encounters:  ?02/27/22 118/78  ?12/30/21 118/82  ?10/29/21 128/78  ? ? ?Wt Readings from Last 3 Encounters:  ?02/27/22 218 lb (98.9 kg)  ?12/30/21 216 lb (98 kg)  ?10/29/21 221 lb 6.4 oz (100.4 kg)  ? ? ?Physical Exam ?Constitutional:   ?   General: He is not in acute distress. ?   Appearance: He is well-developed.  ?   Comments: NAD  ?Eyes:  ?   Conjunctiva/sclera: Conjunctivae normal.  ?   Pupils: Pupils are equal, round, and reactive  to light.  ?Neck:  ?   Thyroid: No thyromegaly.  ?   Vascular: No JVD.  ?Cardiovascular:  ?   Rate and Rhythm: Normal rate and regular rhythm.  ?   Heart sounds: Normal heart sounds. No murmur heard. ?  No friction rub. No gallop.  ?Pulmonary:  ?   Effort: Pulmonary effort is normal. No respiratory distress.  ?   Breath sounds: Normal breath sounds. No wheezing or rales.  ?Chest:  ?   Chest wall: No tenderness.  ?Abdominal:  ?   General: Bowel sounds are normal. There is no distension.  ?   Palpations: Abdomen is soft. There is no mass.  ?   Tenderness: There is no abdominal tenderness. There is no guarding or rebound.  ?Musculoskeletal:     ?   General: No tenderness. Normal range of  motion.  ?   Cervical back: Normal range of motion.  ?Lymphadenopathy:  ?   Cervical: No cervical adenopathy.  ?Skin: ?   General: Skin is warm and dry.  ?   Findings: No rash.  ?Neurological:  ?   Mental Status: He is alert and oriented to person, place, and time.  ?   Cranial Nerves: No cranial nerve deficit.  ?   Motor: No abnormal muscle tone.  ?   Coordination: Coordination normal.  ?   Gait: Gait normal.  ?   Deep Tendon Reflexes: Reflexes are normal and symmetric.  ?Psychiatric:     ?   Behavior: Behavior normal.     ?   Thought Content: Thought content normal.     ?   Judgment: Judgment normal.  ?Looks well ? ?Lab Results  ?Component Value Date  ? WBC 7.2 12/31/2021  ? HGB 15.4 12/31/2021  ? HCT 46.3 12/31/2021  ? PLT 180.0 12/31/2021  ? GLUCOSE 118 (H) 12/31/2021  ? CHOL 202 (H) 07/20/2017  ? TRIG 80 07/20/2017  ? HDL 72 07/20/2017  ? LDLCALC 114 (H) 07/20/2017  ? ALT 20 12/31/2021  ? AST 20 12/31/2021  ? NA 140 12/31/2021  ? K 5.4 (H) 12/31/2021  ? CL 103 12/31/2021  ? CREATININE 1.04 12/31/2021  ? BUN 21 12/31/2021  ? CO2 28 12/31/2021  ? TSH 4.73 08/12/2021  ? INR 1.00 07/20/2017  ? HGBA1C 5.8 12/31/2021  ? ? ?US Abdomen Complete ? ?Result Date: 07/21/2017 ?CLINICAL DATA:  Elevated liver function tests. EXAM: ABDOMEN ULTRASOUND COMPLETE COMPARISON:  None. FINDINGS: Gallbladder: Multiple gallstones are noted with the largest measuring 1.3 cm. No significant gallbladder wall thickening or pericholecystic fluid is noted. Sludge is noted as well. No sonographic Murphy's sign is noted. Common bile duct: Diameter: 5.9 mm which is within normal limits. Liver: 1.1 cm simple cyst is noted in left hepatic lobe. Mildly increased echogenicity of hepatic parenchyma is noted suggesting fatty infiltration or hepatocellular disease. Portal vein is patent on color Doppler imaging with normal direction of blood flow towards the liver. IVC: No abnormality visualized. Pancreas: Visualized portion unremarkable. Spleen: Size  and appearance within normal limits. Right Kidney: Length: 12.3 cm. Echogenicity within normal limits. No mass or hydronephrosis visualized. Left Kidney: Length: 13.1 cm. Echogenicity within normal limits. No mass or hydronephrosis visualized. Abdominal aorta: No aneurysm visualized. Other findings: None. IMPRESSION: Cholelithiasis without definite evidence of cholecystitis. Mildly increased echogenicity of hepatic parenchyma is noted suggesting fatty infiltration or diffuse hepatocellular disease. Small left hepatic cyst is noted. Electronically Signed   By: Marijo Conception, M.D.   On:  07/21/2017 09:54  ? ?CT ABDOMEN PELVIS W CONTRAST ? ?Result Date: 07/21/2017 ?CLINICAL DATA:  Diffuse abdominal pain and distention for several days. Elevated liver function tests. Fever. EXAM: CT ABDOMEN AND PELVIS WITH CONTRAST TECHNIQUE: Multidetector CT imaging of the abdomen and pelvis was performed using the standard protocol following bolus administration of intravenous contrast. CONTRAST:  176mL ISOVUE-300 IOPAMIDOL (ISOVUE-300) INJECTION 61% COMPARISON:  07/21/2017 abdominal sonogram. FINDINGS: Lower chest: Mild patchy tree-in-bud opacities in the dependent right lung base. Subsegmental atelectasis in the dependent lung bases. Hepatobiliary: Normal liver size. A few simple lateral segment left liver lobe cysts, largest 2.0 cm. Several subcentimeter hypodense lesions scattered throughout the liver, too small to characterize, for which no follow-up is required unless the patient has risk factors for liver malignancy. Nondistended gallbladder contains multiple calcified gallstones measuring up to 1.8 cm. No definite gallbladder wall thickening or pericholecystic fluid. No significant intrahepatic biliary ductal dilatation. Common bile duct diameter 6 mm, top-normal. There is a 4 mm calcified stone in the lower third of the common bile duct near the ampulla. There is mild haziness of the fat surrounding the common bile duct.  Pancreas: Normal, with no mass or duct dilation. Spleen: Normal size. No mass. Adrenals/Urinary Tract: Normal adrenals. Normal kidneys with no hydronephrosis and no renal mass. Normal bladder. Stomach/Bowel: Sm

## 2022-03-10 NOTE — Progress Notes (Signed)
?  ?Cardiology Office Note ? ? ?Date:  03/11/2022  ? ?ID:  Dustin Clayton, DOB April 08, 1946, MRN [id number] ? ?PCP:  Cassandria Anger, MD  ?Cardiologist:   Mahnoor Mathisen [place], MD  ? ?Chief Complaint  ?Patient presents with  ? Atrial Fibrillation  ? Congestive Heart Failure  ? ? ? ? ?  ?History of Present Illness: ?Dustin Clayton is a 76 y.o. male who is seen for follow up Afib. He was admitted from 9/17-9/22/18. He presented with acute epigastric pain, nausea, fever, and weakness. Initial Ecg showed ? ST elevation in the septal leads and code STEMI was activated. Troponin was mildly elevated. He underwent emergent cardiac cath that showed chronic occlusion of the distal LCX. There was 60-70% ostial first diagonal stenosis. No clear culprit. EF was normal by cath and Echo. It was felt that troponin elevation was due to demand ischemia and that his presentation was not primarily cardiac. Review of Ecg showed really no change compared to older Ecgs. He had elevated LFTs and underwent ERCP with findings of retained CBD stone. He had ERCP with sphincterotomy. LFTs trended down. Surgical consult recommended as outpatient. He also had gastritis and duodenal ulcers without active bleeding- treated with PPI. He has a history of Etoh abuse and abstinence recommended. Statins were held due to elevated LFTs. Thrombocytopenia also noted but improved.  ? ?Never required surgery for his gallbladder.  On physical in June 2022 Ecg showed Afib with rate 87 and a LBBB. No bleeding history. No history of CVA. Echo showed LV dysfunction with EF 45-50%. He also had severe LAE. Restoration of NSR was felt to be unlikely given LAE. We recommended Entresto for his reduce EF but he decided not to take.  In October noted increased arthritic symptoms. Thought it may be due to statin so this was held to see if symptoms would improve. 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 He has not resumed statin therapy. We also recommended Eliquis for stroke prophylaxis for his Afib but her deferred. He denies any palpitations, chest pain, dyspnea. Did have a detached retina a month ago and had surgery for this.  ? ? ?Past Medical History:  ?Diagnosis Date  ? Acute cholangitis   ? Alcohol abuse   ? Arrhythmia   ? CAD (coronary artery disease), native coronary artery   ? Common bile duct calculus   ? Duodenal ulcer   ? Gastritis   ? HTN (hypertension)   ? Hypercholesterolemia   ? Thrombocytopenia ([place])   ? ? ?Past Surgical History:  ?Procedure Laterality Date  ? APPENDECTOMY    ? ERCP N/A 07/24/2017  ? Procedure: ENDOSCOPIC RETROGRADE CHOLANGIOPANCREATOGRAPHY (ERCP);  Surgeon: Clarene Essex, MD;  Location: [facility];  Service: Endoscopy;  Laterality: N/A;  ? EYE SURGERY    ? LEFT HEART CATH AND CORONARY ANGIOGRAPHY N/A 07/20/2017  ? Procedure: LEFT HEART CATH AND CORONARY ANGIOGRAPHY;  Surgeon: Burnell Blanks, MD;  Location: [facility];  Service: Cardiovascular;  Laterality: N/A;  ? TONSILLECTOMY    ? ? ? ?Current Outpatient Medications  ?Medication Sig Dispense Refill  ? amLODipine (NORVASC) 2.5 MG tablet Take 1 tablet (2.5 mg total) by mouth daily. 90 tablet 3  ? Cholecalciferol (VITAMIN D3 PO) Take 1 capsule by mouth daily.    ? Coenzyme Q10 (CO Q 10 PO) Take 1 capsule by mouth daily.    ? cyclobenzaprine (FLEXERIL) 5 MG tablet  Take 5 mg by mouth 3 (three) times daily as needed for muscle spasms.     ? dorzolamide-timolol (COSOPT) 22.3-6.8 MG/ML ophthalmic solution Place 1 drop into the left eye 2 (two) times daily.    ? metoprolol tartrate (LOPRESSOR) 50 MG tablet Take 25 mg by mouth daily.    ? prednisoLONE acetate (PRED FORTE) 1 % ophthalmic suspension Place 1 drop into the left eye 2 (two) times daily.    ? predniSONE (DELTASONE) 1 MG tablet Take 3 mg by mouth daily with breakfast.    ? predniSONE (DELTASONE) 5 MG tablet As directed -  follow instructions 90 tablet  0  ? ?No current facility-administered medications for this visit.  ? ? ?Allergies:   Patient has no known allergies.  ? ? ?Social History:  The patient  reports that he quit smoking about 37 years ago. His smoking use included cigarettes. He has never used smokeless tobacco. He reports current alcohol use of about 15.0 standard drinks per week. He reports that he does not use drugs.  ? ?Family History:  The patient's family history includes Cancer in his mother; Prostate cancer in his brother.  ? ? ?ROS:  Please see the history of present illness.   Otherwise, review of systems are positive for none.   All other systems are reviewed and negative.  ? ? ?PHYSICAL EXAM: ?VS:  BP 130/80 (BP Location: Right Arm, Patient Position: Sitting, Cuff Size: Normal)   Pulse 79   Ht 5\' 11"  (1.803 m)   Wt 218 lb (98.9 kg)   SpO2 98%   BMI 30.40 kg/m?  , BMI Body mass index is 30.4 kg/m?. ?GEN: Well nourished, well developed, in no acute distress  ?HEENT: normal  ?Neck: no JVD, carotid bruits, or masses ?Cardiac: IRRR; no murmurs, rubs, or gallops,no edema  ?Respiratory:  clear to auscultation bilaterally, normal work of breathing ?GI: soft, nontender, nondistended, + BS ?MS: no deformity or atrophy  ?Skin: warm and dry, no rash ?Neuro:  Strength and sensation are intact ?Psych: euthymic mood, full affect ? ? ?EKG:  EKG ordered today. Afib with rate 79. LBBB. I have personally reviewed and interpreted this study. ? ? ?Recent Labs: ?08/12/2021: TSH 4.73 ?12/31/2021: ALT 20; BUN 21; Creatinine, Ser 1.04; Hemoglobin 15.4; Platelets 180.0; Potassium 5.4; Sodium 140  ? ? ?Lipid Panel ?   ?Component Value Date/Time  ? CHOL 202 (H) 07/20/2017 2015  ? TRIG 80 07/20/2017 2015  ? HDL 72 07/20/2017 2015  ? CHOLHDL 2.8 07/20/2017 2015  ? VLDL 16 07/20/2017 2015  ? [place] 114 (H) 07/20/2017 2015  ? ? Labs dated 04/11/21: cholesterol 224, triglycerides 82, LDL 128, HDL 80. CMET and CBC normal. ?Dated 07/18/21: cholesterol 143, triglycerides  97, HDL 44, LDL 99.  ? ?Wt Readings from Last 3 Encounters:  ?03/11/22 218 lb (98.9 kg)  ?02/27/22 218 lb (98.9 kg)  ?12/30/21 216 lb (98 kg)  ?  ? ? ?Other studies Reviewed: ?Additional studies/ records that were reviewed today include:  ? ?Echo: 07/21/17: Study Conclusions ?  ?- Left ventricle: The cavity size was normal. There was moderate ?  concentric hypertrophy. Systolic function was normal. The ?  estimated ejection fraction was in the range of 55% to 60%. Wall ?  motion was normal; there were no regional wall motion ?  abnormalities. Doppler parameters are consistent with abnormal ?  left ventricular relaxation (grade 1 diastolic dysfunction). ?- Left atrium: The atrium was mildly dilated. ?- Right ventricle: The  cavity size was mildly dilated. ?- Atrial septum: No defect or patent foramen ovale was identified. ? ?Cardiac cath: 07/20/17: Procedures  ? ?LEFT HEART CATH AND CORONARY ANGIOGRAPHY  ?Conclusion  ? ?  ?Mid Cx to Dist Cx lesion, 100 %stenosed. ?Mid RCA lesion, 20 %stenosed. ?Prox Cx lesion, 20 %stenosed. ?Ost 1st Diag to 1st Diag lesion, 60 %stenosed. ?Prox LAD lesion, 20 %stenosed. ?The left ventricular systolic function is normal. ?LV end diastolic pressure is normal. ?The left ventricular ejection fraction is greater than 65% by visual estimate. ?There is no mitral valve regurgitation. ?  ?1. There is right to left filling of a small lateral wall branch. This appears to be a total occlusion of the distal Circumflex where the vessel becomes very small in caliber but cannot totally exclude this being a small Diagonal branch. The distal AV groove Circumflex changes quickly to a small caliber vessel with a lateral branch. This could the site of total occlusion. The vascular territory supplied by this branch is very small.  ?2. Moderate stenosis in the ostium of the large first Diagonal branch with excellent flow down the vessel. This vessel represents a dual LAD. This lesion does not appear to be  ulcerated and doe not appear to be flow limiting.  ?3. Mild stenosis in the proximal LAD. The mid LAD is tortuous and there is subtle dye staining in this bend with quick dye washout. The LAD continues to the apex.  ?4

## 2022-03-11 ENCOUNTER — Ambulatory Visit (INDEPENDENT_AMBULATORY_CARE_PROVIDER_SITE_OTHER): Payer: Medicare Other | Admitting: Cardiology

## 2022-03-11 ENCOUNTER — Encounter: Payer: Self-pay | Admitting: Cardiology

## 2022-03-11 VITALS — BP 130/80 | HR 79 | Ht 71.0 in | Wt 218.0 lb

## 2022-03-11 DIAGNOSIS — I4819 Other persistent atrial fibrillation: Secondary | ICD-10-CM | POA: Diagnosis not present

## 2022-03-11 DIAGNOSIS — I25118 Atherosclerotic heart disease of native coronary artery with other forms of angina pectoris: Secondary | ICD-10-CM | POA: Diagnosis not present

## 2022-03-11 DIAGNOSIS — I10 Essential (primary) hypertension: Secondary | ICD-10-CM | POA: Diagnosis not present

## 2022-03-11 DIAGNOSIS — E785 Hyperlipidemia, unspecified: Secondary | ICD-10-CM

## 2022-03-11 DIAGNOSIS — I447 Left bundle-branch block, unspecified: Secondary | ICD-10-CM

## 2022-03-12 ENCOUNTER — Other Ambulatory Visit: Payer: Self-pay | Admitting: Internal Medicine

## 2022-03-12 ENCOUNTER — Encounter: Payer: Self-pay | Admitting: Internal Medicine

## 2022-03-12 ENCOUNTER — Other Ambulatory Visit: Payer: Self-pay

## 2022-03-12 MED ORDER — METOPROLOL TARTRATE 50 MG PO TABS
25.0000 mg | ORAL_TABLET | Freq: Every day | ORAL | 1 refills | Status: DC
Start: 2022-03-12 — End: 2022-03-12
  Filled 2022-03-12: qty 45, 90d supply, fill #0

## 2022-03-12 MED ORDER — METOPROLOL TARTRATE 50 MG PO TABS
25.0000 mg | ORAL_TABLET | Freq: Every day | ORAL | 1 refills | Status: DC
Start: 2022-03-12 — End: 2022-07-30

## 2022-03-13 ENCOUNTER — Other Ambulatory Visit: Payer: Self-pay

## 2022-04-29 ENCOUNTER — Encounter: Payer: Self-pay | Admitting: Internal Medicine

## 2022-04-29 ENCOUNTER — Ambulatory Visit (INDEPENDENT_AMBULATORY_CARE_PROVIDER_SITE_OTHER): Payer: Medicare Other | Admitting: Internal Medicine

## 2022-04-29 DIAGNOSIS — Z860100 Personal history of colon polyps, unspecified: Secondary | ICD-10-CM | POA: Insufficient documentation

## 2022-04-29 DIAGNOSIS — M1990 Unspecified osteoarthritis, unspecified site: Secondary | ICD-10-CM

## 2022-04-29 DIAGNOSIS — M353 Polymyalgia rheumatica: Secondary | ICD-10-CM | POA: Diagnosis not present

## 2022-04-29 DIAGNOSIS — M2560 Stiffness of unspecified joint, not elsewhere classified: Secondary | ICD-10-CM

## 2022-04-29 DIAGNOSIS — R932 Abnormal findings on diagnostic imaging of liver and biliary tract: Secondary | ICD-10-CM | POA: Insufficient documentation

## 2022-04-29 DIAGNOSIS — R7401 Elevation of levels of liver transaminase levels: Secondary | ICD-10-CM | POA: Insufficient documentation

## 2022-04-29 DIAGNOSIS — R739 Hyperglycemia, unspecified: Secondary | ICD-10-CM

## 2022-04-29 DIAGNOSIS — I480 Paroxysmal atrial fibrillation: Secondary | ICD-10-CM

## 2022-04-29 DIAGNOSIS — K921 Melena: Secondary | ICD-10-CM | POA: Insufficient documentation

## 2022-04-29 DIAGNOSIS — L723 Sebaceous cyst: Secondary | ICD-10-CM

## 2022-04-29 DIAGNOSIS — R7402 Elevation of levels of lactic acid dehydrogenase (LDH): Secondary | ICD-10-CM | POA: Insufficient documentation

## 2022-04-29 DIAGNOSIS — I25118 Atherosclerotic heart disease of native coronary artery with other forms of angina pectoris: Secondary | ICD-10-CM | POA: Diagnosis not present

## 2022-04-29 DIAGNOSIS — H3322 Serous retinal detachment, left eye: Secondary | ICD-10-CM

## 2022-04-29 LAB — CBC WITH DIFFERENTIAL/PLATELET
Basophils Absolute: 0.1 10*3/uL (ref 0.0–0.1)
Basophils Relative: 1.2 % (ref 0.0–3.0)
Eosinophils Absolute: 0.1 10*3/uL (ref 0.0–0.7)
Eosinophils Relative: 1.2 % (ref 0.0–5.0)
HCT: 46 % (ref 39.0–52.0)
Hemoglobin: 15.1 g/dL (ref 13.0–17.0)
Lymphocytes Relative: 37.8 % (ref 12.0–46.0)
Lymphs Abs: 2.4 10*3/uL (ref 0.7–4.0)
MCHC: 32.8 g/dL (ref 30.0–36.0)
MCV: 101.3 fl — ABNORMAL HIGH (ref 78.0–100.0)
Monocytes Absolute: 0.7 10*3/uL (ref 0.1–1.0)
Monocytes Relative: 10.2 % (ref 3.0–12.0)
Neutro Abs: 3.2 10*3/uL (ref 1.4–7.7)
Neutrophils Relative %: 49.6 % (ref 43.0–77.0)
Platelets: 176 10*3/uL (ref 150.0–400.0)
RBC: 4.54 Mil/uL (ref 4.22–5.81)
RDW: 13.4 % (ref 11.5–15.5)
WBC: 6.4 10*3/uL (ref 4.0–10.5)

## 2022-04-29 LAB — COMPREHENSIVE METABOLIC PANEL
ALT: 17 U/L (ref 0–53)
AST: 18 U/L (ref 0–37)
Albumin: 4.3 g/dL (ref 3.5–5.2)
Alkaline Phosphatase: 46 U/L (ref 39–117)
BUN: 14 mg/dL (ref 6–23)
CO2: 29 mEq/L (ref 19–32)
Calcium: 9.8 mg/dL (ref 8.4–10.5)
Chloride: 105 mEq/L (ref 96–112)
Creatinine, Ser: 0.95 mg/dL (ref 0.40–1.50)
GFR: 76.85 mL/min (ref >60.00)
Glucose, Bld: 129 mg/dL — ABNORMAL HIGH (ref 70–99)
Potassium: 5.1 mEq/L (ref 3.5–5.1)
Sodium: 141 mEq/L (ref 135–145)
Total Bilirubin: 0.9 mg/dL (ref 0.2–1.2)
Total Protein: 6.8 g/dL (ref 6.0–8.3)

## 2022-04-29 LAB — SEDIMENTATION RATE: Sed Rate: 6 mm/hr (ref 0–20)

## 2022-04-29 LAB — HEMOGLOBIN A1C: Hgb A1c MFr Bld: 5.4 % (ref 4.6–6.5)

## 2022-04-29 MED ORDER — DOXYCYCLINE HYCLATE 100 MG PO TABS: 100.0000 mg | ORAL_TABLET | Freq: Two times a day (BID) | ORAL | 0 refills | Status: DC

## 2022-04-29 MED ORDER — PREDNISONE 1 MG PO TABS: ORAL_TABLET | ORAL | 3 refills | Status: DC

## 2022-04-29 NOTE — Assessment & Plan Note (Signed)
Doing well. On Prednisone taper - on 6 mg a day.

## 2022-05-01 ENCOUNTER — Encounter: Payer: Self-pay | Admitting: Internal Medicine

## 2022-05-08 ENCOUNTER — Other Ambulatory Visit (INDEPENDENT_AMBULATORY_CARE_PROVIDER_SITE_OTHER): Payer: Medicare Other

## 2022-05-08 ENCOUNTER — Other Ambulatory Visit: Payer: Self-pay | Admitting: Internal Medicine

## 2022-05-08 DIAGNOSIS — N320 Bladder-neck obstruction: Secondary | ICD-10-CM

## 2022-05-08 LAB — PSA: PSA: 1.01 ng/mL (ref 0.10–4.00)

## 2022-05-08 NOTE — Progress Notes (Signed)
PSA

## 2022-05-23 ENCOUNTER — Other Ambulatory Visit: Payer: Self-pay | Admitting: Internal Medicine

## 2022-05-23 MED ORDER — CYCLOBENZAPRINE HCL 5 MG PO TABS: 5.0000 mg | ORAL_TABLET | Freq: Three times a day (TID) | ORAL | 0 refills | Status: DC | PRN

## 2022-07-30 ENCOUNTER — Encounter: Payer: Self-pay | Admitting: Internal Medicine

## 2022-07-30 ENCOUNTER — Ambulatory Visit (INDEPENDENT_AMBULATORY_CARE_PROVIDER_SITE_OTHER): Payer: Medicare Other | Admitting: Internal Medicine

## 2022-07-30 VITALS — BP 122/70 | HR 87 | Temp 98.0°F | Ht 71.0 in | Wt 225.2 lb

## 2022-07-30 DIAGNOSIS — I509 Heart failure, unspecified: Secondary | ICD-10-CM | POA: Diagnosis not present

## 2022-07-30 DIAGNOSIS — R739 Hyperglycemia, unspecified: Secondary | ICD-10-CM

## 2022-07-30 DIAGNOSIS — E785 Hyperlipidemia, unspecified: Secondary | ICD-10-CM | POA: Diagnosis not present

## 2022-07-30 DIAGNOSIS — M353 Polymyalgia rheumatica: Secondary | ICD-10-CM | POA: Diagnosis not present

## 2022-07-30 DIAGNOSIS — I25118 Atherosclerotic heart disease of native coronary artery with other forms of angina pectoris: Secondary | ICD-10-CM

## 2022-07-30 DIAGNOSIS — I480 Paroxysmal atrial fibrillation: Secondary | ICD-10-CM

## 2022-07-30 DIAGNOSIS — I10 Essential (primary) hypertension: Secondary | ICD-10-CM

## 2022-07-30 LAB — COMPREHENSIVE METABOLIC PANEL
ALT: 16 U/L (ref 0–53)
AST: 15 U/L (ref 0–37)
Albumin: 4.3 g/dL (ref 3.5–5.2)
Alkaline Phosphatase: 51 U/L (ref 39–117)
BUN: 12 mg/dL (ref 6–23)
CO2: 27 mEq/L (ref 19–32)
Calcium: 9.3 mg/dL (ref 8.4–10.5)
Chloride: 102 mEq/L (ref 96–112)
Creatinine, Ser: 0.9 mg/dL (ref 0.40–1.50)
GFR: 81.86 mL/min (ref >60.00)
Glucose, Bld: 101 mg/dL — ABNORMAL HIGH (ref 70–99)
Potassium: 3.9 mEq/L (ref 3.5–5.1)
Sodium: 137 mEq/L (ref 135–145)
Total Bilirubin: 0.9 mg/dL (ref 0.2–1.2)
Total Protein: 7 g/dL (ref 6.0–8.3)

## 2022-07-30 LAB — LIPID PANEL
Cholesterol: 180 mg/dL (ref 0–200)
HDL: 85.8 mg/dL (ref >39.00)
LDL Cholesterol: 66 mg/dL (ref 0–99)
NonHDL: 94.69
Total CHOL/HDL Ratio: 2
Triglycerides: 141 mg/dL (ref 0.0–149.0)
VLDL: 28.2 mg/dL (ref 0.0–40.0)

## 2022-07-30 LAB — HEMOGLOBIN A1C: Hgb A1c MFr Bld: 5.4 % (ref 4.6–6.5)

## 2022-07-30 LAB — SEDIMENTATION RATE: Sed Rate: 10 mm/hr (ref 0–20)

## 2022-07-30 MED ORDER — ATORVASTATIN CALCIUM 10 MG PO TABS: 10.0000 mg | ORAL_TABLET | Freq: Every day | ORAL | 3 refills | Status: DC

## 2022-07-30 MED ORDER — METOPROLOL TARTRATE 25 MG PO TABS: 25.0000 mg | ORAL_TABLET | Freq: Every day | ORAL | 3 refills | Status: DC

## 2022-07-30 MED ORDER — PREDNISONE 1 MG PO TABS: ORAL_TABLET | ORAL | 3 refills | Status: DC

## 2022-07-30 NOTE — Patient Instructions (Signed)
Taper down Prednisone monthly - 4 mg/d - 3mg  - 2mg  - 1 mg - 0

## 2022-07-30 NOTE — Assessment & Plan Note (Signed)
Not taking Entresto, Eliquis

## 2022-07-30 NOTE — Progress Notes (Signed)
Subjective:  Patient ID: Dustin Clayton, male    DOB: 1945-11-18  Age: 76 y.o. MRN: [id number]  CC: Follow-up (3 month f/u)   HPI [name] presents for HTN, dyslipidemia, PMR  Outpatient Medications Prior to Visit  Medication Sig Dispense Refill   amLODipine (NORVASC) 2.5 MG tablet Take 2.5 mg by mouth daily. Take 2.5 mg by mouth daily.     Cholecalciferol (VITAMIN D3 PO) Take 1 capsule by mouth daily.     Coenzyme Q10 (CO Q 10 PO) Take 1 capsule by mouth daily.     cyclobenzaprine (FLEXERIL) 5 MG tablet Take 1 tablet (5 mg total) by mouth 3 (three) times daily as needed for muscle spasms. 30 tablet 0   dorzolamide-timolol (COSOPT) 22.3-6.8 MG/ML ophthalmic solution Place 1 drop into the left eye 2 (two) times daily.     atorvastatin (LIPITOR) 20 MG tablet      metoprolol tartrate (LOPRESSOR) 50 MG tablet Take 0.5 tablets (25 mg total) by mouth daily. 45 tablet 1   predniSONE (DELTASONE) 5 MG tablet As directed -  follow instructions 90 tablet 0   amLODipine (NORVASC) 5 MG tablet  (Patient not taking: Reported on 07/30/2022)     doxycycline (VIBRA-TABS) 100 MG tablet Take 1 tablet (100 mg total) by mouth 2 (two) times daily. 20 tablet 0   furosemide (LASIX) 20 MG tablet Take 20 mg by mouth daily.     predniSONE (DELTASONE) 1 MG tablet As directed pc 100 tablet 3   No facility-administered medications prior to visit.    ROS: Review of Systems  Constitutional:  Negative for appetite change, fatigue and unexpected weight change.  HENT:  Negative for congestion, nosebleeds, sneezing, sore throat and trouble swallowing.   Eyes:  Negative for itching and visual disturbance.  Respiratory:  Negative for cough.   Cardiovascular:  Negative for chest pain, palpitations and leg swelling.  Gastrointestinal:  Negative for abdominal distention, blood in stool, diarrhea and nausea.  Genitourinary:  Negative for frequency and hematuria.  Musculoskeletal:  Negative for back pain, gait  problem, joint swelling and neck pain.  Skin:  Negative for rash and wound.  Neurological:  Negative for dizziness, tremors, speech difficulty and weakness.  Psychiatric/Behavioral:  Negative for agitation, dysphoric mood and sleep disturbance. The patient is not nervous/anxious.     Objective:  BP 122/70 (BP Location: Left Arm)   Pulse 87   Temp 98 F (36.7 C) (Oral)   Ht 5\' 11"  (1.803 m)   Wt 225 lb 3.2 oz (102.2 kg)   SpO2 98%   BMI 31.41 kg/m   BP Readings from Last 3 Encounters:  07/30/22 122/70  04/29/22 120/78  03/11/22 130/80    Wt Readings from Last 3 Encounters:  07/30/22 225 lb 3.2 oz (102.2 kg)  04/29/22 219 lb (99.3 kg)  03/11/22 218 lb (98.9 kg)    Physical Exam Constitutional:      General: He is not in acute distress.    Appearance: He is well-developed. He is obese.     Comments: NAD  Eyes:     Conjunctiva/sclera: Conjunctivae normal.     Pupils: Pupils are equal, round, and reactive to light.  Neck:     Thyroid: No thyromegaly.     Vascular: No JVD.  Cardiovascular:     Rate and Rhythm: Normal rate and regular rhythm.     Heart sounds: Normal heart sounds. No murmur heard.    No friction rub. No gallop.  Pulmonary:  Effort: Pulmonary effort is normal. No respiratory distress.     Breath sounds: Normal breath sounds. No wheezing or rales.  Chest:     Chest wall: No tenderness.  Abdominal:     General: Bowel sounds are normal. There is no distension.     Palpations: Abdomen is soft. There is no mass.     Tenderness: There is no abdominal tenderness. There is no guarding or rebound.  Musculoskeletal:        General: No tenderness. Normal range of motion.     Cervical back: Normal range of motion.  Lymphadenopathy:     Cervical: No cervical adenopathy.  Skin:    General: Skin is warm and dry.     Findings: No rash.  Neurological:     Mental Status: He is alert and oriented to person, place, and time.     Cranial Nerves: No cranial nerve  deficit.     Motor: No abnormal muscle tone.     Coordination: Coordination normal.     Gait: Gait normal.     Deep Tendon Reflexes: Reflexes are normal and symmetric.  Psychiatric:        Behavior: Behavior normal.        Thought Content: Thought content normal.        Judgment: Judgment normal.   In NSR  Lab Results  Component Value Date   WBC 6.4 04/29/2022   HGB 15.1 04/29/2022   HCT 46.0 04/29/2022   PLT 176.0 04/29/2022   GLUCOSE 129 (H) 04/29/2022   CHOL 202 (H) 07/20/2017   TRIG 80 07/20/2017   HDL 72 07/20/2017   LDLCALC 114 (H) 07/20/2017   ALT 17 04/29/2022   AST 18 04/29/2022   NA 141 04/29/2022   K 5.1 04/29/2022   CL 105 04/29/2022   CREATININE 0.95 04/29/2022   BUN 14 04/29/2022   CO2 29 04/29/2022   TSH 4.73 08/12/2021   PSA 1.01 05/08/2022   INR 1.00 07/20/2017   HGBA1C 5.4 04/29/2022    ECHOCARDIOGRAM COMPLETE  Result Date: 07/21/2017                            *[facility]*                         [address] [place], [place] [address]                            [phone] ------------------------------------------------------------------- Transthoracic Echocardiography Patient:    Dustin Clayton MR #:       [id number] Study Date: 07/21/2017 Gender:     M Age:        67 Height:     180.3 cm Weight:     101.7 kg BSA:        2.28 m^2 Pt. 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 ------------------------------------------------------------------- History:   PMH:   Myocardial infarction.  Risk  factors: Hypertension. ------------------------------------------------------------------- Study Conclusions - Left ventricle: The cavity size was normal. There was moderate   concentric hypertrophy. Systolic function was normal. The   estimated ejection fraction was in the range of 55% to 60%. Wall   motion was normal; there were no regional wall motion   abnormalities. Doppler parameters are consistent with abnormal   left ventricular relaxation (grade 1 diastolic dysfunction). - Left atrium: The atrium was mildly dilated. - Right ventricle: The cavity size was mildly dilated. - Atrial septum: No defect or patent foramen ovale was identified. ------------------------------------------------------------------- Study data:  No prior study was available for comparison.  Study status:  Routine.  Procedure:  Transthoracic echocardiography. Image quality was adequate.  Study completion:  There were no complications.          Transthoracic echocardiography.  M-mode, complete 2D, spectral Doppler, and color Doppler.  Birthdate: Patient birthdate: 1946-07-28.  Age:  Patient is 76 yr old.  Sex: Gender: male.    BMI: 31.3 kg/m^2.  Blood pressure:     127/69 Patient status:  Inpatient.  Study date:  Study date: 07/21/2017. Study time: 10:52 AM.  Location:  Bedside. ------------------------------------------------------------------- ------------------------------------------------------------------- Left ventricle:  The cavity size was normal. There was moderate concentric hypertrophy. Systolic function was normal. The estimated ejection fraction was in the range of 55% to 60%. Wall motion was normal; there were no regional wall motion abnormalities. Doppler parameters are consistent with abnormal left ventricular relaxation (grade 1 diastolic dysfunction). There was no evidence of elevated ventricular filling pressure by Doppler parameters. ------------------------------------------------------------------- Aortic valve:    Structurally normal valve. Trileaflet. Cusp separation was normal.  Doppler:  Transvalvular velocity was within the normal range. There was no stenosis. There was no regurgitation. ------------------------------------------------------------------- Aorta:  Aortic root: The aortic root was normal in size. Ascending aorta: The ascending aorta was normal in size. ------------------------------------------------------------------- Mitral valve:   Structurally normal valve.   Leaflet separation was normal.  Doppler:  Transvalvular velocity was within the normal range. There was no evidence for stenosis. There was no regurgitation. ------------------------------------------------------------------- Left atrium:  The atrium was mildly dilated. ------------------------------------------------------------------- Atrial septum:  No defect or patent foramen ovale was identified.  ------------------------------------------------------------------- Right ventricle:  The cavity size was mildly dilated. Systolic function was normal. ------------------------------------------------------------------- Pulmonic valve:    Structurally normal valve.   Cusp separation was normal.  Doppler:  Transvalvular velocity was within the normal range. There was trivial regurgitation. ------------------------------------------------------------------- Tricuspid valve:   Structurally normal valve.   Leaflet separation was normal.  Doppler:  Transvalvular velocity was within the normal range. There was trivial regurgitation. ------------------------------------------------------------------- Pulmonary artery:   Systolic pressure was within the normal range.  ------------------------------------------------------------------- Right atrium:  The atrium was normal in size. ------------------------------------------------------------------- Pericardium:  There was no pericardial effusion.  ------------------------------------------------------------------- Systemic veins: Inferior vena cava: The vessel was normal in size. The respirophasic diameter changes were in the normal range (= 50%), consistent with normal central venous pressure. ------------------------------------------------------------------- Measurements  Left ventricle                          Value        Reference  LV ID, ED, PLAX chordal                 52  mm     43 - 52  LV ID, ES, PLAX chordal                 30    mm     23 - 38  LV fx shortening, PLAX chordal          42    %      >=29  LV PW thickness, ED                     15    mm     ----------  IVS/LV PW ratio, ED                     1            <=1.3  Stroke volume, 2D                       74    ml     ----------  Stroke volume/bsa, 2D                   32    ml/m^2 ----------  LV e&', lateral                          6.53  cm/s   ----------  LV e&', medial                           4.9   cm/s   ----------  LV e&', average                          5.72  cm/s   ----------   Ventricular septum                      Value        Reference  IVS thickness, ED                       15    mm     ----------   LVOT                                    Value        Reference  LVOT ID, S                              20    mm     ----------  LVOT area                               3.14  cm^2   ----------  LVOT peak velocity, S                   121   cm/s   ----------  LVOT mean velocity, S                   80.7  cm/s   ----------  LVOT VTI, S  23.6  cm     ----------  LVOT peak gradient, S                   6     mm Hg  ----------   Aorta                                   Value        Reference  Aortic root ID, ED                      34    mm     ----------   Left atrium                             Value        Reference  LA ID, A-P, ES                          40    mm     ----------  LA ID/bsa, A-P                          1.75  cm/m^2 <=2.2  LA volume,  S                            60    ml     ----------  LA volume/bsa, S                        26.3  ml/m^2 ----------  LA volume, ES, 1-p A4C                  65.2  ml     ----------  LA volume/bsa, ES, 1-p A4C              28.5  ml/m^2 ----------  LA volume, ES, 1-p A2C                  48.9  ml     ----------  LA volume/bsa, ES, 1-p A2C              21.4  ml/m^2 ----------   Mitral valve                            Value        Reference  Mitral deceleration time        (H)     289   ms     150 - 230  Mitral E/A ratio, peak                  0.7          ----------   Pulmonary arteries                      Value        Reference  PA pressure, S, DP                      27    mm Hg  <=30   Tricuspid valve  Value        Reference  Tricuspid regurg peak velocity          247   cm/s   ----------  Tricuspid peak RV-RA gradient           24    mm Hg  ----------   Right atrium                            Value        Reference  RA ID, S-I, ES, A4C             (H)     60.3  mm     34 - 49  RA area, ES, A4C                (H)     21.6  cm^2   8.3 - 19.5  RA volume, ES, A/L                      65.2  ml     ----------  RA volume/bsa, ES, A/L                  28.5  ml/m^2 ----------   Systemic veins                          Value        Reference  Estimated CVP                           3     mm Hg  ----------   Right ventricle                         Value        Reference  RV ID, minor axis, ED, A4C              42.95 mm     26 - 43  RV ID, minor axis, ED, A4C base         56    mm     ----------  TAPSE                                   23.1  mm     ----------  RV pressure, S, DP                      27    mm Hg  <=30  RV s&', lateral, S                       15.9  cm/s   ---------- Legend: (L)  and  (H)  mark values outside specified reference range. ------------------------------------------------------------------- Prepared and Electronically Authenticated by Sanda Klein, MD  2018-09-18T13:09:48  US Abdomen Complete  Result Date: 07/21/2017 CLINICAL DATA:  Elevated liver function tests. EXAM: ABDOMEN ULTRASOUND COMPLETE COMPARISON:  None. FINDINGS: Gallbladder: Multiple gallstones are noted with the largest measuring 1.3 cm. No significant gallbladder wall thickening or pericholecystic fluid is noted. Sludge is noted as well. No sonographic Murphy's sign is noted. Common bile duct: Diameter: 5.9 mm which is within normal limits. Liver: 1.1 cm simple cyst is noted in left hepatic lobe. Mildly  increased echogenicity of hepatic parenchyma is noted suggesting fatty infiltration or hepatocellular disease. Portal vein is patent on color Doppler imaging with normal direction of blood flow towards the liver. IVC: No abnormality visualized. Pancreas: Visualized portion unremarkable. Spleen: Size and appearance within normal limits. Right Kidney: Length: 12.3 cm. Echogenicity within normal limits. No mass or hydronephrosis visualized. Left Kidney: Length: 13.1 cm. Echogenicity within normal limits. No mass or hydronephrosis visualized. Abdominal aorta: No aneurysm visualized. Other findings: None. IMPRESSION: Cholelithiasis without definite evidence of cholecystitis. Mildly increased echogenicity of hepatic parenchyma is noted suggesting fatty infiltration or diffuse hepatocellular disease. Small left hepatic cyst is noted. Electronically Signed   By: Marijo Conception, M.D.   On: 07/21/2017 09:54   CT ABDOMEN PELVIS W CONTRAST  Result Date: 07/21/2017 CLINICAL DATA:  Diffuse abdominal pain and distention for several days. Elevated liver function tests. Fever. EXAM: CT ABDOMEN AND PELVIS WITH CONTRAST TECHNIQUE: Multidetector CT imaging of the abdomen and pelvis was performed using the standard protocol following bolus administration of intravenous contrast. CONTRAST:  151mL ISOVUE-300 IOPAMIDOL (ISOVUE-300) INJECTION 61% COMPARISON:  07/21/2017 abdominal sonogram. FINDINGS: Lower chest:  Mild patchy tree-in-bud opacities in the dependent right lung base. Subsegmental atelectasis in the dependent lung bases. Hepatobiliary: Normal liver size. A few simple lateral segment left liver lobe cysts, largest 2.0 cm. Several subcentimeter hypodense lesions scattered throughout the liver, too small to characterize, for which no follow-up is required unless the patient has risk factors for liver malignancy. Nondistended gallbladder contains multiple calcified gallstones measuring up to 1.8 cm. No definite gallbladder wall thickening or pericholecystic fluid. No significant intrahepatic biliary ductal dilatation. Common bile duct diameter 6 mm, top-normal. There is a 4 mm calcified stone in the lower third of the common bile duct near the ampulla. There is mild haziness of the fat surrounding the common bile duct. Pancreas: Normal, with no mass or duct dilation. Spleen: Normal size. No mass. Adrenals/Urinary Tract: Normal adrenals. Normal kidneys with no hydronephrosis and no renal mass. Normal bladder. Stomach/Bowel: Small hiatal hernia. Otherwise nondistended and grossly normal stomach. Normal caliber small bowel with no small bowel wall thickening. Appendectomy. Moderate diffuse colonic diverticulosis, most prominent in the sigmoid colon, with no large bowel wall thickening or pericolonic fat stranding. 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 Patent portal, splenic, hepatic and renal veins. No pathologically enlarged lymph nodes in the abdomen or pelvis. Reproductive: Normal size prostate with nonspecific internal prostatic calcifications. Other: No pneumoperitoneum, ascites or focal fluid collection. Musculoskeletal: No aggressive appearing focal osseous lesions. Moderate thoracolumbar spondylosis, most prominent at L5-S1. IMPRESSION: 1. Cholelithiasis.  No CT findings of acute cholecystitis. 2. Solitary 4 mm choledocholith in the lower third of  the common bile duct near the ampulla. CBD diameter 6 mm, top-normal. No intrahepatic biliary ductal dilatation. 3. Haziness of the fat surrounding the common bile duct, cannot exclude ascending cholangitis. 4. Mild patchy tree-in-bud opacities at the dependent right lung base, compatible with mild aspiration or infectious bronchiolitis. 5. 3.1 cm Abdominal Aortic Aneurysm (ICD10-I71.9). Recommend follow-up aortic ultrasound in 3 years. This recommendation follows ACR consensus guidelines: White Paper of the ACR Incidental Findings Committee II on Vascular Findings. J Am Coll Radiol 2013; 10:789-794. 6.  Aortic Atherosclerosis (ICD10-I70.0). 7. Small hiatal hernia. 8. Moderate diffuse colonic diverticulosis. Electronically Signed   By: Ilona Sorrel M.D.   On: 07/21/2017 09:48   DG CHEST PORT 1 VIEW  Result Date: 07/21/2017 CLINICAL DATA:  Fevers EXAM: PORTABLE  CHEST 1 VIEW COMPARISON:  01/06/2013 FINDINGS: The heart size and mediastinal contours are within normal limits. Both lungs are clear. The visualized skeletal structures are unremarkable. IMPRESSION: No active disease. Electronically Signed   By: Inez Catalina M.D.   On: 07/21/2017 07:47   CARDIAC CATHETERIZATION  Result Date: 07/20/2017  Mid Cx to Dist Cx lesion, 100 %stenosed.  Mid RCA lesion, 20 %stenosed.  Prox Cx lesion, 20 %stenosed.  Ost 1st Diag to 1st Diag lesion, 60 %stenosed.  Prox LAD lesion, 20 %stenosed.  The left ventricular systolic function is normal.  LV end diastolic pressure is normal.  The left ventricular ejection fraction is greater than 65% by visual estimate.  There is no mitral valve regurgitation.  1. There is right to left filling of a small lateral wall branch. This appears to be a total occlusion of the distal Circumflex where the vessel becomes very small in caliber but cannot totally exclude this being a small Diagonal branch. The distal AV groove Circumflex changes quickly to a small caliber vessel with a  lateral branch. This could the site of total occlusion. The vascular territory supplied by this branch is very small. 2. Moderate stenosis in the ostium of the large first Diagonal branch with excellent flow down the vessel. This vessel represents a dual LAD. This lesion does not appear to be ulcerated and doe not appear to be flow limiting. 3. Mild stenosis in the proximal LAD. The mid LAD is tortuous and there is subtle dye staining in this bend with quick dye washout. The LAD continues to the apex. 4. The RCA is a large dominant vessel with mild plaque. The distal right gives off a collateral to a very small lateral wall vessel. 5. Normal LV systolic function 6. 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 As above, LBBB induced with the placement of the pigtail catheter in the LV. Echo in am. It is unclear if his presentation is due to the occlusion of the small lateral wall branch. He is at the completion of the cath not having any symptoms.    Assessment & Plan:   Problem List Items Addressed This Visit     Atrial fibrillation ([place])    Not taking Eliquis Cont on Metoprolol and Amlodipine      Relevant Medications   amLODipine (NORVASC) 2.5 MG tablet   metoprolol tartrate (LOPRESSOR) 25 MG tablet   atorvastatin (LIPITOR) 10 MG tablet   Congestive heart failure (CHF) (HCC)    Not taking Entresto, Eliquis       Relevant Medications   amLODipine (NORVASC) 2.5 MG tablet   metoprolol tartrate (LOPRESSOR) 25 MG tablet   atorvastatin (LIPITOR) 10 MG tablet   Other Relevant Orders   Comprehensive metabolic panel   Sedimentation rate   Essential hypertension    Cont on Metoprolol and Amlodipine      Relevant Medications   amLODipine (NORVASC) 2.5 MG tablet   metoprolol tartrate (LOPRESSOR) 25 MG tablet   atorvastatin (LIPITOR) 10 MG tablet   Hyperglycemia - Primary   Relevant  Orders   Hemoglobin A1c   PMR (polymyalgia rheumatica) (HCC)    On prednisone Taper down Prednisone monthly - 4 mg/d - 3mg  - 2mg  - 1 mg - 0      Relevant Orders   Sedimentation rate   Other Visit Diagnoses     Dyslipidemia       Relevant  Medications   atorvastatin (LIPITOR) 10 MG tablet   Other Relevant Orders   Lipid panel         Meds ordered this encounter  Medications   predniSONE (DELTASONE) 1 MG tablet    Sig: Taper down Prednisone monthly - 4 mg/d - 3mg /d - 2mg /d - 1 mg/d - then stop. Take pc    Dispense:  120 tablet    Refill:  3   metoprolol tartrate (LOPRESSOR) 25 MG tablet    Sig: Take 1 tablet (25 mg total) by mouth daily.    Dispense:  90 tablet    Refill:  3   atorvastatin (LIPITOR) 10 MG tablet    Sig: Take 1 tablet (10 mg total) by mouth daily.    Dispense:  90 tablet    Refill:  3      Follow-up: Return in about 3 months (around 10/29/2022) for a follow-up visit.  Walker Kehr, MD

## 2022-07-30 NOTE — Assessment & Plan Note (Signed)
On prednisone Taper down Prednisone monthly - 4 mg/d - 3mg  - 2mg  - 1 mg - 0

## 2022-07-30 NOTE — Assessment & Plan Note (Addendum)
Not taking Eliquis Cont on Metoprolol and Amlodipine

## 2022-07-30 NOTE — Assessment & Plan Note (Signed)
Cont on Metoprolol and Amlodipine

## 2022-09-04 ENCOUNTER — Other Ambulatory Visit: Payer: Self-pay | Admitting: Internal Medicine

## 2022-09-04 ENCOUNTER — Encounter: Payer: Self-pay | Admitting: Internal Medicine

## 2022-09-04 MED ORDER — METOPROLOL TARTRATE 25 MG PO TABS: 25.0000 mg | ORAL_TABLET | Freq: Every day | ORAL | 3 refills | Status: DC

## 2022-09-16 ENCOUNTER — Ambulatory Visit (INDEPENDENT_AMBULATORY_CARE_PROVIDER_SITE_OTHER): Payer: Medicare Other

## 2022-09-16 VITALS — Ht 71.0 in | Wt 225.0 lb

## 2022-09-16 DIAGNOSIS — Z0000 Encounter for general adult medical examination without abnormal findings: Secondary | ICD-10-CM | POA: Diagnosis not present

## 2022-09-16 NOTE — Progress Notes (Cosign Needed Addendum)
Virtual Visit via Telephone Note  I connected with  Dustin Clayton on 09/16/22 at 10:30 AM EST by telephone and verified that I am speaking with the correct person using two identifiers.  Location: Patient: Home Provider: [facility] Persons participating in the virtual visit: patient/Nurse Health Advisor   I discussed the limitations, risks, security and privacy concerns of performing an evaluation and management service by telephone and the availability of in person appointments. The patient expressed understanding and agreed to proceed.  Interactive audio and video telecommunications were attempted between this nurse and patient, however failed, due to patient having technical difficulties OR patient did not have access to video capability.  We continued and completed visit with audio only.  Some vital signs may be absent or patient reported.   Mickeal Needy, LPN  Subjective:   Dustin Clayton is a 76 y.o. male who presents for Medicare Annual/Subsequent preventive examination.  Review of Systems     Cardiac Risk Factors include: advanced age (>65men, >64 women);dyslipidemia;male gender;hypertension;obesity (BMI >30kg/m2);sedentary lifestyle     Objective:    Today's Vitals   09/16/22 1031  Weight: 225 lb (102.1 kg)  Height: 5\' 11"  (1.803 m)  PainSc: 0-No pain   Body mass index is 31.38 kg/m.     09/16/2022   10:33 AM 09/13/2021   10:27 AM 07/24/2017    7:02 AM 07/20/2017   10:00 PM 07/20/2017    8:14 PM  Advanced Directives  Does Patient Have a Medical Advance Directive? No No No No No  Would patient like information on creating a medical advance directive? No - Patient declined No - Patient declined No - Patient declined Yes (Inpatient - patient requests chaplain consult to create a medical advance directive)     Current Medications (verified) Outpatient Encounter Medications as of 09/16/2022  Medication Sig   amLODipine (NORVASC) 2.5 MG tablet Take  2.5 mg by mouth daily. Take 2.5 mg by mouth daily.   atorvastatin (LIPITOR) 10 MG tablet Take 1 tablet (10 mg total) by mouth daily.   Cholecalciferol (VITAMIN D3 PO) Take 1 capsule by mouth daily.   Coenzyme Q10 (CO Q 10 PO) Take 1 capsule by mouth daily.   cyclobenzaprine (FLEXERIL) 5 MG tablet Take 1 tablet (5 mg total) by mouth 3 (three) times daily as needed for muscle spasms.   dorzolamide-timolol (COSOPT) 22.3-6.8 MG/ML ophthalmic solution Place 1 drop into the left eye 2 (two) times daily.   metoprolol tartrate (LOPRESSOR) 25 MG tablet Take 1 tablet (25 mg total) by mouth daily.   predniSONE (DELTASONE) 1 MG tablet Taper down Prednisone monthly - 4 mg/d - 3mg /d - 2mg /d - 1 mg/d - then stop. Take pc   No facility-administered encounter medications on file as of 09/16/2022.    Allergies (verified) Patient has no known allergies.   History: Past Medical History:  Diagnosis Date   Acute cholangitis    Alcohol abuse    Arrhythmia    CAD (coronary artery disease), native coronary artery    Common bile duct calculus    Duodenal ulcer    Gastritis    HTN (hypertension)    Hypercholesterolemia    Thrombocytopenia (HCC)    Past Surgical History:  Procedure Laterality Date   APPENDECTOMY     ERCP N/A 07/24/2017   Procedure: ENDOSCOPIC RETROGRADE CHOLANGIOPANCREATOGRAPHY (ERCP);  Surgeon: , MD;  Location: [facility] ENDOSCOPY;  Service: Endoscopy;  Laterality: N/A;   EYE SURGERY     LEFT HEART CATH AND  CORONARY ANGIOGRAPHY N/A 07/20/2017   Procedure: LEFT HEART CATH AND CORONARY ANGIOGRAPHY;  Surgeon: Kathleene Hazel, MD;  Location: [facility] INVASIVE CV LAB;  Service: Cardiovascular;  Laterality: N/A;   TONSILLECTOMY     Family History  Problem Relation Age of Onset   Cancer Mother    Prostate cancer Brother    CAD Neg Hx    Social History   Socioeconomic History   Marital status: Married    Spouse name: Not on file   Number of children: 1   Years of education: Not  on file   Highest education level: Not on file  Occupational History   Not on file  Tobacco Use   Smoking status: Former    Types: Cigarettes    Quit date: 11/03/1984    Years since quitting: 37.8   Smokeless tobacco: Never  Substance and Sexual Activity   Alcohol use: Yes    Alcohol/week: 15.0 standard drinks of alcohol    Types: 14 Glasses of wine, 1 Shots of liquor per week   Drug use: No   Sexual activity: Yes  Other Topics Concern   Not on file  Social History Narrative   Not on file   Social Determinants of Health   Financial Resource Strain: Low Risk  (09/16/2022)   Overall Financial Resource Strain (CARDIA)    Difficulty of Paying Living Expenses: Not very hard  Food Insecurity: No Food Insecurity (09/16/2022)   Hunger Vital Sign    Worried About Running Out of Food in the Last Year: Never true    Ran Out of Food in the Last Year: Never true  Transportation Needs: No Transportation Needs (09/16/2022)   PRAPARE - Administrator, Civil Service (Medical): No    Lack of Transportation (Non-Medical): No  Physical Activity: Insufficiently Active (09/16/2022)   Exercise Vital Sign    Days of Exercise per Week: 1 day    Minutes of Exercise per Session: 10 min  Stress: Stress Concern Present (09/16/2022)   [employer] of Occupational Health - Occupational Stress Questionnaire    Feeling of Stress : To some extent  Social Connections: Moderately Isolated (09/16/2022)   Social Connection and Isolation Panel [NHANES]    Frequency of Communication with Friends and Family: More than three times a week    Frequency of Social Gatherings with Friends and Family: Twice a week    Attends Religious Services: Never    Database administrator or Organizations: No    Attends Engineer, structural: Never    Marital Status: Married    Tobacco Counseling Counseling given: Not Answered   Clinical Intake:  Pre-visit preparation completed: Yes  Pain :  No/denies pain Pain Score: 0-No pain     BMI - recorded: 31.38 Nutritional Status: BMI > 30  Obese Nutritional Risks: None Diabetes: No  How often do you need to have someone help you when you read instructions, pamphlets, or other written materials from your doctor or pharmacy?: 1 - Never What is the last grade level you completed in school?: HSG  Diabetic? no  Interpreter Needed?: No  Information entered by :: Susie Cassette, LPN.   Activities of Daily Living    09/16/2022   10:45 AM 09/15/2022   12:05 PM  In your present state of health, do you have any difficulty performing the following activities:  Hearing? 0 0  Vision? 0 0  Difficulty concentrating or making decisions? 0 0  Walking or climbing stairs?  1 1  Dressing or bathing? 0 0  Doing errands, shopping? 0 0  Preparing Food and eating ? N N  Using the Toilet? N N  In the past six months, have you accidently leaked urine? N N  Do you have problems with loss of bowel control? N N  Managing your Medications? N N  Managing your Finances? N N  Housekeeping or managing your Housekeeping? N N    Patient Care Team: Plotnikov, Georgina Quint, MD as PCP - General (Internal Medicine) [place], Peter M, MD as PCP - Cardiology (Cardiology) [place], Peter M, MD as Consulting Physician (Cardiology) Dominica Severin, MD as Consulting Physician (Orthopedic Surgery) Stephannie Li, MD as Consulting Physician (Ophthalmology) Janet Berlin, MD as Consulting Physician (Ophthalmology)  Indicate any recent Medical Services you may have received from other than [facility] providers in the past year (date may be approximate).     Assessment:   This is a routine wellness examination for Dustin Clayton.  Hearing/Vision screen Hearing Screening - Comments:: Denies hearing difficulties   Vision Screening - Comments:: Wears rx glasses - up to date with routine eye exams with Janet Berlin, MD and Stephannie Li, MD [facility])   Dietary  issues and exercise activities discussed: Current Exercise Habits: The patient does not participate in regular exercise at present;Home exercise routine, Type of exercise: walking, Time (Minutes): 10, Frequency (Times/Week): 1, Weekly Exercise (Minutes/Week): 10, Intensity: Moderate, Exercise limited by: cardiac condition(s)   Goals Addressed             This Visit's Progress    To maintain my current health status by continuing to eat healthy, stay physically active and socially active.        Depression Screen    09/16/2022   10:42 AM 04/29/2022   10:44 AM 02/27/2022   11:24 AM 09/13/2021   10:28 AM 09/13/2021   10:25 AM 08/12/2021   10:46 AM  PHQ 2/9 Scores  PHQ - 2 Score 0 0 1 0 0 0  PHQ- 9 Score   4   0    Fall Risk    09/16/2022   10:44 AM 09/15/2022   12:05 PM 04/29/2022   10:44 AM 02/27/2022   11:24 AM 09/13/2021   10:27 AM  Fall Risk   Falls in the past year? 0 0 0 0 0  Number falls in past yr: 0 0 0 0 0  Injury with Fall? 0 0 0 0 0  Risk for fall due to : No Fall Risks  No Fall Risks No Fall Risks   Follow up Falls evaluation completed  Falls evaluation completed  Falls evaluation completed  Comment     uses cane    FALL RISK PREVENTION PERTAINING TO THE HOME:  Any stairs in or around the home? Yes  If so, are there any without handrails? No  Home free of loose throw rugs in walkways, pet beds, electrical cords, etc? Yes  Adequate lighting in your home to reduce risk of falls? Yes   ASSISTIVE DEVICES UTILIZED TO PREVENT FALLS:  Life alert? No  Use of a cane, walker or w/c? No  Grab bars in the bathroom? Yes  Shower chair or bench in shower? No  Elevated toilet seat or a handicapped toilet? No   TIMED UP AND GO:  Was the test performed? No . Phone Visit   Cognitive Function:        09/16/2022   10:46 AM  6CIT Screen  What Year? 0  points  What month? 0 points  What time? 0 points  Count back from 20 0 points  Months in reverse 0 points   Repeat phrase 0 points  Total Score 0 points    Immunizations Immunization History  Administered Date(s) Administered   Influenza, High Dose Seasonal PF 07/06/2016, 07/06/2016, 11/30/2017, 09/27/2019   Moderna SARS-COV2 Booster Vaccination 10/05/2020   Moderna Sars-Covid-2 Vaccination 12/10/2019, 01/07/2020   Pneumococcal Conjugate-13 03/05/2018   Pneumococcal Polysaccharide-23 12/15/2011   Tdap 12/04/2010, 07/08/2016    TDAP status: Up to date  Flu Vaccine status: Due, Education has been provided regarding the importance of this vaccine. Advised may receive this vaccine at local pharmacy or Health Dept. Aware to provide a copy of the vaccination record if obtained from local pharmacy or Health Dept. Verbalized acceptance and understanding.  Pneumococcal vaccine status: Up to date  Covid-19 vaccine status: Completed vaccines  Qualifies for Shingles Vaccine? Yes   Zostavax completed Yes   Shingrix Completed?: No.    Education has been provided regarding the importance of this vaccine. Patient has been advised to call insurance company to determine out of pocket expense if they have not yet received this vaccine. Advised may also receive vaccine at local pharmacy or Health Dept. Verbalized acceptance and understanding.  Screening Tests Health Maintenance  Topic Date Due   Zoster Vaccines- Shingrix (1 of 2) Never done   COVID-19 Vaccine (3 - Moderna series) 11/30/2020   Medicare Annual Wellness (AWV)  09/17/2023   TETANUS/TDAP  07/08/2026   Pneumonia Vaccine 965+ Years old  Completed   Hepatitis C Screening  Completed   HPV VACCINES  Aged Out   INFLUENZA VACCINE  Discontinued    Health Maintenance  Health Maintenance Due  Topic Date Due   Zoster Vaccines- Shingrix (1 of 2) Never done   COVID-19 Vaccine (3 - Moderna series) 11/30/2020    Colorectal cancer screening: No longer required.   Lung Cancer Screening: (Low Dose CT Chest recommended if Age 15-80 years, 30  pack-year currently smoking OR have quit w/in 15years.) does not qualify.   Lung Cancer Screening Referral: no  Additional Screening:  Hepatitis C Screening: does qualify; Completed 07/21/2017  Vision Screening: Recommended annual ophthalmology exams for early detection of glaucoma and other disorders of the eye. Is the patient up to date with their annual eye exam?  Yes  Who is the provider or what is the name of the office in which the patient attends annual eye exams? Janet BerlinMichael Tanner, MD and Stephannie LiJason Sanders, MD. If pt is not established with a provider, would they like to be referred to a provider to establish care? No .   Dental Screening: Recommended annual dental exams for proper oral hygiene  Community Resource Referral / Chronic Care Management: CRR required this visit?  No   CCM required this visit?  No      Plan:     I have personally reviewed and noted the following in the patient's chart:   Medical and social history Use of alcohol, tobacco or illicit drugs  Current medications and supplements including opioid prescriptions. Patient is not currently taking opioid prescriptions. Functional ability and status Nutritional status Physical activity Advanced directives List of other physicians Hospitalizations, surgeries, and ER visits in previous 12 months Vitals Screenings to include cognitive, depression, and falls Referrals and appointments  In addition, I have reviewed and discussed with patient certain preventive protocols, quality metrics, and best practice recommendations. A written personalized care plan for preventive  services as well as general preventive health recommendations were provided to patient.     Mickeal Needy, LPN   [id number]   Nurse Notes: N/A  Medical screening examination/treatment/procedure(s) were performed by non-physician practitioner and as supervising physician I was immediately available for consultation/collaboration.  I agree  with above. Jacinta Shoe, MD

## 2022-09-16 NOTE — Patient Instructions (Signed)
Dustin Clayton , Thank you for taking time to come for your Medicare Wellness Visit. I appreciate your ongoing commitment to your health goals. Please review the following plan we discussed and let me know if I can assist you in the future.   These are the goals we discussed:  Goals      To maintain my current health status by continuing to eat healthy, stay physically active and socially active.        This is a list of the screening recommended for you and due dates:  Health Maintenance  Topic Date Due   Zoster (Shingles) Vaccine (1 of 2) Never done   COVID-19 Vaccine (3 - Moderna series) 11/30/2020   Medicare Annual Wellness Visit  09/17/2023   Tetanus Vaccine  07/08/2026   Pneumonia Vaccine  Completed   Hepatitis C Screening: USPSTF Recommendation to screen - Ages 18-79 yo.  Completed   HPV Vaccine  Aged Out   Flu Shot  Discontinued    Advanced directives: No  Conditions/risks identified: Yes  Next appointment: Follow up in one year for your annual wellness visit.   Preventive Care 73 Years and Older, Male  Preventive care refers to lifestyle choices and visits with your health care provider that can promote health and wellness. What does preventive care include? A yearly physical exam. This is also called an annual well check. Dental exams once or twice a year. Routine eye exams. Ask your health care provider how often you should have your eyes checked. Personal lifestyle choices, including: Daily care of your teeth and gums. Regular physical activity. Eating a healthy diet. Avoiding tobacco and drug use. Limiting alcohol use. Practicing safe sex. Taking low doses of aspirin every day. Taking vitamin and mineral supplements as recommended by your health care provider. What happens during an annual well check? The services and screenings done by your health care provider during your annual well check will depend on your age, overall health, lifestyle risk factors, and  family history of disease. Counseling  Your health care provider may ask you questions about your: Alcohol use. Tobacco use. Drug use. Emotional well-being. Home and relationship well-being. Sexual activity. Eating habits. History of falls. Memory and ability to understand (cognition). Work and work Astronomer. Screening  You may have the following tests or measurements: Height, weight, and BMI. Blood pressure. Lipid and cholesterol levels. These may be checked every 5 years, or more frequently if you are over 39 years old. Skin check. Lung cancer screening. You may have this screening every year starting at age 68 if you have a 30-pack-year history of smoking and currently smoke or have quit within the past 15 years. Fecal occult blood test (FOBT) of the stool. You may have this test every year starting at age 102. Flexible sigmoidoscopy or colonoscopy. You may have a sigmoidoscopy every 5 years or a colonoscopy every 10 years starting at age 61. Prostate cancer screening. Recommendations will vary depending on your family history and other risks. Hepatitis C blood test. Hepatitis B blood test. Sexually transmitted disease (STD) testing. Diabetes screening. This is done by checking your blood sugar (glucose) after you have not eaten for a while (fasting). You may have this done every 1-3 years. Abdominal aortic aneurysm (AAA) screening. You may need this if you are a current or former smoker. Osteoporosis. You may be screened starting at age 76 if you are at high risk. Talk with your health care provider about your test results, treatment options,  and if necessary, the need for more tests. Vaccines  Your health care provider may recommend certain vaccines, such as: Influenza vaccine. This is recommended every year. Tetanus, diphtheria, and acellular pertussis (Tdap, Td) vaccine. You may need a Td booster every 10 years. Zoster vaccine. You may need this after age 46. Pneumococcal  13-valent conjugate (PCV13) vaccine. One dose is recommended after age 60. Pneumococcal polysaccharide (PPSV23) vaccine. One dose is recommended after age 26. Talk to your health care provider about which screenings and vaccines you need and how often you need them. This information is not intended to replace advice given to you by your health care provider. Make sure you discuss any questions you have with your health care provider. Document Released: 11/16/2015 Document Revised: 07/09/2016 Document Reviewed: 08/21/2015 Elsevier Interactive Patient Education  2017 [place] Prevention in the Home Falls can cause injuries. They can happen to people of all ages. There are many things you can do to make your home safe and to help prevent falls. What can I do on the outside of my home? Regularly fix the edges of walkways and driveways and fix any cracks. Remove anything that might make you trip as you walk through a door, such as a raised step or threshold. Trim any bushes or trees on the path to your home. Use bright outdoor lighting. Clear any walking paths of anything that might make someone trip, such as rocks or tools. Regularly check to see if handrails are loose or broken. Make sure that both sides of any steps have handrails. Any raised decks and porches should have guardrails on the edges. Have any leaves, snow, or ice cleared regularly. Use sand or salt on walking paths during winter. Clean up any spills in your garage right away. This includes oil or grease spills. What can I do in the bathroom? Use night lights. Install grab bars by the toilet and in the tub and shower. Do not use towel bars as grab bars. Use non-skid mats or decals in the tub or shower. If you need to sit down in the shower, use a plastic, non-slip stool. Keep the floor dry. Clean up any water that spills on the floor as soon as it happens. Remove soap buildup in the tub or shower regularly. Attach  bath mats securely with double-sided non-slip rug tape. Do not have throw rugs and other things on the floor that can make you trip. What can I do in the bedroom? Use night lights. Make sure that you have a light by your bed that is easy to reach. Do not use any sheets or blankets that are too big for your bed. They should not hang down onto the floor. Have a firm chair that has side arms. You can use this for support while you get dressed. Do not have throw rugs and other things on the floor that can make you trip. What can I do in the kitchen? Clean up any spills right away. Avoid walking on wet floors. Keep items that you use a lot in easy-to-reach places. If you need to reach something above you, use a strong step stool that has a grab bar. Keep electrical cords out of the way. Do not use floor polish or wax that makes floors slippery. If you must use wax, use non-skid floor wax. Do not have throw rugs and other things on the floor that can make you trip. What can I do with my stairs? Do not leave  any items on the stairs. Make sure that there are handrails on both sides of the stairs and use them. Fix handrails that are broken or loose. Make sure that handrails are as long as the stairways. Check any carpeting to make sure that it is firmly attached to the stairs. Fix any carpet that is loose or worn. Avoid having throw rugs at the top or bottom of the stairs. If you do have throw rugs, attach them to the floor with carpet tape. Make sure that you have a light switch at the top of the stairs and the bottom of the stairs. If you do not have them, ask someone to add them for you. What else can I do to help prevent falls? Wear shoes that: Do not have high heels. Have rubber bottoms. Are comfortable and fit you well. Are closed at the toe. Do not wear sandals. If you use a stepladder: Make sure that it is fully opened. Do not climb a closed stepladder. Make sure that both sides of the  stepladder are locked into place. Ask someone to hold it for you, if possible. Clearly mark and make sure that you can see: Any grab bars or handrails. First and last steps. Where the edge of each step is. Use tools that help you move around (mobility aids) if they are needed. These include: Canes. Walkers. Scooters. Crutches. Turn on the lights when you go into a dark area. Replace any light bulbs as soon as they burn out. Set up your furniture so you have a clear path. Avoid moving your furniture around. If any of your floors are uneven, fix them. If there are any pets around you, be aware of where they are. Review your medicines with your doctor. Some medicines can make you feel dizzy. This can increase your chance of falling. Ask your doctor what other things that you can do to help prevent falls. This information is not intended to replace advice given to you by your health care provider. Make sure you discuss any questions you have with your health care provider. Document Released: 08/16/2009 Document Revised: 03/27/2016 Document Reviewed: 11/24/2014 Elsevier Interactive Patient Education  2017 [employer].

## 2022-10-21 ENCOUNTER — Ambulatory Visit: Payer: BC Managed Care – PPO | Admitting: Internal Medicine

## 2022-10-22 ENCOUNTER — Encounter: Payer: Self-pay | Admitting: Internal Medicine

## 2022-10-22 ENCOUNTER — Ambulatory Visit (INDEPENDENT_AMBULATORY_CARE_PROVIDER_SITE_OTHER): Payer: Medicare Other | Admitting: Internal Medicine

## 2022-10-22 VITALS — BP 118/60 | HR 95 | Temp 98.2°F | Ht 71.0 in | Wt 226.0 lb

## 2022-10-22 DIAGNOSIS — I10 Essential (primary) hypertension: Secondary | ICD-10-CM | POA: Diagnosis not present

## 2022-10-22 DIAGNOSIS — I25118 Atherosclerotic heart disease of native coronary artery with other forms of angina pectoris: Secondary | ICD-10-CM

## 2022-10-22 DIAGNOSIS — Z23 Encounter for immunization: Secondary | ICD-10-CM | POA: Diagnosis not present

## 2022-10-22 DIAGNOSIS — R739 Hyperglycemia, unspecified: Secondary | ICD-10-CM

## 2022-10-22 MED ORDER — ATORVASTATIN CALCIUM 20 MG PO TABS: 20.0000 mg | ORAL_TABLET | Freq: Every day | ORAL | 3 refills | Status: DC

## 2022-10-22 MED ORDER — PREDNISONE 1 MG PO TABS: ORAL_TABLET | ORAL | 1 refills | Status: DC

## 2022-10-22 MED ORDER — AMLODIPINE BESYLATE 2.5 MG PO TABS: 2.5000 mg | ORAL_TABLET | Freq: Every day | ORAL | 3 refills | Status: DC

## 2022-10-22 NOTE — Addendum Note (Signed)
Addended by: Racheal Patches on: 10/22/2022 03:28 PM   Modules accepted: Orders

## 2022-10-22 NOTE — Progress Notes (Signed)
Subjective:  Patient ID: Dustin Clayton, male    DOB: 1946-03-07  Age: 76 y.o. MRN: [id number]  CC: Follow-up   HPI Dustin Clayton presents for PMR, dyslipidemia, HTN C/o low BP  Outpatient Medications Prior to Visit  Medication Sig Dispense Refill   Cholecalciferol (VITAMIN D3 PO) Take 1 capsule by mouth daily.     Coenzyme Q10 (CO Q 10 PO) Take 1 capsule by mouth daily.     cyclobenzaprine (FLEXERIL) 5 MG tablet Take 1 tablet (5 mg total) by mouth 3 (three) times daily as needed for muscle spasms. 30 tablet 0   dorzolamide-timolol (COSOPT) 22.3-6.8 MG/ML ophthalmic solution Place 1 drop into the left eye 2 (two) times daily.     metoprolol tartrate (LOPRESSOR) 25 MG tablet Take 1 tablet (25 mg total) by mouth daily. 90 tablet 3   amLODipine (NORVASC) 2.5 MG tablet Take 2.5 mg by mouth daily. Take 2.5 mg by mouth daily.     atorvastatin (LIPITOR) 10 MG tablet Take 1 tablet (10 mg total) by mouth daily. 90 tablet 3   predniSONE (DELTASONE) 1 MG tablet Taper down Prednisone monthly - 4 mg/d - 3mg /d - 2mg /d - 1 mg/d - then stop. Take pc 120 tablet 3   No facility-administered medications prior to visit.    ROS: Review of Systems  Constitutional:  Negative for appetite change, fatigue and unexpected weight change.  HENT:  Negative for congestion, nosebleeds, sneezing, sore throat and trouble swallowing.   Eyes:  Negative for itching and visual disturbance.  Respiratory:  Negative for cough.   Cardiovascular:  Negative for chest pain, palpitations and leg swelling.  Gastrointestinal:  Negative for abdominal distention, blood in stool, diarrhea and nausea.  Genitourinary:  Negative for frequency and hematuria.  Musculoskeletal:  Negative for back pain, gait problem, joint swelling and neck pain.  Skin:  Negative for rash.  Neurological:  Negative for dizziness, tremors, speech difficulty and weakness.  Psychiatric/Behavioral:  Negative for agitation, dysphoric mood, sleep  disturbance and suicidal ideas. The patient is not nervous/anxious.     Objective:  BP 118/60 (BP Location: Left Arm, Patient Position: Sitting, Cuff Size: Normal)   Pulse 95   Temp 98.2 F (36.8 C) (Oral)   Ht 5\' 11"  (1.803 m)   Wt 226 lb (102.5 kg)   SpO2 98%   BMI 31.52 kg/m   BP Readings from Last 3 Encounters:  10/22/22 118/60  07/30/22 122/70  04/29/22 120/78    Wt Readings from Last 3 Encounters:  10/22/22 226 lb (102.5 kg)  09/16/22 225 lb (102.1 kg)  07/30/22 225 lb 3.2 oz (102.2 kg)    Physical Exam Constitutional:      General: He is not in acute distress.    Appearance: He is well-developed.     Comments: NAD  Eyes:     Conjunctiva/sclera: Conjunctivae normal.     Pupils: Pupils are equal, round, and reactive to light.  Neck:     Thyroid: No thyromegaly.     Vascular: No JVD.  Cardiovascular:     Rate and Rhythm: Normal rate and regular rhythm.     Heart sounds: Normal heart sounds. No murmur heard.    No friction rub. No gallop.  Pulmonary:     Effort: Pulmonary effort is normal. No respiratory distress.     Breath sounds: Normal breath sounds. No wheezing or rales.  Chest:     Chest wall: No tenderness.  Abdominal:     General: Bowel sounds  are normal. There is no distension.     Palpations: Abdomen is soft. There is no mass.     Tenderness: There is no abdominal tenderness. There is no guarding or rebound.  Musculoskeletal:        General: No tenderness. Normal range of motion.     Cervical back: Normal range of motion.  Lymphadenopathy:     Cervical: No cervical adenopathy.  Skin:    General: Skin is warm and dry.     Findings: No rash.  Neurological:     Mental Status: He is alert and oriented to person, place, and time.     Cranial Nerves: No cranial nerve deficit.     Motor: No abnormal muscle tone.     Coordination: Coordination normal.     Gait: Gait normal.     Deep Tendon Reflexes: Reflexes are normal and symmetric.  Psychiatric:         Behavior: Behavior normal.        Thought Content: Thought content normal.        Judgment: Judgment normal.   L wrist w/swelling  Lab Results  Component Value Date   WBC 6.4 04/29/2022   HGB 15.1 04/29/2022   HCT 46.0 04/29/2022   PLT 176.0 04/29/2022   GLUCOSE 101 (H) 07/30/2022   CHOL 180 07/30/2022   TRIG 141.0 07/30/2022   HDL 85.80 07/30/2022   LDLCALC 66 07/30/2022   ALT 16 07/30/2022   AST 15 07/30/2022   NA 137 07/30/2022   K 3.9 07/30/2022   CL 102 07/30/2022   CREATININE 0.90 07/30/2022   BUN 12 07/30/2022   CO2 27 07/30/2022   TSH 4.73 08/12/2021   PSA 1.01 05/08/2022   INR 1.00 07/20/2017   HGBA1C 5.4 07/30/2022    ECHOCARDIOGRAM COMPLETE  Result Date: 07/21/2017                            *[facility]*                         [address], [place] [address]                            [id number] ------------------------------------------------------------------- Transthoracic Echocardiography Patient:    Honor, Bertino MR #:       [id number] Study Date: 07/21/2017 Gender:     M Age:        6 Height:     180.3 cm Weight:     101.7 kg BSA:        2.28 m^2 Pt. 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 ------------------------------------------------------------------- History:   PMH:   Myocardial infarction.  Risk factors: Hypertension. ------------------------------------------------------------------- Study Conclusions - Left ventricle: The cavity  size was normal. There was moderate    concentric hypertrophy. Systolic function was normal. The   estimated ejection fraction was in the range of 55% to 60%. Wall   motion was normal; there were no regional wall motion   abnormalities. Doppler parameters are consistent with abnormal   left ventricular relaxation (grade 1 diastolic dysfunction). - Left atrium: The atrium was mildly dilated. - Right ventricle: The cavity size was mildly dilated. - Atrial septum: No defect or patent foramen ovale was identified. ------------------------------------------------------------------- Study data:  No prior study was available for comparison.  Study status:  Routine.  Procedure:  Transthoracic echocardiography. Image quality was adequate.  Study completion:  There were no complications.          Transthoracic echocardiography.  M-mode, complete 2D, spectral Doppler, and color Doppler.  Birthdate: Patient birthdate: [birth year].  Age:  Patient is 76 yr old.  Sex: Gender: male.    BMI: 31.3 kg/m^2.  Blood pressure:     127/69 Patient status:  Inpatient.  Study date:  Study date: 07/21/2017. Study time: 10:52 AM.  Location:  Bedside. ------------------------------------------------------------------- ------------------------------------------------------------------- Left ventricle:  The cavity size was normal. There was moderate concentric hypertrophy. Systolic function was normal. The estimated ejection fraction was in the range of 55% to 60%. Wall motion was normal; there were no regional wall motion abnormalities. Doppler parameters are consistent with abnormal left ventricular relaxation (grade 1 diastolic dysfunction). There was no evidence of elevated ventricular filling pressure by Doppler parameters. ------------------------------------------------------------------- Aortic valve:   Structurally normal valve. Trileaflet. Cusp separation was normal.  Doppler:  Transvalvular velocity was within the normal range. There was no stenosis. There was no  regurgitation. ------------------------------------------------------------------- Aorta:  Aortic root: The aortic root was normal in size. Ascending aorta: The ascending aorta was normal in size. ------------------------------------------------------------------- Mitral valve:   Structurally normal valve.   Leaflet separation was normal.  Doppler:  Transvalvular velocity was within the normal range. There was no evidence for stenosis. There was no regurgitation. ------------------------------------------------------------------- Left atrium:  The atrium was mildly dilated. ------------------------------------------------------------------- Atrial septum:  No defect or patent foramen ovale was identified.  ------------------------------------------------------------------- Right ventricle:  The cavity size was mildly dilated. Systolic function was normal. ------------------------------------------------------------------- Pulmonic valve:    Structurally normal valve.   Cusp separation was normal.  Doppler:  Transvalvular velocity was within the normal range. There was trivial regurgitation. ------------------------------------------------------------------- Tricuspid valve:   Structurally normal valve.   Leaflet separation was normal.  Doppler:  Transvalvular velocity was within the normal range. There was trivial regurgitation. ------------------------------------------------------------------- Pulmonary artery:   Systolic pressure was within the normal range.  ------------------------------------------------------------------- Right atrium:  The atrium was normal in size. ------------------------------------------------------------------- Pericardium:  There was no pericardial effusion. ------------------------------------------------------------------- Systemic veins: Inferior vena cava: The vessel was normal in size. The respirophasic diameter changes were in the normal range (= 50%), consistent with normal  central venous pressure. ------------------------------------------------------------------- Measurements  Left ventricle                          Value        Reference  LV ID, ED, PLAX chordal                 52    mm     43 - 52  LV ID, ES, PLAX chordal                 30    mm  23 - 38  LV fx shortening, PLAX chordal          42    %      >=29  LV PW thickness, ED                     15    mm     ----------  IVS/LV PW ratio, ED                     1            <=1.3  Stroke volume, 2D                       74    ml     ----------  Stroke volume/bsa, 2D                   32    ml/m^2 ----------  LV e&', lateral                          6.53  cm/s   ----------  LV e&', medial                           4.9   cm/s   ----------  LV e&', average                          5.72  cm/s   ----------   Ventricular septum                      Value        Reference  IVS thickness, ED                       15    mm     ----------   LVOT                                    Value        Reference  LVOT ID, S                              20    mm     ----------  LVOT area                               3.14  cm^2   ----------  LVOT peak velocity, S                   121   cm/s   ----------  LVOT mean velocity, S                   80.7  cm/s   ----------  LVOT VTI, S                             23.6  cm     ----------  LVOT peak gradient, S                   6     mm  Hg  ----------   Aorta                                   Value        Reference  Aortic root ID, ED                      34    mm     ----------   Left atrium                             Value        Reference  LA ID, A-P, ES                          40    mm     ----------  LA ID/bsa, A-P                          1.75  cm/m^2 <=2.2  LA volume, S                            60    ml     ----------  LA volume/bsa, S                        26.3  ml/m^2 ----------  LA volume, ES, 1-p A4C                  65.2  ml     ----------  LA volume/bsa, ES, 1-p A4C               28.5  ml/m^2 ----------  LA volume, ES, 1-p A2C                  48.9  ml     ----------  LA volume/bsa, ES, 1-p A2C              21.4  ml/m^2 ----------   Mitral valve                            Value        Reference  Mitral deceleration time        (H)     289   ms     150 - 230  Mitral E/A ratio, peak                  0.7          ----------   Pulmonary arteries                      Value        Reference  PA pressure, S, DP                      27    mm Hg  <=30   Tricuspid valve                         Value        Reference  Tricuspid regurg peak velocity          247  cm/s   ----------  Tricuspid peak RV-RA gradient           24    mm Hg  ----------   Right atrium                            Value        Reference  RA ID, S-I, ES, A4C             (H)     60.3  mm     34 - 49  RA area, ES, A4C                (H)     21.6  cm^2   8.3 - 19.5  RA volume, ES, A/L                      65.2  ml     ----------  RA volume/bsa, ES, A/L                  28.5  ml/m^2 ----------   Systemic veins                          Value        Reference  Estimated CVP                           3     mm Hg  ----------   Right ventricle                         Value        Reference  RV ID, minor axis, ED, A4C              42.95 mm     26 - 43  RV ID, minor axis, ED, A4C base         56    mm     ----------  TAPSE                                   23.1  mm     ----------  RV pressure, S, DP                      27    mm Hg  <=30  RV s&', lateral, S                       15.9  cm/s   ---------- Legend: (L)  and  (H)  mark values outside specified reference range. ------------------------------------------------------------------- Prepared and Electronically Authenticated by Sanda Klein, MD 2018-09-18T13:09:48  US Abdomen Complete  Result Date: 07/21/2017 CLINICAL DATA:  Elevated liver function tests. EXAM: ABDOMEN ULTRASOUND COMPLETE COMPARISON:  None. FINDINGS: Gallbladder: Multiple gallstones are noted with the largest  measuring 1.3 cm. No significant gallbladder wall thickening or pericholecystic fluid is noted. Sludge is noted as well. No sonographic Murphy's sign is noted. Common bile duct: Diameter: 5.9 mm which is within normal limits. Liver: 1.1 cm simple cyst is noted in left hepatic lobe. Mildly increased echogenicity of hepatic parenchyma is noted suggesting fatty infiltration or hepatocellular disease. Portal vein is patent on color Doppler imaging with normal direction of blood  flow towards the liver. IVC: No abnormality visualized. Pancreas: Visualized portion unremarkable. Spleen: Size and appearance within normal limits. Right Kidney: Length: 12.3 cm. Echogenicity within normal limits. No mass or hydronephrosis visualized. Left Kidney: Length: 13.1 cm. Echogenicity within normal limits. No mass or hydronephrosis visualized. Abdominal aorta: No aneurysm visualized. Other findings: None. IMPRESSION: Cholelithiasis without definite evidence of cholecystitis. Mildly increased echogenicity of hepatic parenchyma is noted suggesting fatty infiltration or diffuse hepatocellular disease. Small left hepatic cyst is noted. Electronically Signed   By: Marijo Conception, M.D.   On: 07/21/2017 09:54   CT ABDOMEN PELVIS W CONTRAST  Result Date: 07/21/2017 CLINICAL DATA:  Diffuse abdominal pain and distention for several days. Elevated liver function tests. Fever. EXAM: CT ABDOMEN AND PELVIS WITH CONTRAST TECHNIQUE: Multidetector CT imaging of the abdomen and pelvis was performed using the standard protocol following bolus administration of intravenous contrast. CONTRAST:  110mL ISOVUE-300 IOPAMIDOL (ISOVUE-300) INJECTION 61% COMPARISON:  07/21/2017 abdominal sonogram. FINDINGS: Lower chest: Mild patchy tree-in-bud opacities in the dependent right lung base. Subsegmental atelectasis in the dependent lung bases. Hepatobiliary: Normal liver size. A few simple lateral segment left liver lobe cysts, largest 2.0 cm. Several  subcentimeter hypodense lesions scattered throughout the liver, too small to characterize, for which no follow-up is required unless the patient has risk factors for liver malignancy. Nondistended gallbladder contains multiple calcified gallstones measuring up to 1.8 cm. No definite gallbladder wall thickening or pericholecystic fluid. No significant intrahepatic biliary ductal dilatation. Common bile duct diameter 6 mm, top-normal. There is a 4 mm calcified stone in the lower third of the common bile duct near the ampulla. There is mild haziness of the fat surrounding the common bile duct. Pancreas: Normal, with no mass or duct dilation. Spleen: Normal size. No mass. Adrenals/Urinary Tract: Normal adrenals. Normal kidneys with no hydronephrosis and no renal mass. Normal bladder. Stomach/Bowel: Small hiatal hernia. Otherwise nondistended and grossly normal stomach. Normal caliber small bowel with no small bowel wall thickening. Appendectomy. Moderate diffuse colonic diverticulosis, most prominent in the sigmoid colon, with no large bowel wall thickening or pericolonic fat stranding. 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 Patent portal, splenic, hepatic and renal veins. No pathologically enlarged lymph nodes in the abdomen or pelvis. Reproductive: Normal size prostate with nonspecific internal prostatic calcifications. Other: No pneumoperitoneum, ascites or focal fluid collection. Musculoskeletal: No aggressive appearing focal osseous lesions. Moderate thoracolumbar spondylosis, most prominent at L5-S1. IMPRESSION: 1. Cholelithiasis.  No CT findings of acute cholecystitis. 2. Solitary 4 mm choledocholith in the lower third of the common bile duct near the ampulla. CBD diameter 6 mm, top-normal. No intrahepatic biliary ductal dilatation. 3. Haziness of the fat surrounding the common bile duct, cannot exclude ascending cholangitis. 4. Mild patchy  tree-in-bud opacities at the dependent right lung base, compatible with mild aspiration or infectious bronchiolitis. 5. 3.1 cm Abdominal Aortic Aneurysm (ICD10-I71.9). Recommend follow-up aortic ultrasound in 3 years. This recommendation follows ACR consensus guidelines: White Paper of the ACR Incidental Findings Committee II on Vascular Findings. J Am Coll Radiol 2013; 10:789-794. 6.  Aortic Atherosclerosis (ICD10-I70.0). 7. Small hiatal hernia. 8. Moderate diffuse colonic diverticulosis. Electronically Signed   By: Ilona Sorrel M.D.   On: 07/21/2017 09:48   DG CHEST PORT 1 VIEW  Result Date: 07/21/2017 CLINICAL DATA:  Fevers EXAM: PORTABLE CHEST 1 VIEW COMPARISON:  01/06/2013 FINDINGS: The heart size and mediastinal contours are within normal limits. Both lungs are clear. The visualized skeletal structures are  unremarkable. IMPRESSION: No active disease. Electronically Signed   By: Inez Catalina M.D.   On: 07/21/2017 07:47   CARDIAC CATHETERIZATION  Result Date: 07/20/2017  Mid Cx to Dist Cx lesion, 100 %stenosed.  Mid RCA lesion, 20 %stenosed.  Prox Cx lesion, 20 %stenosed.  Ost 1st Diag to 1st Diag lesion, 60 %stenosed.  Prox LAD lesion, 20 %stenosed.  The left ventricular systolic function is normal.  LV end diastolic pressure is normal.  The left ventricular ejection fraction is greater than 65% by visual estimate.  There is no mitral valve regurgitation.  1. There is right to left filling of a small lateral wall branch. This appears to be a total occlusion of the distal Circumflex where the vessel becomes very small in caliber but cannot totally exclude this being a small Diagonal branch. The distal AV groove Circumflex changes quickly to a small caliber vessel with a lateral branch. This could the site of total occlusion. The vascular territory supplied by this branch is very small. 2. Moderate stenosis in the ostium of the large first Diagonal branch with excellent flow down the vessel. This  vessel represents a dual LAD. This lesion does not appear to be ulcerated and doe not appear to be flow limiting. 3. Mild stenosis in the proximal LAD. The mid LAD is tortuous and there is subtle dye staining in this bend with quick dye washout. The LAD continues to the apex. 4. The RCA is a large dominant vessel with mild plaque. The distal right gives off a collateral to a very small lateral wall vessel. 5. Normal LV systolic function 6. 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 As above, LBBB induced with the placement of the pigtail catheter in the LV. Echo in am. It is unclear if his presentation is due to the occlusion of the small lateral wall branch. He is at the completion of the cath not having any symptoms.    Assessment & Plan:   Problem List Items Addressed This Visit     Hyperglycemia   Relevant Orders   Comprehensive metabolic panel   Sedimentation rate   Hemoglobin A1c   Essential hypertension - Primary   Relevant Medications   atorvastatin (LIPITOR) 20 MG tablet   amLODipine (NORVASC) 2.5 MG tablet   Other Relevant Orders   Comprehensive metabolic panel   Sedimentation rate      Meds ordered this encounter  Medications   atorvastatin (LIPITOR) 20 MG tablet    Sig: Take 1 tablet (20 mg total) by mouth daily.    Dispense:  90 tablet    Refill:  3   amLODipine (NORVASC) 2.5 MG tablet    Sig: Take 1 tablet (2.5 mg total) by mouth daily. Take 2.5 mg by mouth daily.    Dispense:  90 tablet    Refill:  3   predniSONE (DELTASONE) 1 MG tablet    Sig: Taper down Prednisone monthly - 3mg /d - 2mg /d - 1 mg/d as directed    Dispense:  120 tablet    Refill:  1      Follow-up: Return in about 3 months (around 01/21/2023) for a follow-up visit.  Walker Kehr, MD

## 2022-10-22 NOTE — Patient Instructions (Signed)
Blue-Emu cream -- use 2-3 times a day ? ?

## 2022-11-04 ENCOUNTER — Other Ambulatory Visit: Payer: Self-pay | Admitting: Internal Medicine

## 2022-12-03 ENCOUNTER — Other Ambulatory Visit: Payer: Self-pay | Admitting: Internal Medicine

## 2022-12-09 ENCOUNTER — Other Ambulatory Visit: Payer: Self-pay | Admitting: Cardiology

## 2023-01-21 ENCOUNTER — Telehealth: Payer: Self-pay | Admitting: Internal Medicine

## 2023-01-21 ENCOUNTER — Encounter: Payer: Self-pay | Admitting: Internal Medicine

## 2023-01-21 ENCOUNTER — Ambulatory Visit: Payer: Medicare Other | Admitting: Internal Medicine

## 2023-01-21 VITALS — BP 120/82 | HR 42 | Temp 97.8°F | Ht 71.0 in | Wt 225.0 lb

## 2023-01-21 DIAGNOSIS — R932 Abnormal findings on diagnostic imaging of liver and biliary tract: Secondary | ICD-10-CM | POA: Insufficient documentation

## 2023-01-21 DIAGNOSIS — E785 Hyperlipidemia, unspecified: Secondary | ICD-10-CM

## 2023-01-21 DIAGNOSIS — M353 Polymyalgia rheumatica: Secondary | ICD-10-CM

## 2023-01-21 DIAGNOSIS — I10 Essential (primary) hypertension: Secondary | ICD-10-CM | POA: Diagnosis not present

## 2023-01-21 DIAGNOSIS — R739 Hyperglycemia, unspecified: Secondary | ICD-10-CM | POA: Diagnosis not present

## 2023-01-21 DIAGNOSIS — M2560 Stiffness of unspecified joint, not elsewhere classified: Secondary | ICD-10-CM | POA: Diagnosis not present

## 2023-01-21 DIAGNOSIS — I480 Paroxysmal atrial fibrillation: Secondary | ICD-10-CM

## 2023-01-21 LAB — COMPREHENSIVE METABOLIC PANEL
ALT: 20 U/L (ref 0–53)
AST: 19 U/L (ref 0–37)
Albumin: 4.1 g/dL (ref 3.5–5.2)
Alkaline Phosphatase: 67 U/L (ref 39–117)
BUN: 13 mg/dL (ref 6–23)
CO2: 29 mEq/L (ref 19–32)
Calcium: 9.4 mg/dL (ref 8.4–10.5)
Chloride: 102 mEq/L (ref 96–112)
Creatinine, Ser: 0.85 mg/dL (ref 0.40–1.50)
GFR: 83.01 mL/min (ref >60.00)
Glucose, Bld: 110 mg/dL — ABNORMAL HIGH (ref 70–99)
Potassium: 4 mEq/L (ref 3.5–5.1)
Sodium: 139 mEq/L (ref 135–145)
Total Bilirubin: 0.9 mg/dL (ref 0.2–1.2)
Total Protein: 6.8 g/dL (ref 6.0–8.3)

## 2023-01-21 LAB — SEDIMENTATION RATE: Sed Rate: 7 mm/hr (ref 0–20)

## 2023-01-21 LAB — HEMOGLOBIN A1C: Hgb A1c MFr Bld: 5.6 % (ref 4.6–6.5)

## 2023-01-21 MED ORDER — PREDNISONE 1 MG PO TABS: ORAL_TABLET | ORAL | 5 refills | Status: DC

## 2023-01-21 MED ORDER — B COMPLEX-C PO TABS: 1.0000 | ORAL_TABLET | Freq: Every day | ORAL | 3 refills | Status: DC

## 2023-01-21 MED ORDER — VITAMIN D3 50 MCG (2000 UT) PO CAPS: 2000.0000 [IU] | ORAL_CAPSULE | Freq: Every day | ORAL | 3 refills | Status: DC

## 2023-01-21 NOTE — Assessment & Plan Note (Signed)
Occasional stiffness On Deltasone 2.5 mg/d now Start Deltasone 3 mg qod and 2 mg qod Start B complex, Vit D

## 2023-01-21 NOTE — Telephone Encounter (Signed)
[facility] called for clarification on patient's new  predniSONE (DELTASONE) 1 MG tablet prescription. Please give him a callback at [phone] and ask for [place].

## 2023-01-21 NOTE — Telephone Encounter (Signed)
Called [facility] spoke w/ Myeisha. Gave MD response on the Predisone.Marland KitchenJohny Chess

## 2023-01-21 NOTE — Progress Notes (Signed)
Subjective:  Patient ID: Dustin Clayton, male    DOB: January 27, 1946  Age: 77 y.o. MRN: [id number]  CC: Follow-up (3 mnth f/u)   HPI Dustin Clayton presents for PMR, OA, dyslipidemia He has to continue on low-dose prednisone-2.5 mg a day due to stiffness, knee pain He is under stress with his wife's memory loss.  He has to cook, clean etc.     Outpatient Medications Prior to Visit  Medication Sig Dispense Refill   amLODipine (NORVASC) 2.5 MG tablet Take 1 tablet (2.5 mg total) by mouth daily. Take 2.5 mg by mouth daily. 90 tablet 3   atorvastatin (LIPITOR) 20 MG tablet Take 1 tablet (20 mg total) by mouth daily. 90 tablet 1   Cholecalciferol (VITAMIN D3 PO) Take 1 capsule by mouth daily.     Coenzyme Q10 (CO Q 10 PO) Take 1 capsule by mouth daily.     cyclobenzaprine (FLEXERIL) 5 MG tablet Take 1 tablet (5 mg total) by mouth 3 (three) times daily as needed for muscle spasms. 30 tablet 0   dorzolamide-timolol (COSOPT) 22.3-6.8 MG/ML ophthalmic solution Place 1 drop into the left eye 2 (two) times daily.     Flaxseed Oil (LINSEED OIL) OIL Take by mouth.     metoprolol tartrate (LOPRESSOR) 25 MG tablet Take 1 tablet (25 mg total) by mouth daily. 90 tablet 3   Multiple Vitamins-Minerals (MULTI FOR HIM) TABS Orally daily     predniSONE (DELTASONE) 1 MG tablet Taper down Prednisone monthly - 3mg /d - 2mg /d - 1 mg/d as directed 120 tablet 1   predniSONE (DELTASONE) 5 MG tablet TAKE 1 TABLET(5 MG) BY MOUTH DAILY WITH BREAKFAST 90 tablet 1   No facility-administered medications prior to visit.    ROS: Review of Systems  Constitutional:  Negative for appetite change, fatigue and unexpected weight change.  HENT:  Negative for congestion, nosebleeds, sneezing, sore throat and trouble swallowing.   Eyes:  Negative for itching and visual disturbance.  Respiratory:  Negative for cough.   Cardiovascular:  Negative for chest pain, palpitations and leg swelling.  Gastrointestinal:  Negative for  abdominal distention, blood in stool, diarrhea and nausea.  Genitourinary:  Negative for frequency and hematuria.  Musculoskeletal:  Negative for back pain, gait problem, joint swelling and neck pain.  Skin:  Negative for rash.  Neurological:  Negative for dizziness, tremors, speech difficulty and weakness.  Psychiatric/Behavioral:  Negative for agitation, dysphoric mood and sleep disturbance. The patient is not nervous/anxious.     Objective:  BP 120/82 (BP Location: Left Arm, Patient Position: Sitting, Cuff Size: Large)   Pulse (!) 42   Temp 97.8 F (36.6 C) (Oral)   Ht 5\' 11"  (1.803 m)   Wt 225 lb (102.1 kg)   SpO2 98%   BMI 31.38 kg/m   BP Readings from Last 3 Encounters:  01/21/23 120/82  10/22/22 118/60  07/30/22 122/70    Wt Readings from Last 3 Encounters:  01/21/23 225 lb (102.1 kg)  10/22/22 226 lb (102.5 kg)  09/16/22 225 lb (102.1 kg)    Physical Exam Constitutional:      General: He is not in acute distress.    Appearance: He is well-developed. He is obese.     Comments: NAD  Eyes:     Conjunctiva/sclera: Conjunctivae normal.     Pupils: Pupils are equal, round, and reactive to light.  Neck:     Thyroid: No thyromegaly.     Vascular: No JVD.  Cardiovascular:  Rate and Rhythm: Normal rate and regular rhythm.     Heart sounds: Murmur heard.     No friction rub. No gallop.  Pulmonary:     Effort: Pulmonary effort is normal. No respiratory distress.     Breath sounds: Normal breath sounds. No wheezing or rales.  Chest:     Chest wall: No tenderness.  Abdominal:     General: Bowel sounds are normal. There is no distension.     Palpations: Abdomen is soft. There is no mass.     Tenderness: There is no abdominal tenderness. There is no guarding or rebound.  Musculoskeletal:        General: No tenderness. Normal range of motion.     Cervical back: Normal range of motion.  Lymphadenopathy:     Cervical: No cervical adenopathy.  Skin:    General:  Skin is warm and dry.     Findings: No rash.  Neurological:     Mental Status: He is alert and oriented to person, place, and time.     Cranial Nerves: No cranial nerve deficit.     Motor: No abnormal muscle tone.     Coordination: Coordination normal.     Gait: Gait normal.     Deep Tendon Reflexes: Reflexes are normal and symmetric.  Psychiatric:        Behavior: Behavior normal.        Thought Content: Thought content normal.        Judgment: Judgment normal.   Somewhat stiff knees and lumbar spine  Lab Results  Component Value Date   WBC 6.4 04/29/2022   HGB 15.1 04/29/2022   HCT 46.0 04/29/2022   PLT 176.0 04/29/2022   GLUCOSE 110 (H) 01/21/2023   CHOL 180 07/30/2022   TRIG 141.0 07/30/2022   HDL 85.80 07/30/2022   LDLCALC 66 07/30/2022   ALT 20 01/21/2023   AST 19 01/21/2023   NA 139 01/21/2023   K 4.0 01/21/2023   CL 102 01/21/2023   CREATININE 0.85 01/21/2023   BUN 13 01/21/2023   CO2 29 01/21/2023   TSH 4.73 08/12/2021   PSA 1.01 05/08/2022   INR 1.00 07/20/2017   HGBA1C 5.6 01/21/2023    ECHOCARDIOGRAM COMPLETE  Result Date: 07/21/2017                            *[facility]*                         [address] [place], [place] [address]                            [id number] ------------------------------------------------------------------- Transthoracic Echocardiography Patient:    Ceth, Mullady MR #:       [id number] Study Date: 07/21/2017 Gender:     M Age:        38 Height:     180.3 cm Weight:     101.7 kg BSA:        2.28 m^2 Pt. 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 ------------------------------------------------------------------- History:   PMH:   Myocardial infarction.  Risk factors: Hypertension. ------------------------------------------------------------------- Study Conclusions - Left ventricle: The cavity size was normal. There was moderate   concentric hypertrophy. Systolic function was normal. The   estimated ejection fraction was in the range of 55% to 60%. Wall   motion was normal; there were no regional wall motion   abnormalities. Doppler parameters are consistent with abnormal   left ventricular relaxation (grade 1 diastolic dysfunction). - Left atrium: The atrium was mildly dilated. - Right ventricle: The cavity size was mildly dilated. - Atrial septum: No defect or patent foramen ovale was identified. ------------------------------------------------------------------- Study data:  No prior study was available for comparison.  Study status:  Routine.  Procedure:  Transthoracic echocardiography. Image quality was adequate.  Study completion:  There were no complications.          Transthoracic echocardiography.  M-mode, complete 2D, spectral Doppler, and color Doppler.  Birthdate: Patient birthdate: [birth year].  Age:  Patient is 77 yr old.  Sex: Gender: male.    BMI: 31.3 kg/m^2.  Blood pressure:     127/69 Patient status:  Inpatient.  Study date:  Study date: 07/21/2017. Study time: 10:52 AM.  Location:  Bedside. ------------------------------------------------------------------- ------------------------------------------------------------------- Left ventricle:  The cavity size was normal. There was moderate concentric hypertrophy. Systolic function was normal. The estimated ejection fraction was in the range of 55% to 60%. Wall motion was normal; there were no regional wall motion abnormalities. Doppler parameters are consistent with abnormal  left ventricular relaxation (grade 1 diastolic dysfunction). There was no evidence of elevated ventricular filling pressure by Doppler parameters. ------------------------------------------------------------------- Aortic valve:   Structurally normal valve. Trileaflet. Cusp separation was normal.  Doppler:  Transvalvular velocity was within the normal range. There was no stenosis. There was no regurgitation. ------------------------------------------------------------------- Aorta:  Aortic root: The aortic root was normal in size. Ascending aorta: The ascending aorta was normal in size. ------------------------------------------------------------------- Mitral valve:   Structurally normal valve.   Leaflet separation was normal.  Doppler:  Transvalvular velocity was within the normal range. There was no evidence for stenosis. There was no regurgitation. ------------------------------------------------------------------- Left atrium:  The atrium was mildly dilated. ------------------------------------------------------------------- Atrial septum:  No defect or patent foramen ovale was identified.  ------------------------------------------------------------------- Right ventricle:  The cavity size was mildly dilated. Systolic function was normal. ------------------------------------------------------------------- Pulmonic valve:    Structurally normal valve.   Cusp separation was normal.  Doppler:  Transvalvular velocity was within the normal range. There was trivial regurgitation. ------------------------------------------------------------------- Tricuspid valve:   Structurally normal valve.   Leaflet separation was normal.  Doppler:  Transvalvular velocity was within the normal range. There was trivial regurgitation. ------------------------------------------------------------------- Pulmonary artery:   Systolic pressure was within the normal range.   ------------------------------------------------------------------- Right atrium:  The atrium was normal in size. ------------------------------------------------------------------- Pericardium:  There was no pericardial effusion. ------------------------------------------------------------------- Systemic veins: Inferior vena cava: The vessel was normal in size. The respirophasic diameter changes were in the normal range (= 50%), consistent with normal central venous pressure. ------------------------------------------------------------------- Measurements  Left ventricle                          Value  Reference  LV ID, ED, PLAX chordal                 52    mm     43 - 52  LV ID, ES, PLAX chordal                 30    mm     23 - 38  LV fx shortening, PLAX chordal          42    %      >=29  LV PW thickness, ED                     15    mm     ----------  IVS/LV PW ratio, ED                     1            <=1.3  Stroke volume, 2D                       74    ml     ----------  Stroke volume/bsa, 2D                   32    ml/m^2 ----------  LV e&', lateral                          6.53  cm/s   ----------  LV e&', medial                           4.9   cm/s   ----------  LV e&', average                          5.72  cm/s   ----------   Ventricular septum                      Value        Reference  IVS thickness, ED                       15    mm     ----------   LVOT                                    Value        Reference  LVOT ID, S                              20    mm     ----------  LVOT area                               3.14  cm^2   ----------  LVOT peak velocity, S                   121   cm/s   ----------  LVOT mean velocity, S                   80.7  cm/s   ----------  LVOT VTI,  S                             23.6  cm     ----------  LVOT peak gradient, S                   6     mm Hg  ----------   Aorta                                   Value        Reference  Aortic root ID, ED                       34    mm     ----------   Left atrium                             Value        Reference  LA ID, A-P, ES                          40    mm     ----------  LA ID/bsa, A-P                          1.75  cm/m^2 <=2.2  LA volume, S                            60    ml     ----------  LA volume/bsa, S                        26.3  ml/m^2 ----------  LA volume, ES, 1-p A4C                  65.2  ml     ----------  LA volume/bsa, ES, 1-p A4C              28.5  ml/m^2 ----------  LA volume, ES, 1-p A2C                  48.9  ml     ----------  LA volume/bsa, ES, 1-p A2C              21.4  ml/m^2 ----------   Mitral valve                            Value        Reference  Mitral deceleration time        (H)     289   ms     150 - 230  Mitral E/A ratio, peak                  0.7          ----------   Pulmonary arteries                      Value        Reference  PA pressure, S, DP  27    mm Hg  <=30   Tricuspid valve                         Value        Reference  Tricuspid regurg peak velocity          247   cm/s   ----------  Tricuspid peak RV-RA gradient           24    mm Hg  ----------   Right atrium                            Value        Reference  RA ID, S-I, ES, A4C             (H)     60.3  mm     34 - 49  RA area, ES, A4C                (H)     21.6  cm^2   8.3 - 19.5  RA volume, ES, A/L                      65.2  ml     ----------  RA volume/bsa, ES, A/L                  28.5  ml/m^2 ----------   Systemic veins                          Value        Reference  Estimated CVP                           3     mm Hg  ----------   Right ventricle                         Value        Reference  RV ID, minor axis, ED, A4C              42.95 mm     26 - 43  RV ID, minor axis, ED, A4C base         56    mm     ----------  TAPSE                                   23.1  mm     ----------  RV pressure, S, DP                      27    mm Hg  <=30  RV s&', lateral, S                       15.9  cm/s    ---------- Legend: (L)  and  (H)  mark values outside specified reference range. ------------------------------------------------------------------- Prepared and Electronically Authenticated by Sanda Klein, MD 2018-09-18T13:09:48  US Abdomen Complete  Result Date: 07/21/2017 CLINICAL DATA:  Elevated liver function tests. EXAM: ABDOMEN ULTRASOUND COMPLETE COMPARISON:  None. FINDINGS: Gallbladder: Multiple gallstones are noted with the largest measuring 1.3 cm. No significant gallbladder wall thickening or pericholecystic fluid  is noted. Sludge is noted as well. No sonographic Murphy's sign is noted. Common bile duct: Diameter: 5.9 mm which is within normal limits. Liver: 1.1 cm simple cyst is noted in left hepatic lobe. Mildly increased echogenicity of hepatic parenchyma is noted suggesting fatty infiltration or hepatocellular disease. Portal vein is patent on color Doppler imaging with normal direction of blood flow towards the liver. IVC: No abnormality visualized. Pancreas: Visualized portion unremarkable. Spleen: Size and appearance within normal limits. Right Kidney: Length: 12.3 cm. Echogenicity within normal limits. No mass or hydronephrosis visualized. Left Kidney: Length: 13.1 cm. Echogenicity within normal limits. No mass or hydronephrosis visualized. Abdominal aorta: No aneurysm visualized. Other findings: None. IMPRESSION: Cholelithiasis without definite evidence of cholecystitis. Mildly increased echogenicity of hepatic parenchyma is noted suggesting fatty infiltration or diffuse hepatocellular disease. Small left hepatic cyst is noted. Electronically Signed   By: Marijo Conception, M.D.   On: 07/21/2017 09:54   CT ABDOMEN PELVIS W CONTRAST  Result Date: 07/21/2017 CLINICAL DATA:  Diffuse abdominal pain and distention for several days. Elevated liver function tests. Fever. EXAM: CT ABDOMEN AND PELVIS WITH CONTRAST TECHNIQUE: Multidetector CT imaging of the abdomen and pelvis was performed using  the standard protocol following bolus administration of intravenous contrast. CONTRAST:  135mL ISOVUE-300 IOPAMIDOL (ISOVUE-300) INJECTION 61% COMPARISON:  07/21/2017 abdominal sonogram. FINDINGS: Lower chest: Mild patchy tree-in-bud opacities in the dependent right lung base. Subsegmental atelectasis in the dependent lung bases. Hepatobiliary: Normal liver size. A few simple lateral segment left liver lobe cysts, largest 2.0 cm. Several subcentimeter hypodense lesions scattered throughout the liver, too small to characterize, for which no follow-up is required unless the patient has risk factors for liver malignancy. Nondistended gallbladder contains multiple calcified gallstones measuring up to 1.8 cm. No definite gallbladder wall thickening or pericholecystic fluid. No significant intrahepatic biliary ductal dilatation. Common bile duct diameter 6 mm, top-normal. There is a 4 mm calcified stone in the lower third of the common bile duct near the ampulla. There is mild haziness of the fat surrounding the common bile duct. Pancreas: Normal, with no mass or duct dilation. Spleen: Normal size. No mass. Adrenals/Urinary Tract: Normal adrenals. Normal kidneys with no hydronephrosis and no renal mass. Normal bladder. Stomach/Bowel: Small hiatal hernia. Otherwise nondistended and grossly normal stomach. Normal caliber small bowel with no small bowel wall thickening. Appendectomy. Moderate diffuse colonic diverticulosis, most prominent in the sigmoid colon, with no large bowel wall thickening or pericolonic fat stranding. 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 Patent portal, splenic, hepatic and renal veins. No pathologically enlarged lymph nodes in the abdomen or pelvis. Reproductive: Normal size prostate with nonspecific internal prostatic calcifications. Other: No pneumoperitoneum, ascites or focal fluid collection. Musculoskeletal: No aggressive appearing  focal osseous lesions. Moderate thoracolumbar spondylosis, most prominent at L5-S1. IMPRESSION: 1. Cholelithiasis.  No CT findings of acute cholecystitis. 2. Solitary 4 mm choledocholith in the lower third of the common bile duct near the ampulla. CBD diameter 6 mm, top-normal. No intrahepatic biliary ductal dilatation. 3. Haziness of the fat surrounding the common bile duct, cannot exclude ascending cholangitis. 4. Mild patchy tree-in-bud opacities at the dependent right lung base, compatible with mild aspiration or infectious bronchiolitis. 5. 3.1 cm Abdominal Aortic Aneurysm (ICD10-I71.9). Recommend follow-up aortic ultrasound in 3 years. This recommendation follows ACR consensus guidelines: White Paper of the ACR Incidental Findings Committee II on Vascular Findings. J Am Coll Radiol 2013; 10:789-794. 6.  Aortic Atherosclerosis (ICD10-I70.0). 7. Small hiatal hernia.  8. Moderate diffuse colonic diverticulosis. Electronically Signed   By: Ilona Sorrel M.D.   On: 07/21/2017 09:48   DG CHEST PORT 1 VIEW  Result Date: 07/21/2017 CLINICAL DATA:  Fevers EXAM: PORTABLE CHEST 1 VIEW COMPARISON:  01/06/2013 FINDINGS: The heart size and mediastinal contours are within normal limits. Both lungs are clear. The visualized skeletal structures are unremarkable. IMPRESSION: No active disease. Electronically Signed   By: Inez Catalina M.D.   On: 07/21/2017 07:47   CARDIAC CATHETERIZATION  Result Date: 07/20/2017  Mid Cx to Dist Cx lesion, 100 %stenosed.  Mid RCA lesion, 20 %stenosed.  Prox Cx lesion, 20 %stenosed.  Ost 1st Diag to 1st Diag lesion, 60 %stenosed.  Prox LAD lesion, 20 %stenosed.  The left ventricular systolic function is normal.  LV end diastolic pressure is normal.  The left ventricular ejection fraction is greater than 65% by visual estimate.  There is no mitral valve regurgitation.  1. There is right to left filling of a small lateral wall branch. This appears to be a total occlusion of the distal  Circumflex where the vessel becomes very small in caliber but cannot totally exclude this being a small Diagonal branch. The distal AV groove Circumflex changes quickly to a small caliber vessel with a lateral branch. This could the site of total occlusion. The vascular territory supplied by this branch is very small. 2. Moderate stenosis in the ostium of the large first Diagonal branch with excellent flow down the vessel. This vessel represents a dual LAD. This lesion does not appear to be ulcerated and doe not appear to be flow limiting. 3. Mild stenosis in the proximal LAD. The mid LAD is tortuous and there is subtle dye staining in this bend with quick dye washout. The LAD continues to the apex. 4. The RCA is a large dominant vessel with mild plaque. The distal right gives off a collateral to a very small lateral wall vessel. 5. Normal LV systolic function 6. 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 As above, LBBB induced with the placement of the pigtail catheter in the LV. Echo in am. It is unclear if his presentation is due to the occlusion of the small lateral wall branch. He is at the completion of the cath not having any symptoms.    Assessment & Plan:   Problem List Items Addressed This Visit       Cardiovascular and Mediastinum   Essential hypertension    Continue metoprolol and amlodipine      Atrial fibrillation (HCC)    Continue on metoprolol, amlodipine        Other   Stiffness in joint    Giles can stay on low-dose prednisone she will alternate every other day 2 and 3 mg or 2 and 1 mg depending on his condition; will monitor labs      PMR (polymyalgia rheumatica) (HCC) - Primary    Occasional stiffness On Deltasone 2.5 mg/d now Start Deltasone 3 mg qod and 2 mg qod Start B complex, Vit D      Hyperlipidemia    If arthralgias worse, take Lipitor every other day.   Alternatively, gels can stop it for a week to see if things are better.      Hyperglycemia    Will monitor A1c Jessee Avers can stay on low-dose prednisone she will alternate every other day 2 and 3 mg or 2 and 1  mg depending on his condition; will monitor labs         Meds ordered this encounter  Medications   predniSONE (DELTASONE) 1 MG tablet    Sig: Taper down Prednisone monthly - 3 mg/d qod and 2 mg/d qod pc or as directed    Dispense:  120 tablet    Refill:  5   Cholecalciferol (VITAMIN D3) 50 MCG (2000 UT) CAPS    Sig: Take 1 capsule (2,000 Units total) by mouth daily.    Dispense:  100 capsule    Refill:  3   B Complex-C (B-COMPLEX WITH VITAMIN C) tablet    Sig: Take 1 tablet by mouth daily.    Dispense:  100 tablet    Refill:  3      Follow-up: Return in about 3 months (around 04/23/2023) for a follow-up visit.  Walker Kehr, MD

## 2023-01-25 NOTE — Assessment & Plan Note (Signed)
-   Continue metoprolol 

## 2023-01-25 NOTE — Assessment & Plan Note (Signed)
Will monitor A1c Jessee Avers can stay on low-dose prednisone she will alternate every other day 2 and 3 mg or 2 and 1 mg depending on his condition; will monitor labs

## 2023-01-25 NOTE — Assessment & Plan Note (Signed)
Continue on metoprolol, amlodipine

## 2023-01-25 NOTE — Assessment & Plan Note (Signed)
Dustin Clayton can stay on low-dose prednisone she will alternate every other day 2 and 3 mg or 2 and 1 mg depending on his condition; will monitor labs

## 2023-01-25 NOTE — Assessment & Plan Note (Signed)
If arthralgias worse, take Lipitor every other day.  Alternatively, gels can stop it for a week to see if things are better.

## 2023-03-02 IMAGING — DX DG CERVICAL SPINE COMPLETE 4+V
6 series · 6 of 6 positions shown · non-contrast
Comparison: None.

CLINICAL DATA: Bilateral shoulder pain and hand weakness

EXAM:
CERVICAL SPINE - COMPLETE 4+ VIEW

[c-spine lat]
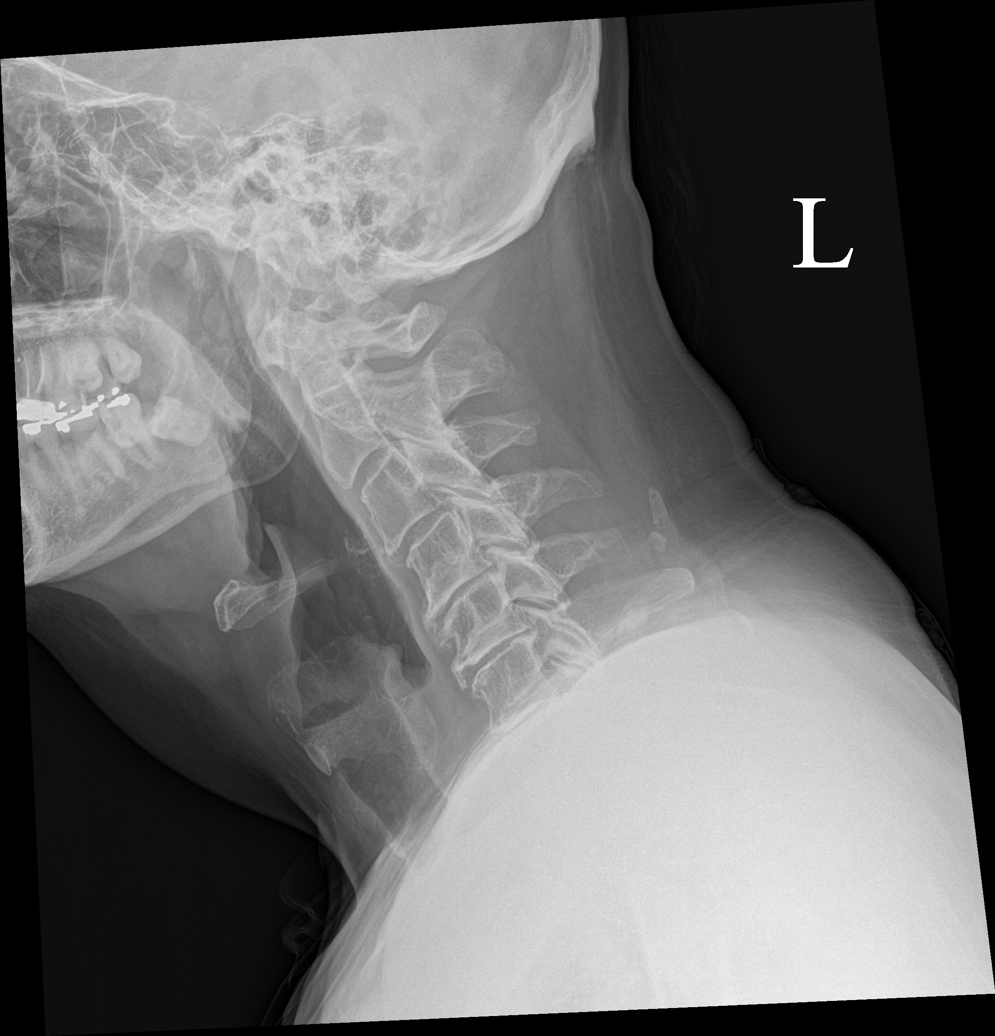

[c-spine obl (1 of 2)]
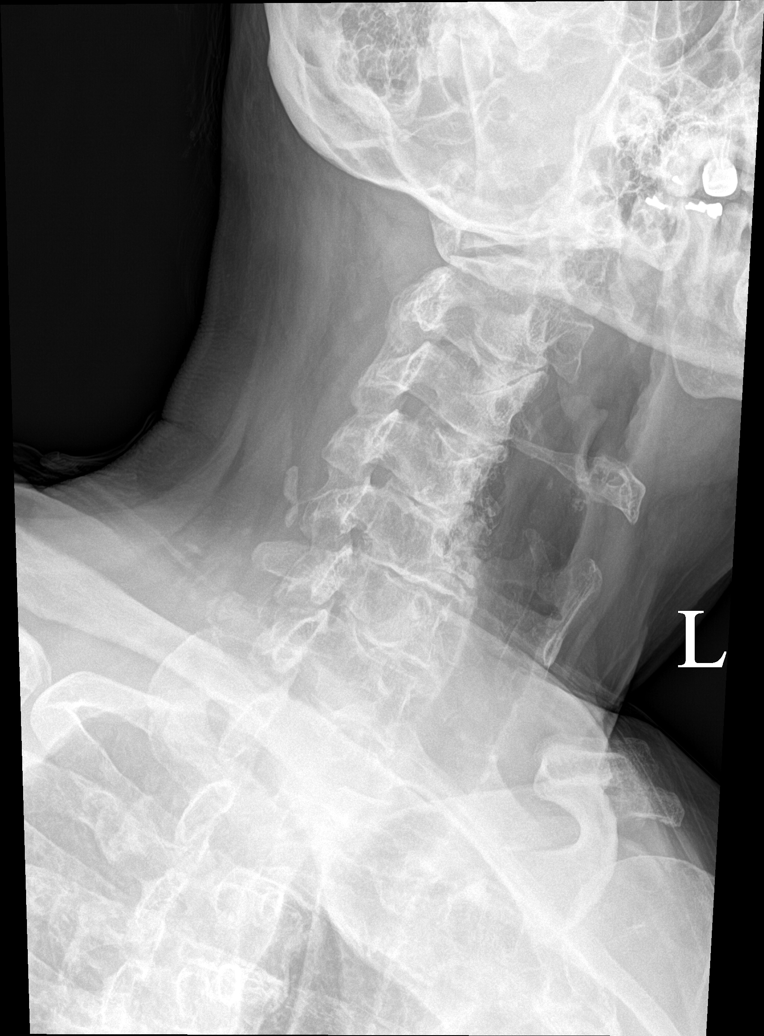

[c-spine obl (2 of 2)]
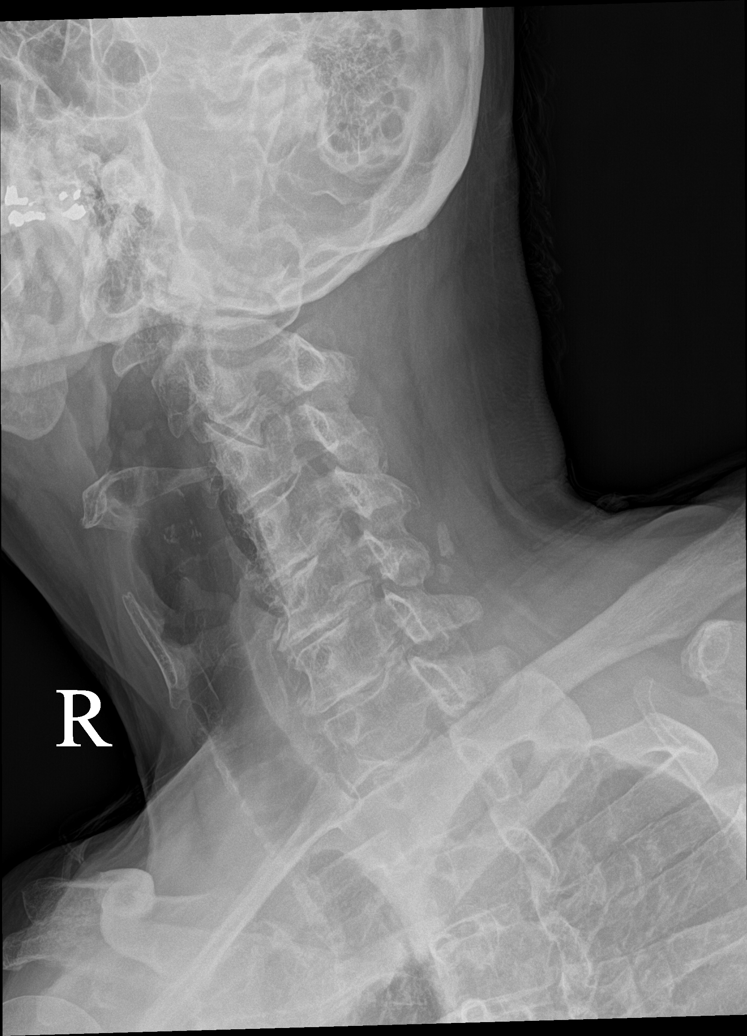

[c-spine ap]
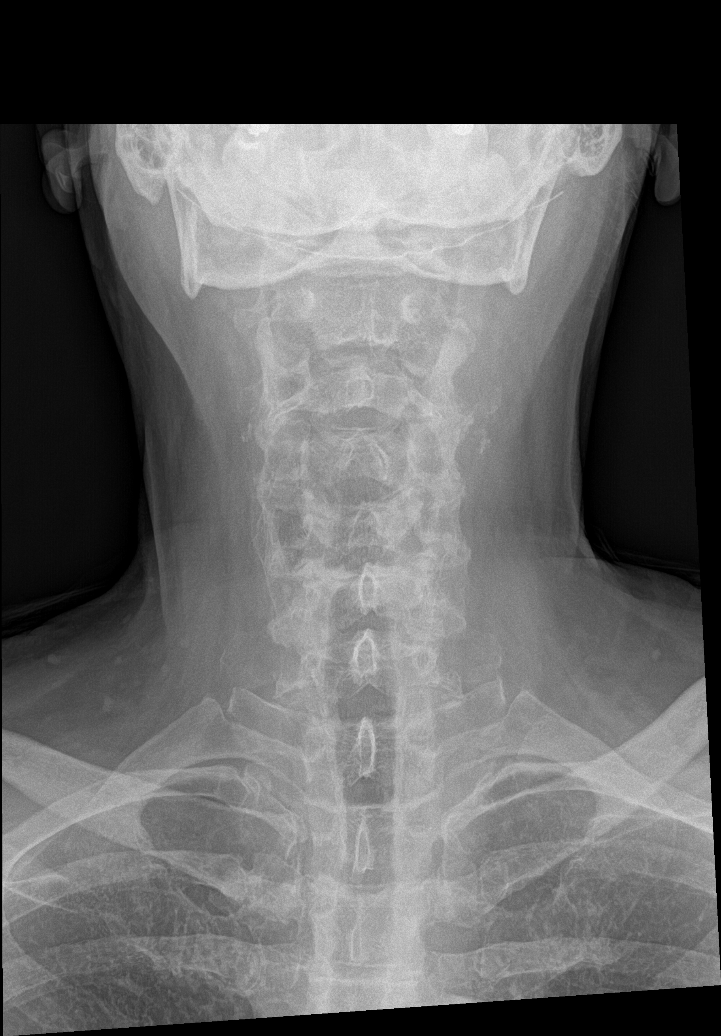

[c-spine open mouth]
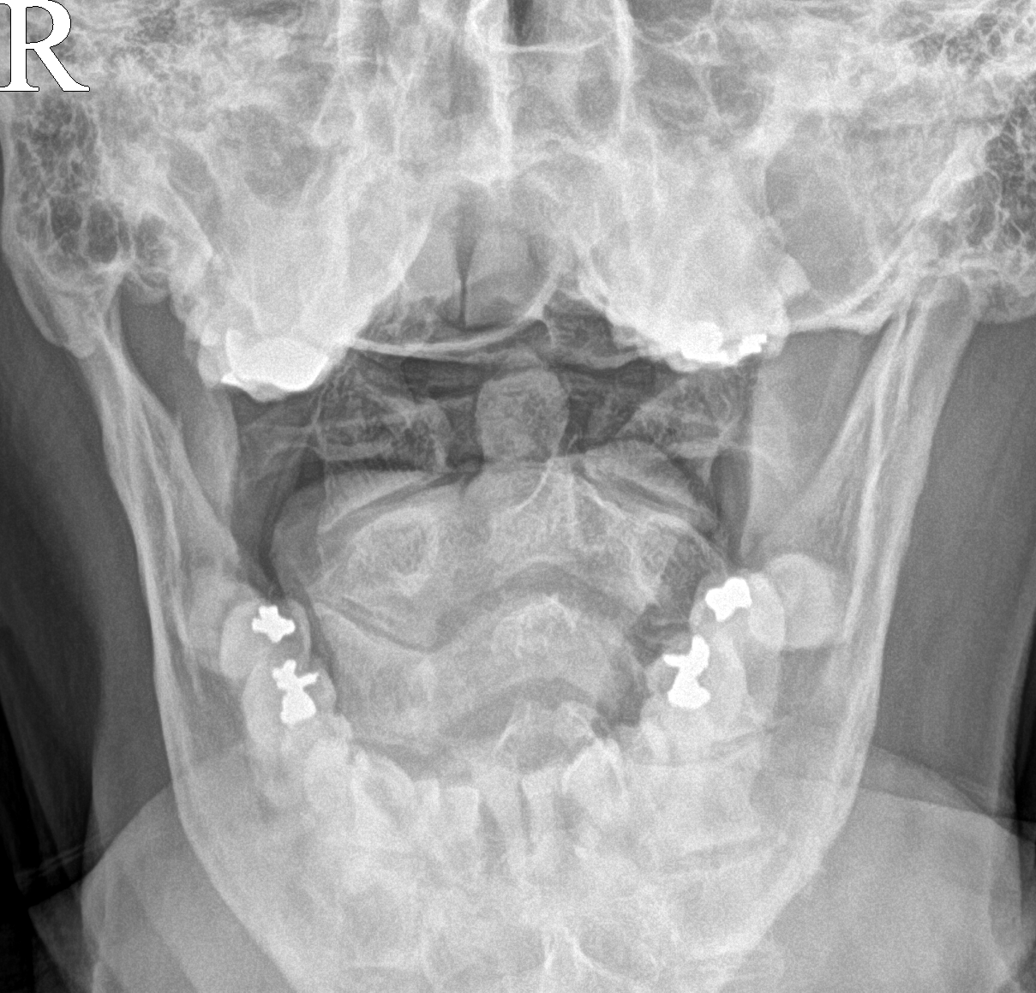

[ct-spine swimmers]
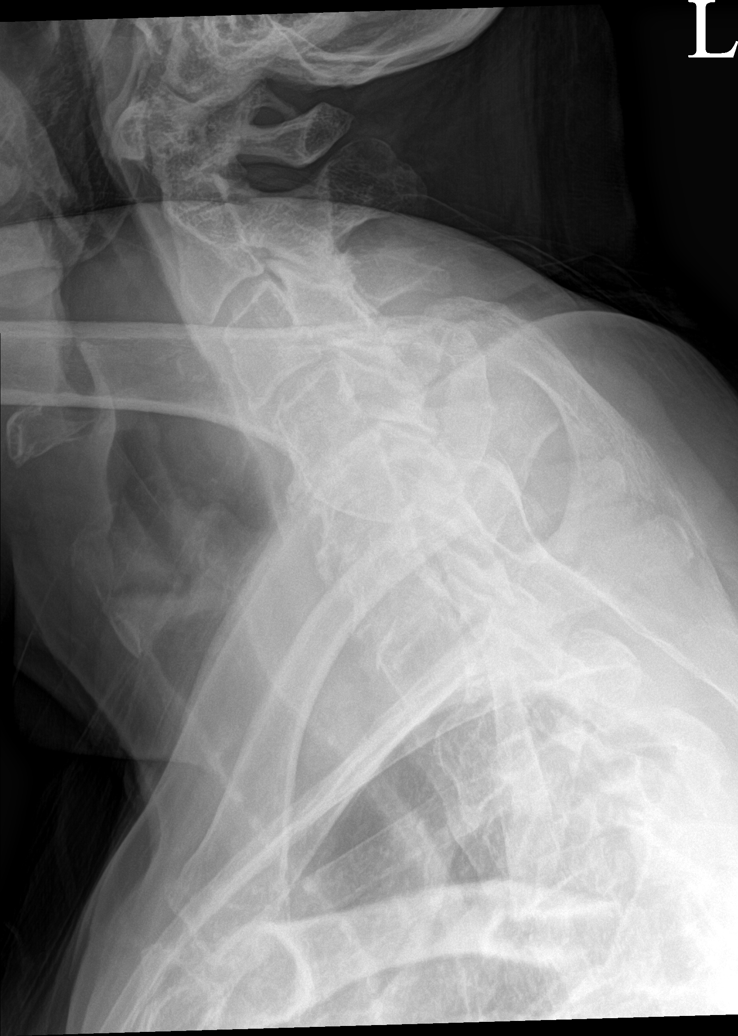

[6 of 6 positions shown; findings below may reference images not displayed]

FINDINGS: There is straightening of the cervical spine. No acute fracture or
listhesis. Vertebral body height has been preserved. There is
intervertebral disc space narrowing and endplate remodeling of
C3-C7, most severe at C4-C7 in keeping with changes of mild to
moderate degenerative disc disease. The prevertebral soft tissues
are not thickened. The spinal canal is widely patent. Oblique views
demonstrate mild to moderate right neuroforaminal narrowing at C4-5,
C5-6, and C6-7 secondary to uncovertebral arthrosis. On the left,
there is poor profiling of the lower neural foramen, however, at
least mild neuroforaminal narrowing is noted at C4-5 and C5-6
secondary to uncovertebral arthrosis.
IMPRESSION: Degenerative disc and degenerative joint disease with bilateral
neuroforaminal narrowing of C4-C7. MRI examination may be more
helpful to determine the degree of neural impingement.

## 2023-03-23 ENCOUNTER — Encounter: Payer: Self-pay | Admitting: Internal Medicine

## 2023-04-21 ENCOUNTER — Telehealth: Payer: Self-pay | Admitting: *Deleted

## 2023-04-21 ENCOUNTER — Ambulatory Visit (INDEPENDENT_AMBULATORY_CARE_PROVIDER_SITE_OTHER): Payer: Medicare Other | Admitting: Internal Medicine

## 2023-04-21 ENCOUNTER — Encounter: Payer: Self-pay | Admitting: Internal Medicine

## 2023-04-21 VITALS — BP 110/70 | HR 47 | Temp 97.9°F | Ht 71.0 in | Wt 220.0 lb

## 2023-04-21 DIAGNOSIS — N320 Bladder-neck obstruction: Secondary | ICD-10-CM | POA: Diagnosis not present

## 2023-04-21 DIAGNOSIS — I10 Essential (primary) hypertension: Secondary | ICD-10-CM | POA: Diagnosis not present

## 2023-04-21 DIAGNOSIS — R739 Hyperglycemia, unspecified: Secondary | ICD-10-CM

## 2023-04-21 DIAGNOSIS — M62830 Muscle spasm of back: Secondary | ICD-10-CM | POA: Diagnosis not present

## 2023-04-21 DIAGNOSIS — E785 Hyperlipidemia, unspecified: Secondary | ICD-10-CM

## 2023-04-21 DIAGNOSIS — E875 Hyperkalemia: Secondary | ICD-10-CM

## 2023-04-21 DIAGNOSIS — M25569 Pain in unspecified knee: Secondary | ICD-10-CM

## 2023-04-21 DIAGNOSIS — M79642 Pain in left hand: Secondary | ICD-10-CM

## 2023-04-21 DIAGNOSIS — G8929 Other chronic pain: Secondary | ICD-10-CM

## 2023-04-21 DIAGNOSIS — K760 Fatty (change of) liver, not elsewhere classified: Secondary | ICD-10-CM

## 2023-04-21 LAB — COMPREHENSIVE METABOLIC PANEL
ALT: 12 U/L (ref 0–53)
AST: 15 U/L (ref 0–37)
Albumin: 4.1 g/dL (ref 3.5–5.2)
Alkaline Phosphatase: 57 U/L (ref 39–117)
BUN: 13 mg/dL (ref 6–23)
CO2: 28 mEq/L (ref 19–32)
Calcium: 10.2 mg/dL (ref 8.4–10.5)
Chloride: 106 mEq/L (ref 96–112)
Creatinine, Ser: 0.89 mg/dL (ref 0.40–1.50)
GFR: 81.72 mL/min (ref >60.00)
Glucose, Bld: 97 mg/dL (ref 70–99)
Potassium: 6.2 mEq/L (ref 3.5–5.1)
Sodium: 143 mEq/L (ref 135–145)
Total Bilirubin: 1 mg/dL (ref 0.2–1.2)
Total Protein: 6.7 g/dL (ref 6.0–8.3)

## 2023-04-21 LAB — CBC WITH DIFFERENTIAL/PLATELET
Basophils Absolute: 0.1 10*3/uL (ref 0.0–0.1)
Basophils Relative: 1.3 % (ref 0.0–3.0)
Eosinophils Absolute: 0.2 10*3/uL (ref 0.0–0.7)
Eosinophils Relative: 3 % (ref 0.0–5.0)
HCT: 44.5 % (ref 39.0–52.0)
Hemoglobin: 14.4 g/dL (ref 13.0–17.0)
Lymphocytes Relative: 35.6 % (ref 12.0–46.0)
Lymphs Abs: 2.5 10*3/uL (ref 0.7–4.0)
MCHC: 32.3 g/dL (ref 30.0–36.0)
MCV: 100.4 fl — ABNORMAL HIGH (ref 78.0–100.0)
Monocytes Absolute: 0.8 10*3/uL (ref 0.1–1.0)
Monocytes Relative: 11.7 % (ref 3.0–12.0)
Neutro Abs: 3.4 10*3/uL (ref 1.4–7.7)
Neutrophils Relative %: 48.4 % (ref 43.0–77.0)
Platelets: 162 10*3/uL (ref 150.0–400.0)
RBC: 4.43 Mil/uL (ref 4.22–5.81)
RDW: 14.3 % (ref 11.5–15.5)
WBC: 7 10*3/uL (ref 4.0–10.5)

## 2023-04-21 LAB — HEMOGLOBIN A1C: Hgb A1c MFr Bld: 5.5 % (ref 4.6–6.5)

## 2023-04-21 LAB — SEDIMENTATION RATE: Sed Rate: 6 mm/hr (ref 0–20)

## 2023-04-21 LAB — PSA: PSA: 1.22 ng/mL (ref 0.10–4.00)

## 2023-04-21 MED ORDER — PREDNISONE 1 MG PO TABS: ORAL_TABLET | ORAL | 5 refills | Status: DC

## 2023-04-21 MED ORDER — CYCLOBENZAPRINE HCL 5 MG PO TABS: 5.0000 mg | ORAL_TABLET | Freq: Three times a day (TID) | ORAL | 0 refills | Status: DC | PRN

## 2023-04-21 NOTE — Telephone Encounter (Signed)
Critical Potassium 6.2 

## 2023-04-21 NOTE — Assessment & Plan Note (Signed)
Check LFTs 

## 2023-04-21 NOTE — Assessment & Plan Note (Signed)
If arthralgias worse, take Lipitor every other day.  Alternatively, gels can stop it for a week to see if things are better. 

## 2023-04-21 NOTE — Progress Notes (Signed)
Subjective:  Patient ID: Dustin Clayton, male    DOB: 07/26/1946  Age: 77 y.o. MRN: [id number]  CC: Follow-up (3 mnth f/u)   HPIHTN Dustin Clayton presents for PMR, CAD, HTN C/o L hand stiffness, knee pain  Outpatient Medications Prior to Visit  Medication Sig Dispense Refill   amLODipine (NORVASC) 2.5 MG tablet Take 1 tablet (2.5 mg total) by mouth daily. Take 2.5 mg by mouth daily. 90 tablet 3   atorvastatin (LIPITOR) 20 MG tablet Take 1 tablet (20 mg total) by mouth daily. 90 tablet 1   B Complex-C (B-COMPLEX WITH VITAMIN C) tablet Take 1 tablet by mouth daily. 100 tablet 3   Cholecalciferol (VITAMIN D3) 50 MCG (2000 UT) CAPS Take 1 capsule (2,000 Units total) by mouth daily. 100 capsule 3   Coenzyme Q10 (CO Q 10 PO) Take 1 capsule by mouth daily.     dorzolamide-timolol (COSOPT) 22.3-6.8 MG/ML ophthalmic solution Place 1 drop into the left eye 2 (two) times daily.     metoprolol tartrate (LOPRESSOR) 25 MG tablet Take 1 tablet (25 mg total) by mouth daily. 90 tablet 3   Multiple Vitamins-Minerals (MULTI FOR HIM) TABS Orally daily     cyclobenzaprine (FLEXERIL) 5 MG tablet Take 1 tablet (5 mg total) by mouth 3 (three) times daily as needed for muscle spasms. 30 tablet 0   predniSONE (DELTASONE) 1 MG tablet Taper down Prednisone monthly - 3 mg/d qod and 2 mg/d qod pc or as directed 120 tablet 5   Cholecalciferol (VITAMIN D3 PO) Take 1 capsule by mouth daily.     Flaxseed Oil (LINSEED OIL) OIL Take by mouth.     No facility-administered medications prior to visit.    ROS: Review of Systems  Constitutional:  Negative for appetite change, fatigue and unexpected weight change.  HENT:  Negative for congestion, nosebleeds, sneezing, sore throat and trouble swallowing.   Eyes:  Negative for itching and visual disturbance.  Respiratory:  Negative for cough.   Cardiovascular:  Negative for chest pain, palpitations and leg swelling.  Gastrointestinal:  Negative for abdominal distention,  blood in stool, diarrhea and nausea.  Genitourinary:  Negative for frequency and hematuria.  Musculoskeletal:  Positive for arthralgias and back pain. Negative for gait problem, joint swelling and neck pain.  Skin:  Negative for rash.  Neurological:  Negative for dizziness, tremors, speech difficulty and weakness.  Psychiatric/Behavioral:  Negative for agitation, dysphoric mood, sleep disturbance and suicidal ideas. The patient is not nervous/anxious.     Objective:  BP 110/70 (BP Location: Right Arm, Patient Position: Sitting, Cuff Size: Large)   Pulse (!) 47   Temp 97.9 F (36.6 C) (Oral)   Ht 5\' 11"  (1.803 m)   Wt 220 lb (99.8 kg)   SpO2 98%   BMI 30.68 kg/m   BP Readings from Last 3 Encounters:  04/21/23 110/70  01/21/23 120/82  10/22/22 118/60    Wt Readings from Last 3 Encounters:  04/21/23 220 lb (99.8 kg)  01/21/23 225 lb (102.1 kg)  10/22/22 226 lb (102.5 kg)    Physical Exam Constitutional:      General: He is not in acute distress.    Appearance: He is well-developed. He is obese.     Comments: NAD  Eyes:     Conjunctiva/sclera: Conjunctivae normal.     Pupils: Pupils are equal, round, and reactive to light.  Neck:     Thyroid: No thyromegaly.     Vascular: No JVD.  Cardiovascular:  Rate and Rhythm: Normal rate and regular rhythm.     Heart sounds: Normal heart sounds. No murmur heard.    No friction rub. No gallop.  Pulmonary:     Effort: Pulmonary effort is normal. No respiratory distress.     Breath sounds: Normal breath sounds. No wheezing or rales.  Chest:     Chest wall: No tenderness.  Abdominal:     General: Bowel sounds are normal. There is no distension.     Palpations: Abdomen is soft. There is no mass.     Tenderness: There is no abdominal tenderness. There is no guarding or rebound.  Musculoskeletal:        General: No tenderness. Normal range of motion.     Cervical back: Normal range of motion.  Lymphadenopathy:     Cervical: No  cervical adenopathy.  Skin:    General: Skin is warm and dry.     Findings: No rash.  Neurological:     Mental Status: He is alert and oriented to person, place, and time.     Cranial Nerves: No cranial nerve deficit.     Motor: No abnormal muscle tone.     Coordination: Coordination normal.     Gait: Gait normal.     Deep Tendon Reflexes: Reflexes are normal and symmetric.  Psychiatric:        Behavior: Behavior normal.        Thought Content: Thought content normal.        Judgment: Judgment normal.     Lab Results  Component Value Date   WBC 6.4 04/29/2022   HGB 15.1 04/29/2022   HCT 46.0 04/29/2022   PLT 176.0 04/29/2022   GLUCOSE 110 (H) 01/21/2023   CHOL 180 07/30/2022   TRIG 141.0 07/30/2022   HDL 85.80 07/30/2022   LDLCALC 66 07/30/2022   ALT 20 01/21/2023   AST 19 01/21/2023   NA 139 01/21/2023   K 4.0 01/21/2023   CL 102 01/21/2023   CREATININE 0.85 01/21/2023   BUN 13 01/21/2023   CO2 29 01/21/2023   TSH 4.73 08/12/2021   PSA 1.01 05/08/2022   INR 1.00 07/20/2017   HGBA1C 5.6 01/21/2023    ECHOCARDIOGRAM COMPLETE  Result Date: 07/21/2017                            *[place]*                   *[facility]*                         [address]                        [place], [place] [address]                            [phone] ------------------------------------------------------------------- Transthoracic Echocardiography Patient:    Dustin, Clayton MR #:       [id number] Study Date: 07/21/2017 Gender:     M Age:        50 Height:     180.3 cm Weight:     101.7 kg BSA:        2.28 m^2 Pt. 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 ------------------------------------------------------------------- History:   PMH:   Myocardial infarction.  Risk factors: Hypertension. ------------------------------------------------------------------- Study Conclusions - Left ventricle: The cavity size was normal. There was moderate   concentric hypertrophy. Systolic function was normal. The   estimated ejection fraction was in the range of 55% to 60%. Wall   motion was normal; there were no regional wall motion   abnormalities. Doppler parameters are consistent with abnormal   left ventricular relaxation (grade 1 diastolic dysfunction). - Left atrium: The atrium was mildly dilated. - Right ventricle: The cavity size was mildly dilated. - Atrial septum: No defect or patent foramen ovale was identified. ------------------------------------------------------------------- Study data:  No prior study was available for comparison.  Study status:  Routine.  Procedure:  Transthoracic echocardiography. Image quality was adequate.  Study completion:  There were no complications.          Transthoracic echocardiography.  M-mode, complete 2D, spectral Doppler, and color Doppler.  Birthdate: Patient birthdate: [birth year].  Age:  Patient is 77 yr old.  Sex: Gender: male.    BMI: 31.3 kg/m^2.  Blood pressure:     127/69 Patient status:  Inpatient.  Study date:  Study date: 07/21/2017. Study time: 10:52 AM.  Location:  Bedside. ------------------------------------------------------------------- ------------------------------------------------------------------- Left ventricle:  The cavity size was normal. There was moderate concentric hypertrophy. Systolic function was normal. The estimated ejection fraction was in the range of 55% to 60%. Wall motion was normal; there were no regional wall motion abnormalities. Doppler parameters are consistent with abnormal  left ventricular relaxation (grade 1 diastolic dysfunction). There was no evidence of elevated ventricular filling pressure by Doppler parameters. ------------------------------------------------------------------- Aortic valve:   Structurally normal valve. Trileaflet. Cusp separation was normal.  Doppler:  Transvalvular velocity was within the normal range. There was no stenosis. There was no regurgitation. ------------------------------------------------------------------- Aorta:  Aortic root: The aortic root was normal in size. Ascending aorta: The ascending aorta was normal in size. ------------------------------------------------------------------- Mitral valve:   Structurally normal valve.   Leaflet separation was normal.  Doppler:  Transvalvular velocity was within the normal range. There was no evidence for stenosis. There was no regurgitation. ------------------------------------------------------------------- Left atrium:  The atrium was mildly dilated. ------------------------------------------------------------------- Atrial septum:  No defect or patent foramen ovale was identified.  ------------------------------------------------------------------- Right ventricle:  The cavity size was mildly dilated. Systolic function was normal. ------------------------------------------------------------------- Pulmonic valve:    Structurally normal valve.   Cusp separation was normal.  Doppler:  Transvalvular velocity was within the normal range. There was trivial regurgitation. ------------------------------------------------------------------- Tricuspid valve:   Structurally normal valve.   Leaflet separation was normal.  Doppler:  Transvalvular velocity was within the normal range. There was trivial regurgitation. ------------------------------------------------------------------- Pulmonary artery:   Systolic pressure was within the normal range.   ------------------------------------------------------------------- Right atrium:  The atrium was normal in size. ------------------------------------------------------------------- Pericardium:  There was no pericardial effusion. ------------------------------------------------------------------- Systemic veins: Inferior vena cava: The vessel was normal in size. The respirophasic diameter changes were in the normal range (= 50%), consistent with normal central venous pressure. ------------------------------------------------------------------- Measurements  Left ventricle                          Value  Reference  LV ID, ED, PLAX chordal                 52    mm     43 - 52  LV ID, ES, PLAX chordal                 30    mm     23 - 38  LV fx shortening, PLAX chordal          42    %      >=29  LV PW thickness, ED                     15    mm     ----------  IVS/LV PW ratio, ED                     1            <=1.3  Stroke volume, 2D                       74    ml     ----------  Stroke volume/bsa, 2D                   32    ml/m^2 ----------  LV e&', lateral                          6.53  cm/s   ----------  LV e&', medial                           4.9   cm/s   ----------  LV e&', average                          5.72  cm/s   ----------   Ventricular septum                      Value        Reference  IVS thickness, ED                       15    mm     ----------   LVOT                                    Value        Reference  LVOT ID, S                              20    mm     ----------  LVOT area                               3.14  cm^2   ----------  LVOT peak velocity, S                   121   cm/s   ----------  LVOT mean velocity, S                   80.7  cm/s   ----------  LVOT VTI,  S                             23.6  cm     ----------  LVOT peak gradient, S                   6     mm Hg  ----------   Aorta                                   Value        Reference  Aortic root ID, ED                       34    mm     ----------   Left atrium                             Value        Reference  LA ID, A-P, ES                          40    mm     ----------  LA ID/bsa, A-P                          1.75  cm/m^2 <=2.2  LA volume, S                            60    ml     ----------  LA volume/bsa, S                        26.3  ml/m^2 ----------  LA volume, ES, 1-p A4C                  65.2  ml     ----------  LA volume/bsa, ES, 1-p A4C              28.5  ml/m^2 ----------  LA volume, ES, 1-p A2C                  48.9  ml     ----------  LA volume/bsa, ES, 1-p A2C              21.4  ml/m^2 ----------   Mitral valve                            Value        Reference  Mitral deceleration time        (H)     289   ms     150 - 230  Mitral E/A ratio, peak                  0.7          ----------   Pulmonary arteries                      Value        Reference  PA pressure, S, DP  27    mm Hg  <=30   Tricuspid valve                         Value        Reference  Tricuspid regurg peak velocity          247   cm/s   ----------  Tricuspid peak RV-RA gradient           24    mm Hg  ----------   Right atrium                            Value        Reference  RA ID, S-I, ES, A4C             (H)     60.3  mm     34 - 49  RA area, ES, A4C                (H)     21.6  cm^2   8.3 - 19.5  RA volume, ES, A/L                      65.2  ml     ----------  RA volume/bsa, ES, A/L                  28.5  ml/m^2 ----------   Systemic veins                          Value        Reference  Estimated CVP                           3     mm Hg  ----------   Right ventricle                         Value        Reference  RV ID, minor axis, ED, A4C              42.95 mm     26 - 43  RV ID, minor axis, ED, A4C base         56    mm     ----------  TAPSE                                   23.1  mm     ----------  RV pressure, S, DP                      27    mm Hg  <=30  RV s&', lateral, S                       15.9  cm/s    ---------- Legend: (L)  and  (H)  mark values outside specified reference range. ------------------------------------------------------------------- Prepared and Electronically Authenticated by Sanda Klein, MD 2018-09-18T13:09:48  US Abdomen Complete  Result Date: 07/21/2017 CLINICAL DATA:  Elevated liver function tests. EXAM: ABDOMEN ULTRASOUND COMPLETE COMPARISON:  None. FINDINGS: Gallbladder: Multiple gallstones are noted with the largest measuring 1.3 cm. No significant gallbladder wall thickening or pericholecystic fluid  is noted. Sludge is noted as well. No sonographic Murphy's sign is noted. Common bile duct: Diameter: 5.9 mm which is within normal limits. Liver: 1.1 cm simple cyst is noted in left hepatic lobe. Mildly increased echogenicity of hepatic parenchyma is noted suggesting fatty infiltration or hepatocellular disease. Portal vein is patent on color Doppler imaging with normal direction of blood flow towards the liver. IVC: No abnormality visualized. Pancreas: Visualized portion unremarkable. Spleen: Size and appearance within normal limits. Right Kidney: Length: 12.3 cm. Echogenicity within normal limits. No mass or hydronephrosis visualized. Left Kidney: Length: 13.1 cm. Echogenicity within normal limits. No mass or hydronephrosis visualized. Abdominal aorta: No aneurysm visualized. Other findings: None. IMPRESSION: Cholelithiasis without definite evidence of cholecystitis. Mildly increased echogenicity of hepatic parenchyma is noted suggesting fatty infiltration or diffuse hepatocellular disease. Small left hepatic cyst is noted. Electronically Signed   By: Marijo Conception, M.D.   On: 07/21/2017 09:54   CT ABDOMEN PELVIS W CONTRAST  Result Date: 07/21/2017 CLINICAL DATA:  Diffuse abdominal pain and distention for several days. Elevated liver function tests. Fever. EXAM: CT ABDOMEN AND PELVIS WITH CONTRAST TECHNIQUE: Multidetector CT imaging of the abdomen and pelvis was performed using  the standard protocol following bolus administration of intravenous contrast. CONTRAST:  135mL ISOVUE-300 IOPAMIDOL (ISOVUE-300) INJECTION 61% COMPARISON:  07/21/2017 abdominal sonogram. FINDINGS: Lower chest: Mild patchy tree-in-bud opacities in the dependent right lung base. Subsegmental atelectasis in the dependent lung bases. Hepatobiliary: Normal liver size. A few simple lateral segment left liver lobe cysts, largest 2.0 cm. Several subcentimeter hypodense lesions scattered throughout the liver, too small to characterize, for which no follow-up is required unless the patient has risk factors for liver malignancy. Nondistended gallbladder contains multiple calcified gallstones measuring up to 1.8 cm. No definite gallbladder wall thickening or pericholecystic fluid. No significant intrahepatic biliary ductal dilatation. Common bile duct diameter 6 mm, top-normal. There is a 4 mm calcified stone in the lower third of the common bile duct near the ampulla. There is mild haziness of the fat surrounding the common bile duct. Pancreas: Normal, with no mass or duct dilation. Spleen: Normal size. No mass. Adrenals/Urinary Tract: Normal adrenals. Normal kidneys with no hydronephrosis and no renal mass. Normal bladder. Stomach/Bowel: Small hiatal hernia. Otherwise nondistended and grossly normal stomach. Normal caliber small bowel with no small bowel wall thickening. Appendectomy. Moderate diffuse colonic diverticulosis, most prominent in the sigmoid colon, with no large bowel wall thickening or pericolonic fat stranding. 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 Patent portal, splenic, hepatic and renal veins. No pathologically enlarged lymph nodes in the abdomen or pelvis. Reproductive: Normal size prostate with nonspecific internal prostatic calcifications. Other: No pneumoperitoneum, ascites or focal fluid collection. Musculoskeletal: No aggressive appearing  focal osseous lesions. Moderate thoracolumbar spondylosis, most prominent at L5-S1. IMPRESSION: 1. Cholelithiasis.  No CT findings of acute cholecystitis. 2. Solitary 4 mm choledocholith in the lower third of the common bile duct near the ampulla. CBD diameter 6 mm, top-normal. No intrahepatic biliary ductal dilatation. 3. Haziness of the fat surrounding the common bile duct, cannot exclude ascending cholangitis. 4. Mild patchy tree-in-bud opacities at the dependent right lung base, compatible with mild aspiration or infectious bronchiolitis. 5. 3.1 cm Abdominal Aortic Aneurysm (ICD10-I71.9). Recommend follow-up aortic ultrasound in 3 years. This recommendation follows ACR consensus guidelines: White Paper of the ACR Incidental Findings Committee II on Vascular Findings. J Am Coll Radiol 2013; 10:789-794. 6.  Aortic Atherosclerosis (ICD10-I70.0). 7. Small hiatal hernia.  8. Moderate diffuse colonic diverticulosis. Electronically Signed   By: Delbert Phenix M.D.   On: 07/21/2017 09:48   DG CHEST PORT 1 VIEW  Result Date: 07/21/2017 CLINICAL DATA:  Fevers EXAM: PORTABLE CHEST 1 VIEW COMPARISON:  01/06/2013 FINDINGS: The heart size and mediastinal contours are within normal limits. Both lungs are clear. The visualized skeletal structures are unremarkable. IMPRESSION: No active disease. Electronically Signed   By: Alcide Clever M.D.   On: 07/21/2017 07:47   CARDIAC CATHETERIZATION  Result Date: 07/20/2017  Mid Cx to Dist Cx lesion, 100 %stenosed.  Mid RCA lesion, 20 %stenosed.  Prox Cx lesion, 20 %stenosed.  Ost 1st Diag to 1st Diag lesion, 60 %stenosed.  Prox LAD lesion, 20 %stenosed.  The left ventricular systolic function is normal.  LV end diastolic pressure is normal.  The left ventricular ejection fraction is greater than 65% by visual estimate.  There is no mitral valve regurgitation.  1. There is right to left filling of a small lateral wall branch. This appears to be a total occlusion of the distal  Circumflex where the vessel becomes very small in caliber but cannot totally exclude this being a small Diagonal branch. The distal AV groove Circumflex changes quickly to a small caliber vessel with a lateral branch. This could the site of total occlusion. The vascular territory supplied by this branch is very small. 2. Moderate stenosis in the ostium of the large first Diagonal branch with excellent flow down the vessel. This vessel represents a dual LAD. This lesion does not appear to be ulcerated and doe not appear to be flow limiting. 3. Mild stenosis in the proximal LAD. The mid LAD is tortuous and there is subtle dye staining in this bend with quick dye washout. The LAD continues to the apex. 4. The RCA is a large dominant vessel with mild plaque. The distal right gives off a collateral to a very small lateral wall vessel. 5. Normal LV systolic function 6. 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 As above, LBBB induced with the placement of the pigtail catheter in the LV. Echo in am. It is unclear if his presentation is due to the occlusion of the small lateral wall branch. He is at the completion of the cath not having any symptoms.    Assessment & Plan:   Problem List Items Addressed This Visit     Hyperlipidemia    If arthralgias worse, take Lipitor every other day.  Alternatively, gels can stop it for a week to see if things are better.      Hyperglycemia    Will monitor A1c       Relevant Orders   CBC with Differential/Platelet   Comprehensive metabolic panel   Hemoglobin A1c   Back muscle spasm - Primary    Flexeril prn      Relevant Orders   Ambulatory referral to Physical Therapy   Sedimentation rate   Essential hypertension    Continue metoprolol and amlodipine      Relevant Orders   CBC with Differential/Platelet   Comprehensive metabolic panel   Hemoglobin  A1c   Fatty liver    Check  LFTs      Other Visit Diagnoses     Chronic knee pain, unspecified laterality       Relevant Medications   predniSONE (DELTASONE) 1 MG tablet   cyclobenzaprine (FLEXERIL) 5 MG tablet  Other Relevant Orders   Ambulatory referral to Physical Therapy   Hand pain, left       Relevant Orders   Ambulatory referral to Physical Therapy   Bladder neck obstruction       Relevant Orders   PSA         Meds ordered this encounter  Medications   predniSONE (DELTASONE) 1 MG tablet    Sig: Taper down Prednisone monthly - 2 mg/d qod and 1 mg/d qod pc or as directed    Dispense:  120 tablet    Refill:  5   cyclobenzaprine (FLEXERIL) 5 MG tablet    Sig: Take 1 tablet (5 mg total) by mouth 3 (three) times daily as needed for muscle spasms.    Dispense:  30 tablet    Refill:  0      Follow-up: Return in about 4 months (around 08/21/2023) for a follow-up visit.  Sonda Primes, MD

## 2023-04-21 NOTE — Assessment & Plan Note (Signed)
Flexeril prn  

## 2023-04-21 NOTE — Telephone Encounter (Signed)
Noted. Thanks.

## 2023-04-21 NOTE — Assessment & Plan Note (Signed)
-   Continue metoprolol 

## 2023-04-21 NOTE — Assessment & Plan Note (Signed)
Will monitor A1c

## 2023-04-22 ENCOUNTER — Encounter: Payer: Self-pay | Admitting: Internal Medicine

## 2023-04-22 NOTE — Addendum Note (Signed)
Addended by: Tresa Garter on: 04/22/2023 12:01 AM   Modules accepted: Orders

## 2023-05-12 ENCOUNTER — Telehealth: Payer: Self-pay | Admitting: Physical Therapy

## 2023-05-12 ENCOUNTER — Encounter: Payer: Self-pay | Admitting: Physical Therapy

## 2023-05-12 ENCOUNTER — Ambulatory Visit: Payer: Medicare Other | Attending: Internal Medicine | Admitting: Physical Therapy

## 2023-05-12 DIAGNOSIS — M79642 Pain in left hand: Secondary | ICD-10-CM | POA: Insufficient documentation

## 2023-05-12 DIAGNOSIS — G5630 Lesion of radial nerve, unspecified upper limb: Secondary | ICD-10-CM

## 2023-05-12 DIAGNOSIS — M62830 Muscle spasm of back: Secondary | ICD-10-CM | POA: Insufficient documentation

## 2023-05-12 DIAGNOSIS — M25569 Pain in unspecified knee: Secondary | ICD-10-CM | POA: Insufficient documentation

## 2023-05-12 DIAGNOSIS — M6281 Muscle weakness (generalized): Secondary | ICD-10-CM | POA: Insufficient documentation

## 2023-05-12 DIAGNOSIS — G8929 Other chronic pain: Secondary | ICD-10-CM | POA: Insufficient documentation

## 2023-05-12 NOTE — Telephone Encounter (Signed)
Dr. Posey Rea, Dustin Clayton "Dustin Clayton" was seen for PT eval for chronic knee pain, back spasm and Lt wrist pain.  He states he wants to address the Lt wrist weakness only - states the knee pain and back spasm are not an issue for hiim at this time - not enough to warrant skilled PT.  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  If you agree, would you please place an order for OT eval and treat for possible radial nerve palsy.  PT eval deferred per pt's request.    Thank you, Kerry Fort, PT

## 2023-05-12 NOTE — Therapy (Signed)
[facility] [address] [place], [place], [address] Phone: [phone]   Fax:  [phone]  Physical Therapy Evaluation/ARRIVED NO CHARGE  Patient Details  Name: Dustin Clayton MRN: [id number] Date of Birth: 09-Oct-1946 Referring Physician:  Jacinta Shoe, MD  Encounter Date: 05/12/2023   PT End of Session - 05/12/23 1221     Visit Number 1    Number of Visits 1    PT Start Time 1147    PT Stop Time 1205    PT Time Calculation (min) 18 min             Past Medical History:  Diagnosis Date   Acute cholangitis    Alcohol abuse    Arrhythmia    CAD (coronary artery disease), native coronary artery    Common bile duct calculus    Duodenal ulcer    Gastritis    HTN (hypertension)    Hypercholesterolemia    Thrombocytopenia (HCC)     Past Surgical History:  Procedure Laterality Date   APPENDECTOMY     ERCP N/A 07/24/2017   Procedure: ENDOSCOPIC RETROGRADE CHOLANGIOPANCREATOGRAPHY (ERCP);  Surgeon: Vida Rigger, MD;  Location: [facility] ENDOSCOPY;  Service: Endoscopy;  Laterality: N/A;   EYE SURGERY     LEFT HEART CATH AND CORONARY ANGIOGRAPHY N/A 07/20/2017   Procedure: LEFT HEART CATH AND CORONARY ANGIOGRAPHY;  Surgeon: Kathleene Hazel, MD;  Location: [facility] INVASIVE CV LAB;  Service: Cardiovascular;  Laterality: N/A;   TONSILLECTOMY      There were no vitals filed for this visit.     Pt arrived to PT evaluation with referring diagnoses of chronic knee pain, back spasm, and Lt wrist pain.  Pt reports he only wants to address the Lt wrist weakness - he states that the knee pain is not too bad and that the back spasm only occurs intermittently and neither is a problem for him at this time.  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  Pt reports this problem started in 2022.   Pt would benefit from an OT evaluation to address  LUE weakness and for a custom splint fabrication for Lt wrist positioning.  PT eval deferred at this time per pt's request - order for OT referral - eval and treat- has been requested from Dr. Posey Rea.  OT eval to be scheduled when referral has been received.                                          Patient will benefit from skilled therapeutic intervention in order to improve the following deficits and impairments:     Visit Diagnosis: Muscle weakness (generalized)     Problem List Patient Active Problem List   Diagnosis Date Noted   Abnormal computed tomography of gallbladder 01/21/2023   Abnormal findings on diagnostic imaging of liver and biliary tract 04/29/2022   Elevated levels of transaminase & lactic acid dehydrogenase 04/29/2022   Hematochezia 04/29/2022   Personal history of colonic polyps 04/29/2022   Sebaceous cyst 04/29/2022   Detached retina, left 01/23/2022   Contracture of hand 12/30/2021   Edema 10/29/2021   Hyperglycemia 10/29/2021   Contracture of joint of left hand 09/11/2021   Congestive heart failure (CHF) (HCC) 08/12/2021   Hyperlipidemia  08/12/2021   Shoulder pain, bilateral 08/12/2021   Stiffness in joint 08/12/2021   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/12/2021   Atrial fibrillation (HCC) 08/12/2021   Arthritis 08/02/2021   Back muscle spasm 11/30/2017   Essential hypertension 09/15/2017   Heart disease 09/15/2017   Peptic ulcer of duodenum    Cholangitis due to bile duct calculus with obstruction    Abnormal LFTs (liver function tests)    Demand ischemia    Thrombocytopenia (HCC) 07/20/2017   Plantar fasciitis, left 02/23/2017   Blood per rectum 05/08/2016   Colon polyps 05/08/2016   Diverticulosis 05/08/2016   Preventative health care 03/07/2015   Aneurysm of aorta (HCC) 12/21/2014   Fatty liver 12/21/2014   Gallstones 12/21/2014    Dustin Clayton, Dustin Clayton, PT 05/12/2023, 12:23 PM  [facility] [address] [place], [place], [address] Phone: [phone]   Fax:  [phone]  Name: Dustin Clayton MRN: [id number] Date of Birth: [birth year]

## 2023-05-14 NOTE — Telephone Encounter (Signed)
Okay.  Thanks.

## 2023-06-13 ENCOUNTER — Other Ambulatory Visit: Payer: Self-pay | Admitting: Cardiology

## 2023-06-13 ENCOUNTER — Encounter: Payer: Self-pay | Admitting: Cardiology

## 2023-06-15 MED ORDER — ATORVASTATIN CALCIUM 20 MG PO TABS: 20.0000 mg | ORAL_TABLET | Freq: Every day | ORAL | 0 refills | Status: DC

## 2023-06-15 NOTE — Progress Notes (Unsigned)
Cardiology Clinic Note   Date: 06/16/2023 ID: Dustin Clayton, DOB Oct 12, 1946, MRN [id number]  Primary Cardiologist:  Peter Swaziland, MD  Patient Profile    Dustin Clayton is a 77 y.o. male who presents to the clinic today for routine follow up.     Past medical history significant for: CAD. LHC 07/20/2017 (lateral wall MI): Mid to distal LCx 100%.  Mid RCA 20%.  Proximal CX 20%.  Ostial D1-D1 60%.  Proximal LAD 20%. There is right to left filling of a small lateral wall branch. This appears to be a total occlusion of the distal Circumflex where the vessel becomes very small in caliber but cannot totally exclude this being a small Diagonal branch. The distal AV groove Circumflex changes quickly to a small caliber vessel with a lateral branch. This could the site of total occlusion. The vascular territory supplied by this branch is very small. Moderate stenosis in the ostium of the large first Diagonal branch with excellent flow down the vessel. This vessel represents a dual LAD. Mild stenosis in the proximal LAD. The mid LAD is tortuous and there is subtle dye staining in this bend with quick dye washout. The LAD continues to the apex. The RCA is a large dominant vessel with mild plaque. The distal right gives off a collateral to a very small lateral wall vessel. Normal LV systolic function. 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  Recommend medical management. Echo 07/21/2017: EF 55 to 60%.  Grade I DD.  Mild LAE.  Mild RVH. Persistent A-fib. Onset June 2022. Chronic HFmrEF. Echo 07/11/2021: EF 45 to 50%.  Global hypokinesis.  Mild asymmetric LVH of the posterior segment.  Diastolic function could not be evaluated.  Normal RV function.  Moderate RVH.  Severe LAE.  Mild RAE.  Mild aortic valve sclerosis without stenosis.  Mild dilatation of the aortic root 38 mm. Aortic root dilatation. Hypertension. Hyperlipidemia. Panel 07/30/2022: LDL 60, HDL 86, TG 141, total 180. PMR.      History of Present Illness    Dustin Clayton patient was first evaluated by cardiology on 07/20/2017 for STEMI.  Patient presented to the ED via EMS with complaints of epigastric pain, weakness, nausea, dyspnea.  EKG showed anterior ST elevation and code STEMI was called.  Patient was taken emergently to the Cath Lab which showed CTO of distal LCx, 60 to 70% ostial D1 stenosis but no clear culprit.  EF normal by cath and echo.  It was felt troponin elevation was due to demand ischemia and presentation was not primarily cardiac.  Review of EKG showed no change compared to old EKGs.  He had elevated LFTs and underwent ERCP which showed retained CBD stone.  He had ERCP with sphincterotomy and LFTs trended down.  He was also found to have gastritis and duodenal ulcers without active bleeding treated with PPI.  He had a history of EtOH abuse.  Patient has been seen in the office by Dr. Swaziland on 06/19/2021 at the request of Dr. Riley Nearing.  During physical in June EKG showed A-fib with rate of 87 and LBBB.  Eliquis was recommended for stroke prophylaxis but patient deferred.  Echo showed EF 45 to 50%, global hypokinesis, severe LAE (details above).  Sherryll Burger was recommended but patient deferred.  Patient was last seen in the office by Dr. Swaziland on 03/11/2022 for routine follow-up.  He denied palpitations, chest pain, dyspnea.  He had surgery for detached retina the month prior.  EKG showed A-fib,  rate 79 bpm, LBBB.  He had previously stopped statin secondary to myalgias, however it was discovered he had PMR.  It was recommended he restart statins.  Eliquis was once again recommended and patient deferred.  Today, patient is accompanied by his wife. He reports a recent flare of PMR with bilateral shoulder pain and difficulty reaching. He increased his prednisone but he does not think it is helping. He reports mild increase in DOE particularly with taking the garbage to the curb. He does not feel concerned about this.  He is able to do routine activities without dyspnea. He does all the cooking and housework without difficulty. He sometimes has to take a short break but is able to return to the activity and complete it. No chest pain, pressure, or tightness. Mild ankle edema. No orthopnea or PND. No palpitations.      ROS: All other systems reviewed and are otherwise negative except as noted in History of Present Illness.  Studies Reviewed    EKG Interpretation Date/Time:  Tuesday June 16 2023 14:01:21 EDT Ventricular Rate:  90 PR Interval:    QRS Duration:  156 QT Interval:  404 QTC Calculation: 494 R Axis:   -10  Text Interpretation: Atrial fibrillation Left bundle branch block When compared with ECG of 03/11/2022 (not in muse) No significant changes Confirmed by Carlos Levering [phone]) on 06/16/2023 2:08:04 PM    Risk Assessment/Calculations     CHA2DS2-VASc Score = 5   This indicates a 7.2% annual risk of stroke. The patient's score is based upon: CHF History: 1 HTN History: 1 Diabetes History: 0 Stroke History: 0 Vascular Disease History: 1 Age Score: 2 Gender Score: 0  Patient declines anticoagulation.            Physical Exam    VS:  BP 126/80   Pulse 90   Ht 5\' 11"  (1.803 m)   Wt 220 lb 9.6 oz (100.1 kg)   SpO2 98%   BMI 30.77 kg/m  , BMI Body mass index is 30.77 kg/m.  GEN: Well nourished, well developed, in no acute distress. Neck: No JVD or carotid bruits. Cardiac: Irregular rhythm, controlled rate. No murmurs. No rubs or gallops.   Respiratory:  Respirations regular and unlabored. Clear to auscultation without rales, wheezing or rhonchi. GI: Soft, nontender, nondistended. Extremities: Radials/DP/PT 2+ and equal bilaterally. No clubbing or cyanosis. Mild, nonpitting ankle edema bilaterally.   Skin: Warm and dry, no rash. Neuro: Strength intact on the left. Contracture of left hand.   Assessment & Plan    CAD.  LHC in the setting of lateral wall STEMI showed  CTO of distal LCx, 60 to 70% ostial D1 but no clear culprit.  Patient was treated medically. Patient denies chest pain, pressure or tightness. He does not follow a regular exercise routine but does housework and cooking. Continue amlodipine, atorvastatin, metoprolol. Persistent A-fib.  Onset June 2022. Patient denies cardiac awareness of arrhythmia. EKG shows afib, LBBB rate 90 bpm. Continue metoprolol.  Patient declines Eliquis for stroke prophylaxis. Chronic HFmrEF.  Echo September 2022 showed EF 45 to 50%, global hypokinesis, asymmetric LVH, severe LAE, mild RAE.  Patient denies lower extremity edema. He reports mildly increased DOE with heavier activities such as taking the trash down the driveway to the street. No dyspnea with routine activities.  Euvolemic and well compensated on exam.  Continue metoprolol.  Patient declines Entresto. Discussed getting a repeat echo secondary to new symptoms. He is not interested in pursuing testing at  this time.  Hypertension: BP today 126/80. Patient denies headaches, dizziness or vision changes. Continue amlodipine and metoprolol.  Disposition: Return in 1 year or sooner as needed.          Signed, Etta Grandchild. Brycin Kille, DNP, NP-C

## 2023-06-16 ENCOUNTER — Ambulatory Visit: Payer: Medicare Other | Admitting: Student

## 2023-06-16 ENCOUNTER — Encounter: Payer: Self-pay | Admitting: Student

## 2023-06-16 VITALS — BP 126/80 | HR 90 | Ht 71.0 in | Wt 220.6 lb

## 2023-06-16 DIAGNOSIS — I10 Essential (primary) hypertension: Secondary | ICD-10-CM | POA: Diagnosis present

## 2023-06-16 DIAGNOSIS — I2510 Atherosclerotic heart disease of native coronary artery without angina pectoris: Secondary | ICD-10-CM | POA: Diagnosis not present

## 2023-06-16 DIAGNOSIS — I5022 Chronic systolic (congestive) heart failure: Secondary | ICD-10-CM | POA: Diagnosis present

## 2023-06-16 DIAGNOSIS — I4811 Longstanding persistent atrial fibrillation: Secondary | ICD-10-CM | POA: Insufficient documentation

## 2023-06-16 NOTE — Patient Instructions (Signed)
Medication Instructions:  NO CHANGES IN MEDICATION *If you need a refill on your cardiac medications before your next appointment, please call your pharmacy*   Lab Work: NO LABS ORDERED If you have labs (blood work) drawn today and your tests are completely normal, you will receive your results only by: MyChart Message (if you have MyChart) OR A paper copy in the mail If you have any lab test that is abnormal or we need to change your treatment, we will call you to review the results.   Testing/Procedures: NONE   Follow-Up: At [facility], you and your health needs are our priority.  As part of our continuing mission to provide you with exceptional heart care, we have created designated Provider Care Teams.  These Care Teams include your primary Cardiologist (physician) and Advanced Practice Providers (APPs -  Physician Assistants and Nurse Practitioners) who all work together to provide you with the care you need, when you need it.  We recommend signing up for the patient portal called "MyChart".  Sign up information is provided on this After Visit Summary.  MyChart is used to connect with patients for Virtual Visits (Telemedicine).  Patients are able to view lab/test results, encounter notes, upcoming appointments, etc.  Non-urgent messages can be sent to your provider as well.   To learn more about what you can do with MyChart, go to [email or web address].    Your next appointment:   1 year(s)  Provider:   Peter Swaziland, MD

## 2023-08-16 ENCOUNTER — Other Ambulatory Visit: Payer: Self-pay | Admitting: Internal Medicine

## 2023-08-18 ENCOUNTER — Ambulatory Visit: Payer: Medicare Other | Admitting: Internal Medicine

## 2023-08-18 ENCOUNTER — Encounter: Payer: Self-pay | Admitting: Internal Medicine

## 2023-08-18 VITALS — BP 110/70 | HR 81 | Temp 98.2°F | Ht 71.0 in | Wt 218.2 lb

## 2023-08-18 DIAGNOSIS — I509 Heart failure, unspecified: Secondary | ICD-10-CM | POA: Diagnosis not present

## 2023-08-18 DIAGNOSIS — R739 Hyperglycemia, unspecified: Secondary | ICD-10-CM

## 2023-08-18 DIAGNOSIS — D696 Thrombocytopenia, unspecified: Secondary | ICD-10-CM | POA: Diagnosis not present

## 2023-08-18 DIAGNOSIS — I480 Paroxysmal atrial fibrillation: Secondary | ICD-10-CM

## 2023-08-18 DIAGNOSIS — E875 Hyperkalemia: Secondary | ICD-10-CM

## 2023-08-18 LAB — CBC WITH DIFFERENTIAL/PLATELET
Basophils Absolute: 0.1 10*3/uL (ref 0.0–0.1)
Basophils Relative: 1.1 % (ref 0.0–3.0)
Eosinophils Absolute: 0.1 10*3/uL (ref 0.0–0.7)
Eosinophils Relative: 1.1 % (ref 0.0–5.0)
HCT: 43.9 % (ref 39.0–52.0)
Hemoglobin: 14 g/dL (ref 13.0–17.0)
Lymphocytes Relative: 27.3 % (ref 12.0–46.0)
Lymphs Abs: 2.1 10*3/uL (ref 0.7–4.0)
MCHC: 32 g/dL (ref 30.0–36.0)
MCV: 99.7 fL (ref 78.0–100.0)
Monocytes Absolute: 0.8 10*3/uL (ref 0.1–1.0)
Monocytes Relative: 9.8 % (ref 3.0–12.0)
Neutro Abs: 4.7 10*3/uL (ref 1.4–7.7)
Neutrophils Relative %: 60.7 % (ref 43.0–77.0)
Platelets: 173 10*3/uL (ref 150.0–400.0)
RBC: 4.41 Mil/uL (ref 4.22–5.81)
RDW: 14.1 % (ref 11.5–15.5)
WBC: 7.7 10*3/uL (ref 4.0–10.5)

## 2023-08-18 LAB — COMPREHENSIVE METABOLIC PANEL
ALT: 21 U/L (ref 0–53)
AST: 19 U/L (ref 0–37)
Albumin: 4.1 g/dL (ref 3.5–5.2)
Alkaline Phosphatase: 54 U/L (ref 39–117)
BUN: 10 mg/dL (ref 6–23)
CO2: 27 meq/L (ref 19–32)
Calcium: 9.2 mg/dL (ref 8.4–10.5)
Chloride: 104 meq/L (ref 96–112)
Creatinine, Ser: 0.76 mg/dL (ref 0.40–1.50)
GFR: 85.51 mL/min (ref >60.00)
Glucose, Bld: 109 mg/dL — ABNORMAL HIGH (ref 70–99)
Potassium: 4.2 meq/L (ref 3.5–5.1)
Sodium: 138 meq/L (ref 135–145)
Total Bilirubin: 1 mg/dL (ref 0.2–1.2)
Total Protein: 6.6 g/dL (ref 6.0–8.3)

## 2023-08-18 LAB — SEDIMENTATION RATE: Sed Rate: 8 mm/h (ref 0–20)

## 2023-08-18 LAB — HEMOGLOBIN A1C: Hgb A1c MFr Bld: 5.5 % (ref 4.6–6.5)

## 2023-08-18 MED ORDER — ASPIRIN 81 MG PO TBEC: 81.0000 mg | DELAYED_RELEASE_TABLET | Freq: Every day | ORAL | Status: DC

## 2023-08-18 MED ORDER — PREDNISONE 1 MG PO TABS: ORAL_TABLET | ORAL | 5 refills | Status: DC

## 2023-08-18 NOTE — Progress Notes (Signed)
Subjective:  Patient ID: Dustin Clayton, male    DOB: [birth year]  Age: 77 y.o. MRN: [id number]  CC: Follow-up (4 mnth f/u)   HPI Dustin Clayton presents for PMR, OA, HTN. Doing well    Outpatient Medications Prior to Visit  Medication Sig Dispense Refill   amLODipine (NORVASC) 2.5 MG tablet Take 1 tablet (2.5 mg total) by mouth daily. Take 2.5 mg by mouth daily. 90 tablet 3   atorvastatin (LIPITOR) 20 MG tablet Take 1 tablet (20 mg total) by mouth daily. 90 tablet 0   B Complex-C (B-COMPLEX WITH VITAMIN C) tablet Take 1 tablet by mouth daily. 100 tablet 3   Cholecalciferol (VITAMIN D3) 50 MCG (2000 UT) CAPS Take 1 capsule (2,000 Units total) by mouth daily. 100 capsule 3   Coenzyme Q10 (CO Q 10 PO) Take 1 capsule by mouth daily.     cyclobenzaprine (FLEXERIL) 5 MG tablet Take 1 tablet (5 mg total) by mouth 3 (three) times daily as needed for muscle spasms. 30 tablet 0   dorzolamide-timolol (COSOPT) 22.3-6.8 MG/ML ophthalmic solution Place 1 drop into the left eye 2 (two) times daily.     metoprolol tartrate (LOPRESSOR) 25 MG tablet TAKE 1 TABLET(25 MG) BY MOUTH DAILY 90 tablet 3   Multiple Vitamins-Minerals (MULTI FOR HIM) TABS Orally daily     predniSONE (DELTASONE) 1 MG tablet Taper down Prednisone monthly - 2 mg/d qod and 1 mg/d qod pc or as directed 120 tablet 5   No facility-administered medications prior to visit.    ROS: Review of Systems  Constitutional:  Negative for appetite change, fatigue and unexpected weight change.  HENT:  Negative for congestion, nosebleeds, sneezing, sore throat and trouble swallowing.   Eyes:  Negative for itching and visual disturbance.  Respiratory:  Negative for cough.   Cardiovascular:  Negative for chest pain, palpitations and leg swelling.  Gastrointestinal:  Negative for abdominal distention, blood in stool, diarrhea and nausea.  Genitourinary:  Negative for frequency and hematuria.  Musculoskeletal:  Negative for back pain, gait  problem, joint swelling and neck pain.  Skin:  Negative for rash.  Neurological:  Negative for dizziness, tremors, speech difficulty and weakness.  Psychiatric/Behavioral:  Negative for agitation, dysphoric mood and sleep disturbance. The patient is not nervous/anxious.     Objective:  BP 110/70 (BP Location: Right Arm, Patient Position: Sitting, Cuff Size: Normal)   Pulse 81   Temp 98.2 F (36.8 C) (Oral)   Ht 5\' 11"  (1.803 m)   Wt 218 lb 4 oz (99 kg)   SpO2 97%   BMI 30.44 kg/m   BP Readings from Last 3 Encounters:  08/18/23 110/70  06/16/23 126/80  04/21/23 110/70    Wt Readings from Last 3 Encounters:  08/18/23 218 lb 4 oz (99 kg)  06/16/23 220 lb 9.6 oz (100.1 kg)  04/21/23 220 lb (99.8 kg)    Physical Exam Constitutional:      General: He is not in acute distress.    Appearance: He is well-developed.     Comments: NAD  Eyes:     Conjunctiva/sclera: Conjunctivae normal.     Pupils: Pupils are equal, round, and reactive to light.  Neck:     Thyroid: No thyromegaly.     Vascular: No JVD.  Cardiovascular:     Rate and Rhythm: Normal rate and regular rhythm.     Heart sounds: Normal heart sounds. No murmur heard.    No friction rub. No gallop.  Pulmonary:  Effort: Pulmonary effort is normal. No respiratory distress.     Breath sounds: Normal breath sounds. No wheezing or rales.  Chest:     Chest wall: No tenderness.  Abdominal:     General: Bowel sounds are normal. There is no distension.     Palpations: Abdomen is soft. There is no mass.     Tenderness: There is no abdominal tenderness. There is no guarding or rebound.  Musculoskeletal:        General: No tenderness. Normal range of motion.     Cervical back: Normal range of motion.  Lymphadenopathy:     Cervical: No cervical adenopathy.  Skin:    General: Skin is warm and dry.     Findings: No rash.  Neurological:     Mental Status: He is alert and oriented to person, place, and time.     Cranial  Nerves: No cranial nerve deficit.     Motor: No abnormal muscle tone.     Coordination: Coordination normal.     Gait: Gait normal.     Deep Tendon Reflexes: Reflexes are normal and symmetric.  Psychiatric:        Behavior: Behavior normal.        Thought Content: Thought content normal.        Judgment: Judgment normal.     Lab Results  Component Value Date   WBC 7.0 04/21/2023   HGB 14.4 04/21/2023   HCT 44.5 04/21/2023   PLT 162.0 04/21/2023   GLUCOSE 97 04/21/2023   CHOL 180 07/30/2022   TRIG 141.0 07/30/2022   HDL 85.80 07/30/2022   LDLCALC 66 07/30/2022   ALT 12 04/21/2023   AST 15 04/21/2023   NA 143 04/21/2023   K 6.2 No hemolysis seen (HH) 04/21/2023   CL 106 04/21/2023   CREATININE 0.89 04/21/2023   BUN 13 04/21/2023   CO2 28 04/21/2023   TSH 4.73 08/12/2021   PSA 1.22 04/21/2023   INR 1.00 07/20/2017   HGBA1C 5.5 04/21/2023    ECHOCARDIOGRAM COMPLETE  Result Date: 07/21/2017                            *[place]*                   *[facility]*                         [address]                        [place], [place] [address]                            [phone] ------------------------------------------------------------------- Transthoracic Echocardiography Patient:    Dustin Clayton MR #:       [id number] Study Date: 07/21/2017 Gender:     M Age:        2 Height:     180.3 cm Weight:     101.7 kg BSA:        2.28 m^2 Pt. 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 ------------------------------------------------------------------- History:   PMH:    Myocardial infarction.  Risk factors: Hypertension. ------------------------------------------------------------------- Study Conclusions - Left ventricle: The cavity size was normal. There was moderate   concentric hypertrophy. Systolic function was normal. The   estimated ejection fraction was in the range of 55% to 60%. Wall   motion was normal; there were no regional wall motion   abnormalities. Doppler parameters are consistent with abnormal   left ventricular relaxation (grade 1 diastolic dysfunction). - Left atrium: The atrium was mildly dilated. - Right ventricle: The cavity size was mildly dilated. - Atrial septum: No defect or patent foramen ovale was identified. ------------------------------------------------------------------- Study data:  No prior study was available for comparison.  Study status:  Routine.  Procedure:  Transthoracic echocardiography. Image quality was adequate.  Study completion:  There were no complications.          Transthoracic echocardiography.  M-mode, complete 2D, spectral Doppler, and color Doppler.  Birthdate: Patient birthdate: 1946/07/12.  Age:  Patient is 77 yr old.  Sex: Gender: male.    BMI: 31.3 kg/m^2.  Blood pressure:     127/69 Patient status:  Inpatient.  Study date:  Study date: 07/21/2017. Study time: 10:52 AM.  Location:  Bedside. ------------------------------------------------------------------- ------------------------------------------------------------------- Left ventricle:  The cavity size was normal. There was moderate concentric hypertrophy. Systolic function was normal. The estimated ejection fraction was in the range of 55% to 60%. Wall motion was normal; there were no regional wall motion abnormalities. Doppler parameters are consistent with abnormal left ventricular relaxation (grade 1 diastolic dysfunction). There was no evidence of elevated ventricular filling pressure by Doppler parameters.  ------------------------------------------------------------------- Aortic valve:   Structurally normal valve. Trileaflet. Cusp separation was normal.  Doppler:  Transvalvular velocity was within the normal range. There was no stenosis. There was no regurgitation. ------------------------------------------------------------------- Aorta:  Aortic root: The aortic root was normal in size. Ascending aorta: The ascending aorta was normal in size. ------------------------------------------------------------------- Mitral valve:   Structurally normal valve.   Leaflet separation was normal.  Doppler:  Transvalvular velocity was within the normal range. There was no evidence for stenosis. There was no regurgitation. ------------------------------------------------------------------- Left atrium:  The atrium was mildly dilated. ------------------------------------------------------------------- Atrial septum:  No defect or patent foramen ovale was identified.  ------------------------------------------------------------------- Right ventricle:  The cavity size was mildly dilated. Systolic function was normal. ------------------------------------------------------------------- Pulmonic valve:    Structurally normal valve.   Cusp separation was normal.  Doppler:  Transvalvular velocity was within the normal range. There was trivial regurgitation. ------------------------------------------------------------------- Tricuspid valve:   Structurally normal valve.   Leaflet separation was normal.  Doppler:  Transvalvular velocity was within the normal range. There was trivial regurgitation. ------------------------------------------------------------------- Pulmonary artery:   Systolic pressure was within the normal range.  ------------------------------------------------------------------- Right atrium:  The atrium was normal in size. ------------------------------------------------------------------- Pericardium:  There was no  pericardial effusion. ------------------------------------------------------------------- Systemic veins: Inferior vena cava: The vessel was normal in size. The respirophasic diameter changes were in the normal range (= 50%), consistent with normal central venous pressure. ------------------------------------------------------------------- Measurements  Left ventricle                          Value        Reference  LV ID, ED, PLAX chordal                 52  mm     43 - 52  LV ID, ES, PLAX chordal                 30    mm     23 - 38  LV fx shortening, PLAX chordal          42    %      >=29  LV PW thickness, ED                     15    mm     ----------  IVS/LV PW ratio, ED                     1            <=1.3  Stroke volume, 2D                       74    ml     ----------  Stroke volume/bsa, 2D                   32    ml/m^2 ----------  LV e&', lateral                          6.53  cm/s   ----------  LV e&', medial                           4.9   cm/s   ----------  LV e&', average                          5.72  cm/s   ----------   Ventricular septum                      Value        Reference  IVS thickness, ED                       15    mm     ----------   LVOT                                    Value        Reference  LVOT ID, S                              20    mm     ----------  LVOT area                               3.14  cm^2   ----------  LVOT peak velocity, S                   121   cm/s   ----------  LVOT mean velocity, S                   80.7  cm/s   ----------  LVOT VTI, S  23.6  cm     ----------  LVOT peak gradient, S                   6     mm Hg  ----------   Aorta                                   Value        Reference  Aortic root ID, ED                      34    mm     ----------   Left atrium                             Value        Reference  LA ID, A-P, ES                          40    mm     ----------  LA ID/bsa, A-P                          1.75   cm/m^2 <=2.2  LA volume, S                            60    ml     ----------  LA volume/bsa, S                        26.3  ml/m^2 ----------  LA volume, ES, 1-p A4C                  65.2  ml     ----------  LA volume/bsa, ES, 1-p A4C              28.5  ml/m^2 ----------  LA volume, ES, 1-p A2C                  48.9  ml     ----------  LA volume/bsa, ES, 1-p A2C              21.4  ml/m^2 ----------   Mitral valve                            Value        Reference  Mitral deceleration time        (H)     289   ms     150 - 230  Mitral E/A ratio, peak                  0.7          ----------   Pulmonary arteries                      Value        Reference  PA pressure, S, DP                      27    mm Hg  <=30   Tricuspid valve  Value        Reference  Tricuspid regurg peak velocity          247   cm/s   ----------  Tricuspid peak RV-RA gradient           24    mm Hg  ----------   Right atrium                            Value        Reference  RA ID, S-I, ES, A4C             (H)     60.3  mm     34 - 49  RA area, ES, A4C                (H)     21.6  cm^2   8.3 - 19.5  RA volume, ES, A/L                      65.2  ml     ----------  RA volume/bsa, ES, A/L                  28.5  ml/m^2 ----------   Systemic veins                          Value        Reference  Estimated CVP                           3     mm Hg  ----------   Right ventricle                         Value        Reference  RV ID, minor axis, ED, A4C              42.95 mm     26 - 43  RV ID, minor axis, ED, A4C base         56    mm     ----------  TAPSE                                   23.1  mm     ----------  RV pressure, S, DP                      27    mm Hg  <=30  RV s&', lateral, S                       15.9  cm/s   ---------- Legend: (L)  and  (H)  mark values outside specified reference range. ------------------------------------------------------------------- Prepared and Electronically Authenticated by Thurmon Fair,  MD 2018-09-18T13:09:48  US Abdomen Complete  Result Date: 07/21/2017 CLINICAL DATA:  Elevated liver function tests. EXAM: ABDOMEN ULTRASOUND COMPLETE COMPARISON:  None. FINDINGS: Gallbladder: Multiple gallstones are noted with the largest measuring 1.3 cm. No significant gallbladder wall thickening or pericholecystic fluid is noted. Sludge is noted as well. No sonographic Murphy's sign is noted. Common bile duct: Diameter: 5.9 mm which is within normal limits. Liver: 1.1 cm simple cyst is noted in left hepatic lobe. Mildly  increased echogenicity of hepatic parenchyma is noted suggesting fatty infiltration or hepatocellular disease. Portal vein is patent on color Doppler imaging with normal direction of blood flow towards the liver. IVC: No abnormality visualized. Pancreas: Visualized portion unremarkable. Spleen: Size and appearance within normal limits. Right Kidney: Length: 12.3 cm. Echogenicity within normal limits. No mass or hydronephrosis visualized. Left Kidney: Length: 13.1 cm. Echogenicity within normal limits. No mass or hydronephrosis visualized. Abdominal aorta: No aneurysm visualized. Other findings: None. IMPRESSION: Cholelithiasis without definite evidence of cholecystitis. Mildly increased echogenicity of hepatic parenchyma is noted suggesting fatty infiltration or diffuse hepatocellular disease. Small left hepatic cyst is noted. Electronically Signed   By: Lupita Raider, M.D.   On: 07/21/2017 09:54   CT ABDOMEN PELVIS W CONTRAST  Result Date: 07/21/2017 CLINICAL DATA:  Diffuse abdominal pain and distention for several days. Elevated liver function tests. Fever. EXAM: CT ABDOMEN AND PELVIS WITH CONTRAST TECHNIQUE: Multidetector CT imaging of the abdomen and pelvis was performed using the standard protocol following bolus administration of intravenous contrast. CONTRAST:  ISOVUE-300 IOPAMIDOL (ISOVUE-300) INJECTION 61% COMPARISON:  07/21/2017 abdominal sonogram. FINDINGS: Lower chest:  Mild patchy tree-in-bud opacities in the dependent right lung base. Subsegmental atelectasis in the dependent lung bases. Hepatobiliary: Normal liver size. A few simple lateral segment left liver lobe cysts, largest 2.0 cm. Several subcentimeter hypodense lesions scattered throughout the liver, too small to characterize, for which no follow-up is required unless the patient has risk factors for liver malignancy. Nondistended gallbladder contains multiple calcified gallstones measuring up to 1.8 cm. No definite gallbladder wall thickening or pericholecystic fluid. No significant intrahepatic biliary ductal dilatation. Common bile duct diameter 6 mm, top-normal. There is a 4 mm calcified stone in the lower third of the common bile duct near the ampulla. There is mild haziness of the fat surrounding the common bile duct. Pancreas: Normal, with no mass or duct dilation. Spleen: Normal size. No mass. Adrenals/Urinary Tract: Normal adrenals. Normal kidneys with no hydronephrosis and no renal mass. Normal bladder. Stomach/Bowel: Small hiatal hernia. Otherwise nondistended and grossly normal stomach. Normal caliber small bowel with no small bowel wall thickening. Appendectomy. Moderate diffuse colonic diverticulosis, most prominent in the sigmoid colon, with no large bowel wall thickening or pericolonic fat stranding. 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 Patent portal, splenic, hepatic and renal veins. No pathologically enlarged lymph nodes in the abdomen or pelvis. Reproductive: Normal size prostate with nonspecific internal prostatic calcifications. Other: No pneumoperitoneum, ascites or focal fluid collection. Musculoskeletal: No aggressive appearing focal osseous lesions. Moderate thoracolumbar spondylosis, most prominent at L5-S1. IMPRESSION: 1. Cholelithiasis.  No CT findings of acute cholecystitis. 2. Solitary 4 mm choledocholith in the lower third of  the common bile duct near the ampulla. CBD diameter 6 mm, top-normal. No intrahepatic biliary ductal dilatation. 3. Haziness of the fat surrounding the common bile duct, cannot exclude ascending cholangitis. 4. Mild patchy tree-in-bud opacities at the dependent right lung base, compatible with mild aspiration or infectious bronchiolitis. 5. 3.1 cm Abdominal Aortic Aneurysm (ICD10-I71.9). Recommend follow-up aortic ultrasound in 3 years. This recommendation follows ACR consensus guidelines: White Paper of the ACR Incidental Findings Committee II on Vascular Findings. J Am Coll Radiol 2013; 10:789-794. 6.  Aortic Atherosclerosis (ICD10-I70.0). 7. Small hiatal hernia. 8. Moderate diffuse colonic diverticulosis. Electronically Signed   By: Delbert Phenix M.D.   On: 07/21/2017 09:48   DG CHEST PORT 1 VIEW  Result Date: 07/21/2017 CLINICAL DATA:  Fevers EXAM: PORTABLE  CHEST 1 VIEW COMPARISON:  01/06/2013 FINDINGS: The heart size and mediastinal contours are within normal limits. Both lungs are clear. The visualized skeletal structures are unremarkable. IMPRESSION: No active disease. Electronically Signed   By: Alcide Clever M.D.   On: 07/21/2017 07:47   CARDIAC CATHETERIZATION  Result Date: 07/20/2017  Mid Cx to Dist Cx lesion, 100 %stenosed.  Mid RCA lesion, 20 %stenosed.  Prox Cx lesion, 20 %stenosed.  Ost 1st Diag to 1st Diag lesion, 60 %stenosed.  Prox LAD lesion, 20 %stenosed.  The left ventricular systolic function is normal.  LV end diastolic pressure is normal.  The left ventricular ejection fraction is greater than 65% by visual estimate.  There is no mitral valve regurgitation.  1. There is right to left filling of a small lateral wall branch. This appears to be a total occlusion of the distal Circumflex where the vessel becomes very small in caliber but cannot totally exclude this being a small Diagonal branch. The distal AV groove Circumflex changes quickly to a small caliber vessel with a  lateral branch. This could the site of total occlusion. The vascular territory supplied by this branch is very small. 2. Moderate stenosis in the ostium of the large first Diagonal branch with excellent flow down the vessel. This vessel represents a dual LAD. This lesion does not appear to be ulcerated and doe not appear to be flow limiting. 3. Mild stenosis in the proximal LAD. The mid LAD is tortuous and there is subtle dye staining in this bend with quick dye washout. The LAD continues to the apex. 4. The RCA is a large dominant vessel with mild plaque. The distal right gives off a collateral to a very small lateral wall vessel. 5. Normal LV systolic function 6. 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 As above, LBBB induced with the placement of the pigtail catheter in the LV. Echo in am. It is unclear if his presentation is due to the occlusion of the small lateral wall branch. He is at the completion of the cath not having any symptoms.    Assessment & Plan:   Problem List Items Addressed This Visit     Thrombocytopenia (HCC)    Monitoring CBC       Congestive heart failure (CHF) (HCC) - Primary    Not taking Entresto, Eliquis On Amlodipine, Toprol       Relevant Medications   aspirin EC 81 MG tablet   Other Relevant Orders   CBC with Differential/Platelet   Comprehensive metabolic panel   Hemoglobin A1c   Sedimentation rate   Atrial fibrillation (HCC)    On Amlodipine, Toprol Off Eliquis Start Baby ASA      Relevant Medications   aspirin EC 81 MG tablet   Other Relevant Orders   CBC with Differential/Platelet   Comprehensive metabolic panel   Hemoglobin A1c   Sedimentation rate   Hyperglycemia    Check A1c      Relevant Orders   CBC with Differential/Platelet   Comprehensive metabolic panel   Hemoglobin A1c   Sedimentation rate      Meds ordered this  encounter  Medications   predniSONE (DELTASONE) 1 MG tablet    Sig: Taper down Prednisone monthly - 3 mg/d pc or as dirrected    Dispense:  100 tablet    Refill:  5   aspirin EC 81 MG tablet  Sig: Take 1 tablet (81 mg total) by mouth daily.      Follow-up: Return in about 3 months (around 11/18/2023) for a follow-up visit.  Sonda Primes, MD

## 2023-08-18 NOTE — Assessment & Plan Note (Signed)
Monitoring CBC

## 2023-08-18 NOTE — Assessment & Plan Note (Signed)
Not taking Entresto, Eliquis On Amlodipine, Toprol

## 2023-08-18 NOTE — Assessment & Plan Note (Signed)
Check A1c 

## 2023-08-18 NOTE — Assessment & Plan Note (Signed)
On Amlodipine, Toprol Off Eliquis Start Baby ASA

## 2023-10-28 ENCOUNTER — Other Ambulatory Visit: Payer: Self-pay | Admitting: Internal Medicine

## 2023-11-18 ENCOUNTER — Ambulatory Visit: Payer: Medicare Other | Admitting: Internal Medicine

## 2023-11-18 VITALS — BP 104/60 | HR 92 | Temp 97.8°F | Ht 71.0 in | Wt 205.0 lb

## 2023-11-18 DIAGNOSIS — M353 Polymyalgia rheumatica: Secondary | ICD-10-CM

## 2023-11-18 DIAGNOSIS — I10 Essential (primary) hypertension: Secondary | ICD-10-CM

## 2023-11-18 DIAGNOSIS — D696 Thrombocytopenia, unspecified: Secondary | ICD-10-CM

## 2023-11-18 DIAGNOSIS — R739 Hyperglycemia, unspecified: Secondary | ICD-10-CM | POA: Diagnosis not present

## 2023-11-18 LAB — COMPREHENSIVE METABOLIC PANEL
ALT: 13 U/L (ref 0–53)
AST: 16 U/L (ref 0–37)
Albumin: 4.3 g/dL (ref 3.5–5.2)
Alkaline Phosphatase: 57 U/L (ref 39–117)
BUN: 11 mg/dL (ref 6–23)
CO2: 27 meq/L (ref 19–32)
Calcium: 9.4 mg/dL (ref 8.4–10.5)
Chloride: 103 meq/L (ref 96–112)
Creatinine, Ser: 0.78 mg/dL (ref 0.40–1.50)
GFR: 84.7 mL/min (ref >60.00)
Glucose, Bld: 106 mg/dL — ABNORMAL HIGH (ref 70–99)
Potassium: 4.8 meq/L (ref 3.5–5.1)
Sodium: 138 meq/L (ref 135–145)
Total Bilirubin: 1 mg/dL (ref 0.2–1.2)
Total Protein: 6.5 g/dL (ref 6.0–8.3)

## 2023-11-18 LAB — HEMOGLOBIN A1C: Hgb A1c MFr Bld: 5.8 % (ref 4.6–6.5)

## 2023-11-18 LAB — T4, FREE: Free T4: 0.83 ng/dL (ref 0.60–1.60)

## 2023-11-18 LAB — TSH: TSH: 4.2 u[IU]/mL (ref 0.35–5.50)

## 2023-11-18 LAB — SEDIMENTATION RATE: Sed Rate: 6 mm/h (ref 0–20)

## 2023-11-18 MED ORDER — PREDNISONE 1 MG PO TABS: 3.0000 mg | ORAL_TABLET | Freq: Every day | ORAL | 5 refills | Status: DC

## 2023-11-18 MED ORDER — METOPROLOL TARTRATE 25 MG PO TABS: 12.5000 mg | ORAL_TABLET | Freq: Every day | ORAL | Status: DC

## 2023-11-18 NOTE — Assessment & Plan Note (Signed)
Pt lost wt A1c

## 2023-11-18 NOTE — Assessment & Plan Note (Signed)
Occasional stiffness On Deltasone 2.5 mg/d now Start Deltasone 3 mg qod and 2 mg qod Start B complex, Vit D

## 2023-11-18 NOTE — Patient Instructions (Signed)
 Dustin Clayton

## 2023-11-18 NOTE — Assessment & Plan Note (Signed)
-   Continue metoprolol 

## 2023-11-18 NOTE — Assessment & Plan Note (Signed)
 Resolved

## 2023-11-18 NOTE — Progress Notes (Signed)
 Subjective:  Patient ID: Dustin Clayton, male    DOB: [birth year]  Age: 78 y.o. MRN: [id number]  CC: Medical Management of Chronic Issues (3 MNTH F/U)   HPI Dustin Clayton presents for PMR Taking 2.5  mg Prednisone   Outpatient Medications Prior to Visit  Medication Sig Dispense Refill   amLODipine  (NORVASC ) 2.5 MG tablet TAKE 1 TABLET(2.5 MG) BY MOUTH DAILY 90 tablet 3   aspirin  EC 81 MG tablet Take 1 tablet (81 mg total) by mouth daily.     atorvastatin  (LIPITOR ) 20 MG tablet Take 1 tablet (20 mg total) by mouth daily. 90 tablet 0   B Complex-C (B-COMPLEX WITH VITAMIN C) tablet Take 1 tablet by mouth daily. 100 tablet 3   Cholecalciferol (VITAMIN D3) 50 MCG (2000 UT) CAPS Take 1 capsule (2,000 Units total) by mouth daily. 100 capsule 3   Coenzyme Q10 (CO Q 10 PO) Take 1 capsule by mouth daily.     cyclobenzaprine  (FLEXERIL ) 5 MG tablet Take 1 tablet (5 mg total) by mouth 3 (three) times daily as needed for muscle spasms. 30 tablet 0   dorzolamide-timolol (COSOPT) 22.3-6.8 MG/ML ophthalmic solution Place 1 drop into the left eye 2 (two) times daily.     metoprolol  tartrate (LOPRESSOR ) 25 MG tablet TAKE 1 TABLET(25 MG) BY MOUTH DAILY 90 tablet 3   Multiple Vitamins-Minerals (MULTI FOR HIM) TABS Orally daily     predniSONE  (DELTASONE ) 1 MG tablet Taper down Prednisone  monthly - 3 mg/d pc or as dirrected 100 tablet 5   No facility-administered medications prior to visit.    ROS: Review of Systems  Constitutional:  Negative for appetite change, fatigue and unexpected weight change.  HENT:  Negative for congestion, nosebleeds, sneezing, sore throat and trouble swallowing.   Eyes:  Negative for itching and visual disturbance.  Respiratory:  Negative for cough.   Cardiovascular:  Negative for chest pain, palpitations and leg swelling.  Gastrointestinal:  Negative for abdominal distention, blood in stool, diarrhea and nausea.  Genitourinary:  Negative for frequency and hematuria.   Musculoskeletal:  Positive for arthralgias. Negative for back pain, gait problem, joint swelling and neck pain.  Skin:  Negative for rash.  Neurological:  Negative for dizziness, tremors, speech difficulty and weakness.  Psychiatric/Behavioral:  Negative for agitation, dysphoric mood, sleep disturbance and suicidal ideas. The patient is not nervous/anxious.     Objective:  BP 104/60 (BP Location: Left Arm, Patient Position: Sitting, Cuff Size: Normal)   Pulse 92   Temp 97.8 F (36.6 C) (Oral)   Ht 5\' 11"  (1.803 m)   Wt 205 lb (93 kg)   SpO2 95%   BMI 28.59 kg/m   BP Readings from Last 3 Encounters:  11/18/23 104/60  08/18/23 110/70  06/16/23 126/80    Wt Readings from Last 3 Encounters:  11/18/23 205 lb (93 kg)  08/18/23 218 lb 4 oz (99 kg)  06/16/23 220 lb 9.6 oz (100.1 kg)    Physical Exam Constitutional:      General: He is not in acute distress.    Appearance: He is well-developed. He is obese.     Comments: NAD  Eyes:     Conjunctiva/sclera: Conjunctivae normal.     Pupils: Pupils are equal, round, and reactive to light.  Neck:     Thyroid : No thyromegaly.     Vascular: No JVD.  Cardiovascular:     Rate and Rhythm: Normal rate and regular rhythm.     Heart sounds: Normal heart sounds.  No murmur heard.    No friction rub. No gallop.  Pulmonary:     Effort: Pulmonary effort is normal. No respiratory distress.     Breath sounds: Normal breath sounds. No wheezing or rales.  Chest:     Chest wall: No tenderness.  Abdominal:     General: Bowel sounds are normal. There is no distension.     Palpations: Abdomen is soft. There is no mass.     Tenderness: There is no abdominal tenderness. There is no guarding or rebound.  Musculoskeletal:        General: No tenderness. Normal range of motion.     Cervical back: Normal range of motion.  Lymphadenopathy:     Cervical: No cervical adenopathy.  Skin:    General: Skin is warm and dry.     Findings: No rash.   Neurological:     Mental Status: He is alert and oriented to person, place, and time.     Cranial Nerves: No cranial nerve deficit.     Motor: No abnormal muscle tone.     Coordination: Coordination normal.     Gait: Gait normal.     Deep Tendon Reflexes: Reflexes are normal and symmetric.  Psychiatric:        Behavior: Behavior normal.        Thought Content: Thought content normal.        Judgment: Judgment normal.     Lab Results  Component Value Date   WBC 7.7 08/18/2023   HGB 14.0 08/18/2023   HCT 43.9 08/18/2023   PLT 173.0 08/18/2023   GLUCOSE 109 (H) 08/18/2023   CHOL 180 07/30/2022   TRIG 141.0 07/30/2022   HDL 85.80 07/30/2022   LDLCALC 66 07/30/2022   ALT 21 08/18/2023   AST 19 08/18/2023   NA 138 08/18/2023   K 4.2 08/18/2023   CL 104 08/18/2023   CREATININE 0.76 08/18/2023   BUN 10 08/18/2023   CO2 27 08/18/2023   TSH 4.73 08/12/2021   PSA 1.22 04/21/2023   INR 1.00 07/20/2017   HGBA1C 5.5 08/18/2023    ECHOCARDIOGRAM COMPLETE Result Date: 07/21/2017                            *[place]*                   *[facility]*                         [address]                        [place], [place] [address]                            [phone] ------------------------------------------------------------------- Transthoracic Echocardiography Patient:    Dustin Clayton, Dustin Clayton MR #:       [id number] Study Date: 07/21/2017 Gender:     M Age:        7 Height:     180.3 cm Weight:     101.7 kg BSA:        2.28 m^2 Pt. 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  ------------------------------------------------------------------- History:   PMH:   Myocardial infarction.  Risk factors: Hypertension. ------------------------------------------------------------------- Study Conclusions - Left ventricle: The cavity size was normal. There was moderate   concentric hypertrophy. Systolic function was normal. The   estimated ejection fraction was in the range of 55% to 60%. Wall   motion was normal; there were no regional wall motion   abnormalities. Doppler parameters are consistent with abnormal   left ventricular relaxation (grade 1 diastolic dysfunction). - Left atrium: The atrium was mildly dilated. - Right ventricle: The cavity size was mildly dilated. - Atrial septum: No defect or patent foramen ovale was identified. ------------------------------------------------------------------- Study data:  No prior study was available for comparison.  Study status:  Routine.  Procedure:  Transthoracic echocardiography. Image quality was adequate.  Study completion:  There were no complications.          Transthoracic echocardiography.  M-mode, complete 2D, spectral Doppler, and color Doppler.  Birthdate: Patient birthdate: 05/03/1946.  Age:  Patient is 78 yr old.  Sex: Gender: male.    BMI: 31.3 kg/m^2.  Blood pressure:     127/69 Patient status:  Inpatient.  Study date:  Study date: 07/21/2017. Study time: 10:52 AM.  Location:  Bedside. ------------------------------------------------------------------- ------------------------------------------------------------------- Left ventricle:  The cavity size was normal. There was moderate concentric hypertrophy. Systolic function was normal. The estimated ejection fraction was in the range of 55% to 60%. Wall motion was normal; there were no regional wall motion abnormalities. Doppler parameters are consistent with abnormal left ventricular relaxation (grade 1 diastolic dysfunction). There was no evidence of elevated ventricular filling  pressure by Doppler parameters. ------------------------------------------------------------------- Aortic valve:   Structurally normal valve. Trileaflet. Cusp separation was normal.  Doppler:  Transvalvular velocity was within the normal range. There was no stenosis. There was no regurgitation. ------------------------------------------------------------------- Aorta:  Aortic root: The aortic root was normal in size. Ascending aorta: The ascending aorta was normal in size. ------------------------------------------------------------------- Mitral valve:   Structurally normal valve.   Leaflet separation was normal.  Doppler:  Transvalvular velocity was within the normal range. There was no evidence for stenosis. There was no regurgitation. ------------------------------------------------------------------- Left atrium:  The atrium was mildly dilated. ------------------------------------------------------------------- Atrial septum:  No defect or patent foramen ovale was identified.  ------------------------------------------------------------------- Right ventricle:  The cavity size was mildly dilated. Systolic function was normal. ------------------------------------------------------------------- Pulmonic valve:    Structurally normal valve.   Cusp separation was normal.  Doppler:  Transvalvular velocity was within the normal range. There was trivial regurgitation. ------------------------------------------------------------------- Tricuspid valve:   Structurally normal valve.   Leaflet separation was normal.  Doppler:  Transvalvular velocity was within the normal range. There was trivial regurgitation. ------------------------------------------------------------------- Pulmonary artery:   Systolic pressure was within the normal range.  ------------------------------------------------------------------- Right atrium:  The atrium was normal in size.  ------------------------------------------------------------------- Pericardium:  There was no pericardial effusion. ------------------------------------------------------------------- Systemic veins: Inferior vena cava: The vessel was normal in size. The respirophasic diameter changes were in the normal range (= 50%), consistent with normal central venous pressure. ------------------------------------------------------------------- Measurements  Left ventricle                          Value        Reference  LV ID, ED, PLAX chordal  52    mm     43 - 52  LV ID, ES, PLAX chordal                 30    mm     23 - 38  LV fx shortening, PLAX chordal          42    %      >=29  LV PW thickness, ED                     15    mm     ----------  IVS/LV PW ratio, ED                     1            <=1.3  Stroke volume, 2D                       74    ml     ----------  Stroke volume/bsa, 2D                   32    ml/m^2 ----------  LV e&', lateral                          6.53  cm/s   ----------  LV e&', medial                           4.9   cm/s   ----------  LV e&', average                          5.72  cm/s   ----------   Ventricular septum                      Value        Reference  IVS thickness, ED                       15    mm     ----------   LVOT                                    Value        Reference  LVOT ID, S                              20    mm     ----------  LVOT area                               3.14  cm^2   ----------  LVOT peak velocity, S                   121   cm/s   ----------  LVOT mean velocity, S                   80.7  cm/s   ----------  LVOT VTI, S  23.6  cm     ----------  LVOT peak gradient, S                   6     mm Hg  ----------   Aorta                                   Value        Reference  Aortic root ID, ED                      34    mm     ----------   Left atrium                             Value        Reference  LA ID, A-P, ES                           40    mm     ----------  LA ID/bsa, A-P                          1.75  cm/m^2 <=2.2  LA volume, S                            60    ml     ----------  LA volume/bsa, S                        26.3  ml/m^2 ----------  LA volume, ES, 1-p A4C                  65.2  ml     ----------  LA volume/bsa, ES, 1-p A4C              28.5  ml/m^2 ----------  LA volume, ES, 1-p A2C                  48.9  ml     ----------  LA volume/bsa, ES, 1-p A2C              21.4  ml/m^2 ----------   Mitral valve                            Value        Reference  Mitral deceleration time        (H)     289   ms     150 - 230  Mitral E/A ratio, peak                  0.7          ----------   Pulmonary arteries                      Value        Reference  PA pressure, S, DP                      27    mm Hg  <=30   Tricuspid valve  Value        Reference  Tricuspid regurg peak velocity          247   cm/s   ----------  Tricuspid peak RV-RA gradient           24    mm Hg  ----------   Right atrium                            Value        Reference  RA ID, S-I, ES, A4C             (H)     60.3  mm     34 - 49  RA area, ES, A4C                (H)     21.6  cm^2   8.3 - 19.5  RA volume, ES, A/L                      65.2  ml     ----------  RA volume/bsa, ES, A/L                  28.5  ml/m^2 ----------   Systemic veins                          Value        Reference  Estimated CVP                           3     mm Hg  ----------   Right ventricle                         Value        Reference  RV ID, minor axis, ED, A4C              42.95 mm     26 - 43  RV ID, minor axis, ED, A4C base         56    mm     ----------  TAPSE                                   23.1  mm     ----------  RV pressure, S, DP                      27    mm Hg  <=30  RV s&', lateral, S                       15.9  cm/s   ---------- Legend: (L)  and  (H)  mark values outside specified reference range.  ------------------------------------------------------------------- Prepared and Electronically Authenticated by Luana Rumple, MD 2018-09-18T13:09:48  US  Abdomen Complete Result Date: 07/21/2017 CLINICAL DATA:  Elevated liver function tests. EXAM: ABDOMEN ULTRASOUND COMPLETE COMPARISON:  None. FINDINGS: Gallbladder: Multiple gallstones are noted with the largest measuring 1.3 cm. No significant gallbladder wall thickening or pericholecystic fluid is noted. Sludge is noted as well. No sonographic Murphy's sign is noted. Common bile duct: Diameter: 5.9 mm which is within normal limits. Liver: 1.1 cm simple cyst is noted in left hepatic lobe. Mildly increased  echogenicity of hepatic parenchyma is noted suggesting fatty infiltration or hepatocellular disease. Portal vein is patent on color Doppler imaging with normal direction of blood flow towards the liver. IVC: No abnormality visualized. Pancreas: Visualized portion unremarkable. Spleen: Size and appearance within normal limits. Right Kidney: Length: 12.3 cm. Echogenicity within normal limits. No mass or hydronephrosis visualized. Left Kidney: Length: 13.1 cm. Echogenicity within normal limits. No mass or hydronephrosis visualized. Abdominal aorta: No aneurysm visualized. Other findings: None. IMPRESSION: Cholelithiasis without definite evidence of cholecystitis. Mildly increased echogenicity of hepatic parenchyma is noted suggesting fatty infiltration or diffuse hepatocellular disease. Small left hepatic cyst is noted. Electronically Signed   By: Rosalene Colon, M.D.   On: 07/21/2017 09:54   CT ABDOMEN PELVIS W CONTRAST Result Date: 07/21/2017 CLINICAL DATA:  Diffuse abdominal pain and distention for several days. Elevated liver function tests. Fever. EXAM: CT ABDOMEN AND PELVIS WITH CONTRAST TECHNIQUE: Multidetector CT imaging of the abdomen and pelvis was performed using the standard protocol following bolus administration of intravenous contrast.  CONTRAST:  ISOVUE -300 IOPAMIDOL  (ISOVUE -300) INJECTION 61% COMPARISON:  07/21/2017 abdominal sonogram. FINDINGS: Lower chest: Mild patchy tree-in-bud opacities in the dependent right lung base. Subsegmental atelectasis in the dependent lung bases. Hepatobiliary: Normal liver size. A few simple lateral segment left liver lobe cysts, largest 2.0 cm. Several subcentimeter hypodense lesions scattered throughout the liver, too small to characterize, for which no follow-up is required unless the patient has risk factors for liver malignancy. Nondistended gallbladder contains multiple calcified gallstones measuring up to 1.8 cm. No definite gallbladder wall thickening or pericholecystic fluid. No significant intrahepatic biliary ductal dilatation. Common bile duct diameter 6 mm, top-normal. There is a 4 mm calcified stone in the lower third of the common bile duct near the ampulla. There is mild haziness of the fat surrounding the common bile duct. Pancreas: Normal, with no mass or duct dilation. Spleen: Normal size. No mass. Adrenals/Urinary Tract: Normal adrenals. Normal kidneys with no hydronephrosis and no renal mass. Normal bladder. Stomach/Bowel: Small hiatal hernia. Otherwise nondistended and grossly normal stomach. Normal caliber small bowel with no small bowel wall thickening. Appendectomy. Moderate diffuse colonic diverticulosis, most prominent in the sigmoid colon, with no large bowel wall thickening or pericolonic fat stranding. 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 Patent portal, splenic, hepatic and renal veins. No pathologically enlarged lymph nodes in the abdomen or pelvis. Reproductive: Normal size prostate with nonspecific internal prostatic calcifications. Other: No pneumoperitoneum, ascites or focal fluid collection. Musculoskeletal: No aggressive appearing focal osseous lesions. Moderate thoracolumbar spondylosis, most prominent at  L5-S1. IMPRESSION: 1. Cholelithiasis.  No CT findings of acute cholecystitis. 2. Solitary 4 mm choledocholith in the lower third of the common bile duct near the ampulla. CBD diameter 6 mm, top-normal. No intrahepatic biliary ductal dilatation. 3. Haziness of the fat surrounding the common bile duct, cannot exclude ascending cholangitis. 4. Mild patchy tree-in-bud opacities at the dependent right lung base, compatible with mild aspiration or infectious bronchiolitis. 5. 3.1 cm Abdominal Aortic Aneurysm (ICD10-I71.9). Recommend follow-up aortic ultrasound in 3 years. This recommendation follows ACR consensus guidelines: White Paper of the ACR Incidental Findings Committee II on Vascular Findings. J Am Coll Radiol 2013; 10:789-794. 6.  Aortic Atherosclerosis (ICD10-I70.0). 7. Small hiatal hernia. 8. Moderate diffuse colonic diverticulosis. Electronically Signed   By: Levell Reach M.D.   On: 07/21/2017 09:48   DG CHEST PORT 1 VIEW Result Date: 07/21/2017 CLINICAL DATA:  Fevers EXAM: PORTABLE CHEST 1 VIEW  COMPARISON:  01/06/2013 FINDINGS: The heart size and mediastinal contours are within normal limits. Both lungs are clear. The visualized skeletal structures are unremarkable. IMPRESSION: No active disease. Electronically Signed   By: Violeta Grey M.D.   On: 07/21/2017 07:47   CARDIAC CATHETERIZATION Result Date: 07/20/2017  Mid Cx to Dist Cx lesion, 100 %stenosed.  Mid RCA lesion, 20 %stenosed.  Prox Cx lesion, 20 %stenosed.  Ost 1st Diag to 1st Diag lesion, 60 %stenosed.  Prox LAD lesion, 20 %stenosed.  The left ventricular systolic function is normal.  LV end diastolic pressure is normal.  The left ventricular ejection fraction is greater than 65% by visual estimate.  There is no mitral valve regurgitation.  1. There is right to left filling of a small lateral wall branch. This appears to be a total occlusion of the distal Circumflex where the vessel becomes very small in caliber but cannot totally  exclude this being a small Diagonal branch. The distal AV groove Circumflex changes quickly to a small caliber vessel with a lateral branch. This could the site of total occlusion. The vascular territory supplied by this branch is very small. 2. Moderate stenosis in the ostium of the large first Diagonal branch with excellent flow down the vessel. This vessel represents a dual LAD. This lesion does not appear to be ulcerated and doe not appear to be flow limiting. 3. Mild stenosis in the proximal LAD. The mid LAD is tortuous and there is subtle dye staining in this bend with quick dye washout. The LAD continues to the apex. 4. The RCA is a large dominant vessel with mild plaque. The distal right gives off a collateral to a very small lateral wall vessel. 5. Normal LV systolic function 6. 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 As above, LBBB induced with the placement of the pigtail catheter in the LV. Echo in am. It is unclear if his presentation is due to the occlusion of the small lateral wall branch. He is at the completion of the cath not having any symptoms.    Assessment & Plan:   Problem List Items Addressed This Visit     Thrombocytopenia (HCC) - Primary   Resolved      PMR (polymyalgia rheumatica) (HCC)   Occasional stiffness On Deltasone  2.5 mg/d now Start Deltasone  3 mg qod and 2 mg qod Start B complex, Vit D      Hyperglycemia   Pt lost wt A1c      Essential hypertension   Continue metoprolol  and amlodipine          Meds ordered this encounter  Medications   predniSONE  (DELTASONE ) 1 MG tablet    Sig: Take 3 tablets (3 mg total) by mouth daily with breakfast.    Dispense:  100 tablet    Refill:  5      Follow-up: Return in about 3 months (around 02/16/2024) for a follow-up visit.  Anitra Barn, MD

## 2023-12-23 ENCOUNTER — Encounter: Payer: Self-pay | Admitting: Internal Medicine

## 2023-12-25 ENCOUNTER — Ambulatory Visit (INDEPENDENT_AMBULATORY_CARE_PROVIDER_SITE_OTHER): Payer: Medicare Other | Admitting: Internal Medicine

## 2023-12-25 ENCOUNTER — Encounter: Payer: Self-pay | Admitting: Internal Medicine

## 2023-12-25 ENCOUNTER — Ambulatory Visit: Payer: Medicare Other

## 2023-12-25 VITALS — BP 110/62 | HR 100 | Temp 98.7°F | Ht 71.0 in | Wt 214.0 lb

## 2023-12-25 DIAGNOSIS — I2510 Atherosclerotic heart disease of native coronary artery without angina pectoris: Secondary | ICD-10-CM

## 2023-12-25 DIAGNOSIS — M353 Polymyalgia rheumatica: Secondary | ICD-10-CM | POA: Diagnosis not present

## 2023-12-25 DIAGNOSIS — I480 Paroxysmal atrial fibrillation: Secondary | ICD-10-CM

## 2023-12-25 DIAGNOSIS — R0602 Shortness of breath: Secondary | ICD-10-CM | POA: Diagnosis not present

## 2023-12-25 DIAGNOSIS — R609 Edema, unspecified: Secondary | ICD-10-CM | POA: Diagnosis not present

## 2023-12-25 MED ORDER — FUROSEMIDE 20 MG PO TABS: 20.0000 mg | ORAL_TABLET | Freq: Every day | ORAL | 3 refills | Status: DC

## 2023-12-25 MED ORDER — APIXABAN 5 MG PO TABS: 5.0000 mg | ORAL_TABLET | Freq: Two times a day (BID) | ORAL | 11 refills | Status: AC

## 2023-12-25 NOTE — Patient Instructions (Signed)
Go to ER if worse 

## 2023-12-25 NOTE — Assessment & Plan Note (Signed)
 Occasional stiffness Start Deltasone 3 mg qod and 2 mg qod On B complex, Vit D

## 2023-12-25 NOTE — Assessment & Plan Note (Addendum)
 Mild edema B Furosemide po 20-40 mg qd Obtain ECHO, CXR

## 2023-12-25 NOTE — Progress Notes (Signed)
 Subjective:  Patient ID: Dustin Clayton, male    DOB: 20-Apr-1946  Age: 78 y.o. MRN: [id number]  CC: Medical Management of Chronic Issues (Difficulty with breathing )   HPI Dustin Clayton presents for SOB x 1 week, worse 2 nights ago, better last night. C/o CP, pressure  Outpatient Medications Prior to Visit  Medication Sig Dispense Refill   amLODipine (NORVASC) 2.5 MG tablet TAKE 1 TABLET(2.5 MG) BY MOUTH DAILY 90 tablet 3   aspirin EC 81 MG tablet Take 1 tablet (81 mg total) by mouth daily.     atorvastatin (LIPITOR) 20 MG tablet Take 1 tablet (20 mg total) by mouth daily. 90 tablet 0   B Complex-C (B-COMPLEX WITH VITAMIN C) tablet Take 1 tablet by mouth daily. 100 tablet 3   brimonidine (ALPHAGAN P) 0.1 % SOLN      Cholecalciferol (VITAMIN D3) 50 MCG (2000 UT) CAPS Take 1 capsule (2,000 Units total) by mouth daily. 100 capsule 3   Coenzyme Q10 (CO Q 10 PO) Take 1 capsule by mouth daily.     cyclobenzaprine (FLEXERIL) 5 MG tablet Take 1 tablet (5 mg total) by mouth 3 (three) times daily as needed for muscle spasms. 30 tablet 0   dorzolamide-timolol (COSOPT) 22.3-6.8 MG/ML ophthalmic solution Place 1 drop into the left eye 2 (two) times daily.     metoprolol tartrate (LOPRESSOR) 25 MG tablet Take 0.5 tablets (12.5 mg total) by mouth daily.     Multiple Vitamins-Minerals (MULTI FOR HIM) TABS Orally daily     predniSONE (DELTASONE) 1 MG tablet Take 3 tablets (3 mg total) by mouth daily with breakfast. 100 tablet 5   No facility-administered medications prior to visit.    ROS: Review of Systems  Constitutional:  Positive for fatigue and unexpected weight change. Negative for appetite change.  HENT:  Negative for congestion, nosebleeds, sneezing, sore throat and trouble swallowing.   Eyes:  Negative for itching and visual disturbance.  Respiratory:  Positive for shortness of breath. Negative for cough.   Cardiovascular:  Positive for chest pain. Negative for palpitations and leg  swelling.  Gastrointestinal:  Negative for abdominal distention, blood in stool, diarrhea and nausea.  Genitourinary:  Negative for frequency and hematuria.  Musculoskeletal:  Positive for back pain and gait problem. Negative for joint swelling and neck pain.  Skin:  Negative for rash.  Neurological:  Positive for weakness. Negative for dizziness, tremors, syncope, speech difficulty and light-headedness.  Psychiatric/Behavioral:  Negative for agitation, dysphoric mood and sleep disturbance. The patient is not nervous/anxious.     Objective:  BP 110/62   Pulse 100   Temp 98.7 F (37.1 C) (Oral)   Ht 5\' 11"  (1.803 m)   Wt 214 lb (97.1 kg)   SpO2 98%   BMI 29.85 kg/m   BP Readings from Last 3 Encounters:  12/25/23 110/62  11/18/23 104/60  08/18/23 110/70    Wt Readings from Last 3 Encounters:  12/25/23 214 lb (97.1 kg)  11/18/23 205 lb (93 kg)  08/18/23 218 lb 4 oz (99 kg)    Physical Exam Constitutional:      General: He is not in acute distress.    Appearance: He is well-developed. He is obese.     Comments: NAD  Eyes:     Conjunctiva/sclera: Conjunctivae normal.     Pupils: Pupils are equal, round, and reactive to light.  Neck:     Thyroid: No thyromegaly.     Vascular: No JVD.  Cardiovascular:  Rate and Rhythm: Normal rate. Rhythm irregular.     Heart sounds: Normal heart sounds. No murmur heard.    No friction rub. No gallop.  Pulmonary:     Effort: Pulmonary effort is normal. No respiratory distress.     Breath sounds: Normal breath sounds. No wheezing or rales.  Chest:     Chest wall: No tenderness.  Abdominal:     General: Bowel sounds are normal. There is no distension.     Palpations: Abdomen is soft. There is no mass.     Tenderness: There is no abdominal tenderness. There is no guarding or rebound.  Musculoskeletal:        General: No tenderness. Normal range of motion.     Cervical back: Normal range of motion.     Right lower leg: Edema  present.     Left lower leg: Edema present.  Lymphadenopathy:     Cervical: No cervical adenopathy.  Skin:    General: Skin is warm and dry.     Findings: No rash.  Neurological:     Mental Status: He is alert and oriented to person, place, and time.     Cranial Nerves: No cranial nerve deficit.     Motor: No abnormal muscle tone.     Coordination: Coordination normal.     Gait: Gait normal.     Deep Tendon Reflexes: Reflexes are normal and symmetric.  Psychiatric:        Behavior: Behavior normal.        Thought Content: Thought content normal.        Judgment: Judgment normal.   Trace edema B   Procedure: EKG Indication: chest pain, SOB Impression: A fib w/RVR. LBBB (not new). 103 bpm  Lab Results  Component Value Date   WBC 7.7 08/18/2023   HGB 14.0 08/18/2023   HCT 43.9 08/18/2023   PLT 173.0 08/18/2023   GLUCOSE 106 (H) 11/18/2023   CHOL 180 07/30/2022   TRIG 141.0 07/30/2022   HDL 85.80 07/30/2022   LDLCALC 66 07/30/2022   ALT 13 11/18/2023   AST 16 11/18/2023   NA 138 11/18/2023   K 4.8 11/18/2023   CL 103 11/18/2023   CREATININE 0.78 11/18/2023   BUN 11 11/18/2023   CO2 27 11/18/2023   TSH 4.20 11/18/2023   PSA 1.22 04/21/2023   INR 1.00 07/20/2017   HGBA1C 5.8 11/18/2023    ECHOCARDIOGRAM COMPLETE Result Date: 07/21/2017                            *[place]*                   *[facility]*                         [address]                        [place], [place] [address]                            [phone] ------------------------------------------------------------------- Transthoracic Echocardiography Patient:    Dustin, Clayton MR #:       [id number] Study Date: 07/21/2017 Gender:     M Age:        54 Height:     180.3 cm Weight:  101.7 kg BSA:        2.28 m^2 Pt. Status: Room:       2H22C  ATTENDING    Dustin Hamburg, MD  ADMITTING    Dustin Clayton, Dustin  REFERRING    Dustin Clayton  PERFORMING   [facility], Inpatient  SONOGRAPHER  Sinda Du, RDCS cc: ------------------------------------------------------------------- LV EF: 55% -   60% ------------------------------------------------------------------- Indications:      CAD of native vessels 414.01. ------------------------------------------------------------------- History:   PMH:   Myocardial infarction.  Risk factors: Hypertension. ------------------------------------------------------------------- Study Conclusions - Left ventricle: The cavity size was normal. There was moderate   concentric hypertrophy. Systolic function was normal. The   estimated ejection fraction was in the range of 55% to 60%. Wall   motion was normal; there were no regional wall motion   abnormalities. Doppler parameters are consistent with abnormal   left ventricular relaxation (grade 1 diastolic dysfunction). - Left atrium: The atrium was mildly dilated. - Right ventricle: The cavity size was mildly dilated. - Atrial septum: No defect or patent foramen ovale was identified. ------------------------------------------------------------------- Study data:  No prior study was available for comparison.  Study status:  Routine.  Procedure:  Transthoracic echocardiography. Image quality was adequate.  Study completion:  There were no complications.          Transthoracic echocardiography.  M-mode, complete 2D, spectral Doppler, and color Doppler.  Birthdate: Patient birthdate: [birth year].  Age:  Patient is 78 yr old.  Sex: Gender: male.    BMI: 31.3 kg/m^2.  Blood pressure:     127/69 Patient status:  Inpatient.  Study date:  Study date: 07/21/2017. Study time: 10:52 AM.  Location:  Bedside. ------------------------------------------------------------------- ------------------------------------------------------------------- Left ventricle:  The cavity size was normal. There was moderate concentric hypertrophy. Systolic function was normal. The estimated  ejection fraction was in the range of 55% to 60%. Wall motion was normal; there were no regional wall motion abnormalities. Doppler parameters are consistent with abnormal left ventricular relaxation (grade 1 diastolic dysfunction). There was no evidence of elevated ventricular filling pressure by Doppler parameters. ------------------------------------------------------------------- Aortic valve:   Structurally normal valve. Trileaflet. Cusp separation was normal.  Doppler:  Transvalvular velocity was within the normal range. There was no stenosis. There was no regurgitation. ------------------------------------------------------------------- Aorta:  Aortic root: The aortic root was normal in size. Ascending aorta: The ascending aorta was normal in size. ------------------------------------------------------------------- Mitral valve:   Structurally normal valve.   Leaflet separation was normal.  Doppler:  Transvalvular velocity was within the normal range. There was no evidence for stenosis. There was no regurgitation. ------------------------------------------------------------------- Left atrium:  The atrium was mildly dilated. ------------------------------------------------------------------- Atrial septum:  No defect or patent foramen ovale was identified.  ------------------------------------------------------------------- Right ventricle:  The cavity size was mildly dilated. Systolic function was normal. ------------------------------------------------------------------- Pulmonic valve:    Structurally normal valve.   Cusp separation was normal.  Doppler:  Transvalvular velocity was within the normal range. There was trivial regurgitation. ------------------------------------------------------------------- Tricuspid valve:   Structurally normal valve.   Leaflet separation was normal.  Doppler:  Transvalvular velocity was within the normal range. There was trivial regurgitation.  ------------------------------------------------------------------- Pulmonary artery:   Systolic pressure was within the normal range.  ------------------------------------------------------------------- Right atrium:  The atrium was normal in size. ------------------------------------------------------------------- Pericardium:  There was no pericardial effusion. ------------------------------------------------------------------- Systemic veins: Inferior vena cava: The vessel was normal in size. The respirophasic diameter changes were in the normal range (= 50%), consistent with  normal central venous pressure. ------------------------------------------------------------------- Measurements  Left ventricle                          Value        Reference  LV ID, ED, PLAX chordal                 52    mm     43 - 52  LV ID, ES, PLAX chordal                 30    mm     23 - 38  LV fx shortening, PLAX chordal          42    %      >=29  LV PW thickness, ED                     15    mm     ----------  IVS/LV PW ratio, ED                     1            <=1.3  Stroke volume, 2D                       74    ml     ----------  Stroke volume/bsa, 2D                   32    ml/m^2 ----------  LV e&', lateral                          6.53  cm/s   ----------  LV e&', medial                           4.9   cm/s   ----------  LV e&', average                          5.72  cm/s   ----------   Ventricular septum                      Value        Reference  IVS thickness, ED                       15    mm     ----------   LVOT                                    Value        Reference  LVOT ID, S                              20    mm     ----------  LVOT area                               3.14  cm^2   ----------  LVOT peak velocity, S  121   cm/s   ----------  LVOT mean velocity, S                   80.7  cm/s   ----------  LVOT VTI, S                             23.6  cm     ----------  LVOT peak gradient, S                    6     mm Hg  ----------   Aorta                                   Value        Reference  Aortic root ID, ED                      34    mm     ----------   Left atrium                             Value        Reference  LA ID, A-P, ES                          40    mm     ----------  LA ID/bsa, A-P                          1.75  cm/m^2 <=2.2  LA volume, S                            60    ml     ----------  LA volume/bsa, S                        26.3  ml/m^2 ----------  LA volume, ES, 1-p A4C                  65.2  ml     ----------  LA volume/bsa, ES, 1-p A4C              28.5  ml/m^2 ----------  LA volume, ES, 1-p A2C                  48.9  ml     ----------  LA volume/bsa, ES, 1-p A2C              21.4  ml/m^2 ----------   Mitral valve                            Value        Reference  Mitral deceleration time        (H)     289   ms     150 - 230  Mitral E/A ratio, peak                  0.7          ----------   Pulmonary arteries  Value        Reference  PA pressure, S, DP                      27    mm Hg  <=30   Tricuspid valve                         Value        Reference  Tricuspid regurg peak velocity          247   cm/s   ----------  Tricuspid peak RV-RA gradient           24    mm Hg  ----------   Right atrium                            Value        Reference  RA ID, S-I, ES, A4C             (H)     60.3  mm     34 - 49  RA area, ES, A4C                (H)     21.6  cm^2   8.3 - 19.5  RA volume, ES, A/L                      65.2  ml     ----------  RA volume/bsa, ES, A/L                  28.5  ml/m^2 ----------   Systemic veins                          Value        Reference  Estimated CVP                           3     mm Hg  ----------   Right ventricle                         Value        Reference  RV ID, minor axis, ED, A4C              42.95 mm     26 - 43  RV ID, minor axis, ED, A4C base         56    mm     ----------  TAPSE                                   23.1   mm     ----------  RV pressure, S, DP                      27    mm Hg  <=30  RV s&', lateral, S                       15.9  cm/s   ---------- Legend: (L)  and  (H)  mark values outside specified reference range. ------------------------------------------------------------------- Prepared and Electronically Authenticated by Thurmon Fair, MD 2018-09-18T13:09:48  US Abdomen Complete Result Date: 07/21/2017  CLINICAL DATA:  Elevated liver function tests. EXAM: ABDOMEN ULTRASOUND COMPLETE COMPARISON:  None. FINDINGS: Gallbladder: Multiple gallstones are noted with the largest measuring 1.3 cm. No significant gallbladder wall thickening or pericholecystic fluid is noted. Sludge is noted as well. No sonographic Murphy's sign is noted. Common bile duct: Diameter: 5.9 mm which is within normal limits. Liver: 1.1 cm simple cyst is noted in left hepatic lobe. Mildly increased echogenicity of hepatic parenchyma is noted suggesting fatty infiltration or hepatocellular disease. Portal vein is patent on color Doppler imaging with normal direction of blood flow towards the liver. IVC: No abnormality visualized. Pancreas: Visualized portion unremarkable. Spleen: Size and appearance within normal limits. Right Kidney: Length: 12.3 cm. Echogenicity within normal limits. No mass or hydronephrosis visualized. Left Kidney: Length: 13.1 cm. Echogenicity within normal limits. No mass or hydronephrosis visualized. Abdominal aorta: No aneurysm visualized. Other findings: None. IMPRESSION: Cholelithiasis without definite evidence of cholecystitis. Mildly increased echogenicity of hepatic parenchyma is noted suggesting fatty infiltration or diffuse hepatocellular disease. Small left hepatic cyst is noted. Electronically Signed   By: Lupita Raider, M.D.   On: 07/21/2017 09:54   CT ABDOMEN PELVIS W CONTRAST Result Date: 07/21/2017 CLINICAL DATA:  Diffuse abdominal pain and distention for several days. Elevated liver function tests.  Fever. EXAM: CT ABDOMEN AND PELVIS WITH CONTRAST TECHNIQUE: Multidetector CT imaging of the abdomen and pelvis was performed using the standard protocol following bolus administration of intravenous contrast. CONTRAST:  ISOVUE-300 IOPAMIDOL (ISOVUE-300) INJECTION 61% COMPARISON:  07/21/2017 abdominal sonogram. FINDINGS: Lower chest: Mild patchy tree-in-bud opacities in the dependent right lung base. Subsegmental atelectasis in the dependent lung bases. Hepatobiliary: Normal liver size. A few simple lateral segment left liver lobe cysts, largest 2.0 cm. Several subcentimeter hypodense lesions scattered throughout the liver, too small to characterize, for which no follow-up is required unless the patient has risk factors for liver malignancy. Nondistended gallbladder contains multiple calcified gallstones measuring up to 1.8 cm. No definite gallbladder wall thickening or pericholecystic fluid. No significant intrahepatic biliary ductal dilatation. Common bile duct diameter 6 mm, top-normal. There is a 4 mm calcified stone in the lower third of the common bile duct near the ampulla. There is mild haziness of the fat surrounding the common bile duct. Pancreas: Normal, with no mass or duct dilation. Spleen: Normal size. No mass. Adrenals/Urinary Tract: Normal adrenals. Normal kidneys with no hydronephrosis and no renal mass. Normal bladder. Stomach/Bowel: Small hiatal hernia. Otherwise nondistended and grossly normal stomach. Normal caliber small bowel with no small bowel wall thickening. Appendectomy. Moderate diffuse colonic diverticulosis, most prominent in the sigmoid colon, with no large bowel wall thickening or pericolonic fat stranding. 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 Patent portal, splenic, hepatic and renal veins. No pathologically enlarged lymph nodes in the abdomen or pelvis. Reproductive: Normal size prostate with nonspecific internal  prostatic calcifications. Other: No pneumoperitoneum, ascites or focal fluid collection. Musculoskeletal: No aggressive appearing focal osseous lesions. Moderate thoracolumbar spondylosis, most prominent at L5-S1. IMPRESSION: 1. Cholelithiasis.  No CT findings of acute cholecystitis. 2. Solitary 4 mm choledocholith in the lower third of the common bile duct near the ampulla. CBD diameter 6 mm, top-normal. No intrahepatic biliary ductal dilatation. 3. Haziness of the fat surrounding the common bile duct, cannot exclude ascending cholangitis. 4. Mild patchy tree-in-bud opacities at the dependent right lung base, compatible with mild aspiration or infectious bronchiolitis. 5. 3.1 cm Abdominal Aortic Aneurysm (ICD10-I71.9). Recommend follow-up aortic ultrasound in 3 years.  This recommendation follows ACR consensus guidelines: White Paper of the ACR Incidental Findings Committee II on Vascular Findings. J Am Coll Radiol 2013; 10:789-794. 6.  Aortic Atherosclerosis (ICD10-I70.0). 7. Small hiatal hernia. 8. Moderate diffuse colonic diverticulosis. Electronically Signed   By: Delbert Phenix M.D.   On: 07/21/2017 09:48   DG CHEST PORT 1 VIEW Result Date: 07/21/2017 CLINICAL DATA:  Fevers EXAM: PORTABLE CHEST 1 VIEW COMPARISON:  01/06/2013 FINDINGS: The heart size and mediastinal contours are within normal limits. Both lungs are clear. The visualized skeletal structures are unremarkable. IMPRESSION: No active disease. Electronically Signed   By: Alcide Clever M.D.   On: 07/21/2017 07:47   CARDIAC CATHETERIZATION Result Date: 07/20/2017  Mid Cx to Dist Cx lesion, 100 %stenosed.  Mid RCA lesion, 20 %stenosed.  Prox Cx lesion, 20 %stenosed.  Ost 1st Diag to 1st Diag lesion, 60 %stenosed.  Prox LAD lesion, 20 %stenosed.  The left ventricular systolic function is normal.  LV end diastolic pressure is normal.  The left ventricular ejection fraction is greater than 65% by visual estimate.  There is no mitral valve  regurgitation.  1. There is right to left filling of a small lateral wall branch. This appears to be a total occlusion of the distal Circumflex where the vessel becomes very small in caliber but cannot totally exclude this being a small Diagonal branch. The distal AV groove Circumflex changes quickly to a small caliber vessel with a lateral branch. This could the site of total occlusion. The vascular territory supplied by this branch is very small. 2. Moderate stenosis in the ostium of the large first Diagonal branch with excellent flow down the vessel. This vessel represents a dual LAD. This lesion does not appear to be ulcerated and doe not appear to be flow limiting. 3. Mild stenosis in the proximal LAD. The mid LAD is tortuous and there is subtle dye staining in this bend with quick dye washout. The LAD continues to the apex. 4. The RCA is a large dominant vessel with mild plaque. The distal right gives off a collateral to a very small lateral wall vessel. 5. Normal LV systolic function 6. 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 As above, LBBB induced with the placement of the pigtail catheter in the LV. Echo in am. It is unclear if his presentation is due to the occlusion of the small lateral wall branch. He is at the completion of the cath not having any symptoms.    Assessment & Plan:   Problem List Items Addressed This Visit     PMR (polymyalgia rheumatica) (HCC)   Occasional stiffness Start Deltasone 3 mg qod and 2 mg qod On B complex, Vit D      Atrial fibrillation (HCC) - Primary   RVR: EKG - done CXR Recent labs were reviewed Ordered ECHO Start  Eliquis, d/c ASA Cont on Metoprolol and Amlodipine       Relevant Medications   apixaban (ELIQUIS) 5 MG TABS tablet   furosemide (LASIX) 20 MG tablet   Other Relevant Orders   EKG 12-Lead   DG Chest 2 View   Ambulatory  referral to Cardiology   ECHOCARDIOGRAM COMPLETE   Edema   Mild edema B Furosemide po 20-40 mg qd Obtain ECHO, CXR      CAD (coronary artery disease), native coronary artery   Started on Eliquis for Afib w/RVR Cardiology ref ECHO ordered  Relevant Medications   apixaban (ELIQUIS) 5 MG TABS tablet   furosemide (LASIX) 20 MG tablet   Other Visit Diagnoses       Shortness of breath       Relevant Orders   Ambulatory referral to Cardiology   ECHOCARDIOGRAM COMPLETE         Meds ordered this encounter  Medications   apixaban (ELIQUIS) 5 MG TABS tablet    Sig: Take 1 tablet (5 mg total) by mouth 2 (two) times daily.    Dispense:  60 tablet    Refill:  11   furosemide (LASIX) 20 MG tablet    Sig: Take 1-2 tablets (20-40 mg total) by mouth daily.    Dispense:  60 tablet    Refill:  3      Follow-up: Return in about 4 weeks (around 01/22/2024) for a follow-up visit.  Sonda Primes, MD

## 2023-12-25 NOTE — Assessment & Plan Note (Signed)
 RVR: EKG - done CXR Recent labs were reviewed Ordered ECHO Start  Eliquis, d/c ASA Cont on Metoprolol and Amlodipine

## 2023-12-25 NOTE — Assessment & Plan Note (Signed)
 Started on Eliquis for Afib w/RVR Cardiology ref ECHO ordered

## 2023-12-28 ENCOUNTER — Encounter: Payer: Self-pay | Admitting: Internal Medicine

## 2024-01-05 ENCOUNTER — Other Ambulatory Visit: Payer: Self-pay | Admitting: Cardiology

## 2024-01-05 ENCOUNTER — Other Ambulatory Visit: Payer: Self-pay | Admitting: Internal Medicine

## 2024-01-05 ENCOUNTER — Other Ambulatory Visit: Payer: Self-pay

## 2024-01-05 ENCOUNTER — Encounter: Payer: Self-pay | Admitting: Internal Medicine

## 2024-01-05 DIAGNOSIS — M353 Polymyalgia rheumatica: Secondary | ICD-10-CM

## 2024-01-05 DIAGNOSIS — D696 Thrombocytopenia, unspecified: Secondary | ICD-10-CM

## 2024-01-05 DIAGNOSIS — I10 Essential (primary) hypertension: Secondary | ICD-10-CM

## 2024-01-05 MED ORDER — METOPROLOL TARTRATE 25 MG PO TABS: 12.5000 mg | ORAL_TABLET | Freq: Every day | ORAL | Status: DC

## 2024-01-05 MED ORDER — METOPROLOL TARTRATE 25 MG PO TABS: 12.5000 mg | ORAL_TABLET | Freq: Every day | ORAL | 3 refills | Status: DC

## 2024-01-06 MED ORDER — ATORVASTATIN CALCIUM 20 MG PO TABS: 20.0000 mg | ORAL_TABLET | Freq: Every day | ORAL | 2 refills | Status: DC

## 2024-01-07 ENCOUNTER — Encounter: Payer: Self-pay | Admitting: Internal Medicine

## 2024-01-07 ENCOUNTER — Ambulatory Visit: Payer: Medicare Other | Admitting: Internal Medicine

## 2024-01-07 VITALS — BP 118/78 | HR 91 | Temp 98.6°F | Ht 71.0 in | Wt 198.0 lb

## 2024-01-07 DIAGNOSIS — I509 Heart failure, unspecified: Secondary | ICD-10-CM | POA: Diagnosis not present

## 2024-01-07 DIAGNOSIS — M353 Polymyalgia rheumatica: Secondary | ICD-10-CM

## 2024-01-07 DIAGNOSIS — I480 Paroxysmal atrial fibrillation: Secondary | ICD-10-CM | POA: Diagnosis not present

## 2024-01-07 DIAGNOSIS — Z7901 Long term (current) use of anticoagulants: Secondary | ICD-10-CM | POA: Diagnosis not present

## 2024-01-07 NOTE — Assessment & Plan Note (Signed)
 A fib w/RVR - better. DOE  - doing better, walking in the street now. Lost 16 lbs Cont Furosemide Cardiology appt is pending

## 2024-01-07 NOTE — Assessment & Plan Note (Signed)
 Doing better, walking in the street now

## 2024-01-07 NOTE — Assessment & Plan Note (Signed)
 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 DOE  - doing better, walking in the street now. Lost 16 lbs. ECHO is pending... Cont Furosemide, other meds Cardiology appt is pending

## 2024-01-07 NOTE — Assessment & Plan Note (Signed)
Cont w/Eliquis 

## 2024-01-07 NOTE — Progress Notes (Signed)
 CHF,   Subjective:  Patient ID: Dustin Clayton, male    DOB: 07/07/1946  Age: 78 y.o. MRN: [id number]  CC: Medical Management of Chronic Issues (Rheumatic pains and SOB)   HPI [name] presents for CHF, dyslipidemia, A fib w/RVR Doing better, walking in the street now  Outpatient Medications Prior to Visit  Medication Sig Dispense Refill   amLODipine (NORVASC) 2.5 MG tablet TAKE 1 TABLET(2.5 MG) BY MOUTH DAILY 90 tablet 3   apixaban (ELIQUIS) 5 MG TABS tablet Take 1 tablet (5 mg total) by mouth 2 (two) times daily. 60 tablet 11   aspirin EC 81 MG tablet Take 1 tablet (81 mg total) by mouth daily.     atorvastatin (LIPITOR) 20 MG tablet Take 1 tablet (20 mg total) by mouth daily. 90 tablet 2   B Complex-C (B-COMPLEX WITH VITAMIN C) tablet Take 1 tablet by mouth daily. 100 tablet 3   brimonidine (ALPHAGAN P) 0.1 % SOLN      Cholecalciferol (VITAMIN D3) 50 MCG (2000 UT) CAPS Take 1 capsule (2,000 Units total) by mouth daily. 100 capsule 3   Coenzyme Q10 (CO Q 10 PO) Take 1 capsule by mouth daily.     cyclobenzaprine (FLEXERIL) 5 MG tablet Take 1 tablet (5 mg total) by mouth 3 (three) times daily as needed for muscle spasms. 30 tablet 0   dorzolamide-timolol (COSOPT) 22.3-6.8 MG/ML ophthalmic solution Place 1 drop into the left eye 2 (two) times daily.     furosemide (LASIX) 20 MG tablet Take 1-2 tablets (20-40 mg total) by mouth daily. 60 tablet 3   metoprolol tartrate (LOPRESSOR) 25 MG tablet Take 0.5 tablets (12.5 mg total) by mouth daily. 90 tablet 3   Multiple Vitamins-Minerals (MULTI FOR HIM) TABS Orally daily     predniSONE (DELTASONE) 1 MG tablet Take 3 tablets (3 mg total) by mouth daily with breakfast. 100 tablet 5   No facility-administered medications prior to visit.    ROS: Review of Systems  Constitutional:  Positive for fatigue and unexpected weight change. Negative for appetite change.  HENT:  Negative for congestion, nosebleeds, sneezing, sore throat and trouble  swallowing.   Eyes:  Negative for itching and visual disturbance.  Respiratory:  Positive for shortness of breath. Negative for cough.   Cardiovascular:  Negative for chest pain, palpitations and leg swelling.  Gastrointestinal:  Negative for abdominal distention, blood in stool, diarrhea and nausea.  Genitourinary:  Negative for frequency and hematuria.  Musculoskeletal:  Negative for back pain, gait problem, joint swelling and neck pain.  Skin:  Negative for rash.  Neurological:  Negative for dizziness, tremors, speech difficulty and weakness.  Psychiatric/Behavioral:  Negative for agitation, dysphoric mood and sleep disturbance. The patient is not nervous/anxious.     Objective:  BP 118/78   Pulse 91   Temp 98.6 F (37 C) (Oral)   Ht 5\' 11"  (1.803 m)   Wt 198 lb (89.8 kg)   SpO2 94%   BMI 27.62 kg/m   BP Readings from Last 3 Encounters:  01/07/24 118/78  12/25/23 110/62  11/18/23 104/60    Wt Readings from Last 3 Encounters:  01/07/24 198 lb (89.8 kg)  12/25/23 214 lb (97.1 kg)  11/18/23 205 lb (93 kg)    Physical Exam Constitutional:      General: He is not in acute distress.    Appearance: He is well-developed. He is obese.     Comments: NAD  Eyes:     Conjunctiva/sclera: Conjunctivae normal.  Pupils: Pupils are equal, round, and reactive to light.  Neck:     Thyroid: No thyromegaly.     Vascular: No JVD.  Cardiovascular:     Rate and Rhythm: Normal rate and regular rhythm.     Heart sounds: Normal heart sounds. No murmur heard.    No friction rub. No gallop.  Pulmonary:     Effort: Pulmonary effort is normal. No respiratory distress.     Breath sounds: Normal breath sounds. No wheezing or rales.  Chest:     Chest wall: No tenderness.  Abdominal:     General: Bowel sounds are normal. There is no distension.     Palpations: Abdomen is soft. There is no mass.     Tenderness: There is no abdominal tenderness. There is no guarding or rebound.   Musculoskeletal:        General: No tenderness. Normal range of motion.     Cervical back: Normal range of motion.  Lymphadenopathy:     Cervical: No cervical adenopathy.  Skin:    General: Skin is warm and dry.     Findings: No rash.  Neurological:     Mental Status: He is alert and oriented to person, place, and time.     Cranial Nerves: No cranial nerve deficit.     Motor: No abnormal muscle tone.     Coordination: Coordination normal.     Gait: Gait normal.     Deep Tendon Reflexes: Reflexes are normal and symmetric.  Psychiatric:        Behavior: Behavior normal.        Thought Content: Thought content normal.        Judgment: Judgment normal.     Lab Results  Component Value Date   WBC 7.7 08/18/2023   HGB 14.0 08/18/2023   HCT 43.9 08/18/2023   PLT 173.0 08/18/2023   GLUCOSE 106 (H) 11/18/2023   CHOL 180 07/30/2022   TRIG 141.0 07/30/2022   HDL 85.80 07/30/2022   LDLCALC 66 07/30/2022   ALT 13 11/18/2023   AST 16 11/18/2023   NA 138 11/18/2023   K 4.8 11/18/2023   CL 103 11/18/2023   CREATININE 0.78 11/18/2023   BUN 11 11/18/2023   CO2 27 11/18/2023   TSH 4.20 11/18/2023   PSA 1.22 04/21/2023   INR 1.00 07/20/2017   HGBA1C 5.8 11/18/2023    ECHOCARDIOGRAM COMPLETE Result Date: 07/21/2017                            *[facility]*                   *[facility]*                         [address]                        [place], [place] [address]                            [phone] ------------------------------------------------------------------- Transthoracic Echocardiography Patient:    Dustin Clayton MR #:       [id number] Study Date: 07/21/2017 Gender:     M Age:        23 Height:     180.3 cm Weight:     101.7 kg BSA:  2.28 m^2 Pt. 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 ------------------------------------------------------------------- History:   PMH:   Myocardial infarction.  Risk factors: Hypertension. ------------------------------------------------------------------- Study Conclusions - Left ventricle: The cavity size was normal. There was moderate   concentric hypertrophy. Systolic function was normal. The   estimated ejection fraction was in the range of 55% to 60%. Wall   motion was normal; there were no regional wall motion   abnormalities. Doppler parameters are consistent with abnormal   left ventricular relaxation (grade 1 diastolic dysfunction). - Left atrium: The atrium was mildly dilated. - Right ventricle: The cavity size was mildly dilated. - Atrial septum: No defect or patent foramen ovale was identified. ------------------------------------------------------------------- Study data:  No prior study was available for comparison.  Study status:  Routine.  Procedure:  Transthoracic echocardiography. Image quality was adequate.  Study completion:  There were no complications.          Transthoracic echocardiography.  M-mode, complete 2D, spectral Doppler, and color Doppler.  Birthdate: Patient birthdate: August 26, 1946.  Age:  Patient is 78 yr old.  Sex: Gender: male.    BMI: 31.3 kg/m^2.  Blood pressure:     127/69 Patient status:  Inpatient.  Study date:  Study date: 07/21/2017. Study time: 10:52 AM.  Location:  Bedside. ------------------------------------------------------------------- ------------------------------------------------------------------- Left ventricle:  The cavity size was normal. There was moderate concentric hypertrophy. Systolic function was normal. The estimated ejection fraction was in the range of 55% to 60%.  Wall motion was normal; there were no regional wall motion abnormalities. Doppler parameters are consistent with abnormal left ventricular relaxation (grade 1 diastolic dysfunction). There was no evidence of elevated ventricular filling pressure by Doppler parameters. ------------------------------------------------------------------- Aortic valve:   Structurally normal valve. Trileaflet. Cusp separation was normal.  Doppler:  Transvalvular velocity was within the normal range. There was no stenosis. There was no regurgitation. ------------------------------------------------------------------- Aorta:  Aortic root: The aortic root was normal in size. Ascending aorta: The ascending aorta was normal in size. ------------------------------------------------------------------- Mitral valve:   Structurally normal valve.   Leaflet separation was normal.  Doppler:  Transvalvular velocity was within the normal range. There was no evidence for stenosis. There was no regurgitation. ------------------------------------------------------------------- Left atrium:  The atrium was mildly dilated. ------------------------------------------------------------------- Atrial septum:  No defect or patent foramen ovale was identified.  ------------------------------------------------------------------- Right ventricle:  The cavity size was mildly dilated. Systolic function was normal. ------------------------------------------------------------------- Pulmonic valve:    Structurally normal valve.   Cusp separation was normal.  Doppler:  Transvalvular velocity was within the normal range. There was trivial regurgitation. ------------------------------------------------------------------- Tricuspid valve:   Structurally normal valve.   Leaflet separation was normal.  Doppler:  Transvalvular velocity was within the normal range. There was trivial regurgitation. ------------------------------------------------------------------- Pulmonary  artery:   Systolic pressure was within the normal range.  ------------------------------------------------------------------- Right atrium:  The atrium was normal in size. ------------------------------------------------------------------- Pericardium:  There was no pericardial effusion. ------------------------------------------------------------------- Systemic veins: Inferior vena cava: The vessel was normal in size. The respirophasic diameter changes were in the normal range (= 50%), consistent with normal central venous pressure. ------------------------------------------------------------------- Measurements  Left ventricle  Value        Reference  LV ID, ED, PLAX chordal                 52    mm     43 - 52  LV ID, ES, PLAX chordal                 30    mm     23 - 38  LV fx shortening, PLAX chordal          42    %      >=29  LV PW thickness, ED                     15    mm     ----------  IVS/LV PW ratio, ED                     1            <=1.3  Stroke volume, 2D                       74    ml     ----------  Stroke volume/bsa, 2D                   32    ml/m^2 ----------  LV e&', lateral                          6.53  cm/s   ----------  LV e&', medial                           4.9   cm/s   ----------  LV e&', average                          5.72  cm/s   ----------   Ventricular septum                      Value        Reference  IVS thickness, ED                       15    mm     ----------   LVOT                                    Value        Reference  LVOT ID, S                              20    mm     ----------  LVOT area                               3.14  cm^2   ----------  LVOT peak velocity, S                   121   cm/s   ----------  LVOT mean velocity, S                   80.7  cm/s   ----------  LVOT VTI, S                             23.6  cm     ----------  LVOT peak gradient, S                   6     mm Hg  ----------   Aorta                                    Value        Reference  Aortic root ID, ED                      34    mm     ----------   Left atrium                             Value        Reference  LA ID, A-P, ES                          40    mm     ----------  LA ID/bsa, A-P                          1.75  cm/m^2 <=2.2  LA volume, S                            60    ml     ----------  LA volume/bsa, S                        26.3  ml/m^2 ----------  LA volume, ES, 1-p A4C                  65.2  ml     ----------  LA volume/bsa, ES, 1-p A4C              28.5  ml/m^2 ----------  LA volume, ES, 1-p A2C                  48.9  ml     ----------  LA volume/bsa, ES, 1-p A2C              21.4  ml/m^2 ----------   Mitral valve                            Value        Reference  Mitral deceleration time        (H)     289   ms     150 - 230  Mitral E/A ratio, peak                  0.7          ----------   Pulmonary arteries                      Value        Reference  PA pressure, S, DP  27    mm Hg  <=30   Tricuspid valve                         Value        Reference  Tricuspid regurg peak velocity          247   cm/s   ----------  Tricuspid peak RV-RA gradient           24    mm Hg  ----------   Right atrium                            Value        Reference  RA ID, S-I, ES, A4C             (H)     60.3  mm     34 - 49  RA area, ES, A4C                (H)     21.6  cm^2   8.3 - 19.5  RA volume, ES, A/L                      65.2  ml     ----------  RA volume/bsa, ES, A/L                  28.5  ml/m^2 ----------   Systemic veins                          Value        Reference  Estimated CVP                           3     mm Hg  ----------   Right ventricle                         Value        Reference  RV ID, minor axis, ED, A4C              42.95 mm     26 - 43  RV ID, minor axis, ED, A4C base         56    mm     ----------  TAPSE                                   23.1  mm     ----------  RV pressure, S, DP                      27    mm Hg  <=30   RV s&', lateral, S                       15.9  cm/s   ---------- Legend: (L)  and  (H)  mark values outside specified reference range. ------------------------------------------------------------------- Prepared and Electronically Authenticated by Thurmon Fair, MD 2018-09-18T13:09:48  US Abdomen Complete Result Date: 07/21/2017 CLINICAL DATA:  Elevated liver function tests. EXAM: ABDOMEN ULTRASOUND COMPLETE COMPARISON:  None. FINDINGS: Gallbladder: Multiple gallstones are noted with the largest measuring 1.3 cm. No significant gallbladder wall thickening or pericholecystic fluid is  noted. Sludge is noted as well. No sonographic Murphy's sign is noted. Common bile duct: Diameter: 5.9 mm which is within normal limits. Liver: 1.1 cm simple cyst is noted in left hepatic lobe. Mildly increased echogenicity of hepatic parenchyma is noted suggesting fatty infiltration or hepatocellular disease. Portal vein is patent on color Doppler imaging with normal direction of blood flow towards the liver. IVC: No abnormality visualized. Pancreas: Visualized portion unremarkable. Spleen: Size and appearance within normal limits. Right Kidney: Length: 12.3 cm. Echogenicity within normal limits. No mass or hydronephrosis visualized. Left Kidney: Length: 13.1 cm. Echogenicity within normal limits. No mass or hydronephrosis visualized. Abdominal aorta: No aneurysm visualized. Other findings: None. IMPRESSION: Cholelithiasis without definite evidence of cholecystitis. Mildly increased echogenicity of hepatic parenchyma is noted suggesting fatty infiltration or diffuse hepatocellular disease. Small left hepatic cyst is noted. Electronically Signed   By: Lupita Raider, M.D.   On: 07/21/2017 09:54   CT ABDOMEN PELVIS W CONTRAST Result Date: 07/21/2017 CLINICAL DATA:  Diffuse abdominal pain and distention for several days. Elevated liver function tests. Fever. EXAM: CT ABDOMEN AND PELVIS WITH CONTRAST TECHNIQUE: Multidetector CT  imaging of the abdomen and pelvis was performed using the standard protocol following bolus administration of intravenous contrast. CONTRAST:  ISOVUE-300 IOPAMIDOL (ISOVUE-300) INJECTION 61% COMPARISON:  07/21/2017 abdominal sonogram. FINDINGS: Lower chest: Mild patchy tree-in-bud opacities in the dependent right lung base. Subsegmental atelectasis in the dependent lung bases. Hepatobiliary: Normal liver size. A few simple lateral segment left liver lobe cysts, largest 2.0 cm. Several subcentimeter hypodense lesions scattered throughout the liver, too small to characterize, for which no follow-up is required unless the patient has risk factors for liver malignancy. Nondistended gallbladder contains multiple calcified gallstones measuring up to 1.8 cm. No definite gallbladder wall thickening or pericholecystic fluid. No significant intrahepatic biliary ductal dilatation. Common bile duct diameter 6 mm, top-normal. There is a 4 mm calcified stone in the lower third of the common bile duct near the ampulla. There is mild haziness of the fat surrounding the common bile duct. Pancreas: Normal, with no mass or duct dilation. Spleen: Normal size. No mass. Adrenals/Urinary Tract: Normal adrenals. Normal kidneys with no hydronephrosis and no renal mass. Normal bladder. Stomach/Bowel: Small hiatal hernia. Otherwise nondistended and grossly normal stomach. Normal caliber small bowel with no small bowel wall thickening. Appendectomy. Moderate diffuse colonic diverticulosis, most prominent in the sigmoid colon, with no large bowel wall thickening or pericolonic fat stranding. 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 Patent portal, splenic, hepatic and renal veins. No pathologically enlarged lymph nodes in the abdomen or pelvis. Reproductive: Normal size prostate with nonspecific internal prostatic calcifications. Other: No pneumoperitoneum, ascites or focal fluid  collection. Musculoskeletal: No aggressive appearing focal osseous lesions. Moderate thoracolumbar spondylosis, most prominent at L5-S1. IMPRESSION: 1. Cholelithiasis.  No CT findings of acute cholecystitis. 2. Solitary 4 mm choledocholith in the lower third of the common bile duct near the ampulla. CBD diameter 6 mm, top-normal. No intrahepatic biliary ductal dilatation. 3. Haziness of the fat surrounding the common bile duct, cannot exclude ascending cholangitis. 4. Mild patchy tree-in-bud opacities at the dependent right lung base, compatible with mild aspiration or infectious bronchiolitis. 5. 3.1 cm Abdominal Aortic Aneurysm (ICD10-I71.9). Recommend follow-up aortic ultrasound in 3 years. This recommendation follows ACR consensus guidelines: White Paper of the ACR Incidental Findings Committee II on Vascular Findings. J Am Coll Radiol 2013; 10:789-794. 6.  Aortic Atherosclerosis (ICD10-I70.0). 7. Small hiatal hernia. 8. Moderate  diffuse colonic diverticulosis. Electronically Signed   By: Delbert Phenix M.D.   On: 07/21/2017 09:48   DG CHEST PORT 1 VIEW Result Date: 07/21/2017 CLINICAL DATA:  Fevers EXAM: PORTABLE CHEST 1 VIEW COMPARISON:  01/06/2013 FINDINGS: The heart size and mediastinal contours are within normal limits. Both lungs are clear. The visualized skeletal structures are unremarkable. IMPRESSION: No active disease. Electronically Signed   By: Alcide Clever M.D.   On: 07/21/2017 07:47   CARDIAC CATHETERIZATION Result Date: 07/20/2017  Mid Cx to Dist Cx lesion, 100 %stenosed.  Mid RCA lesion, 20 %stenosed.  Prox Cx lesion, 20 %stenosed.  Ost 1st Diag to 1st Diag lesion, 60 %stenosed.  Prox LAD lesion, 20 %stenosed.  The left ventricular systolic function is normal.  LV end diastolic pressure is normal.  The left ventricular ejection fraction is greater than 65% by visual estimate.  There is no mitral valve regurgitation.  1. There is right to left filling of a small lateral wall branch.  This appears to be a total occlusion of the distal Circumflex where the vessel becomes very small in caliber but cannot totally exclude this being a small Diagonal branch. The distal AV groove Circumflex changes quickly to a small caliber vessel with a lateral branch. This could the site of total occlusion. The vascular territory supplied by this branch is very small. 2. Moderate stenosis in the ostium of the large first Diagonal branch with excellent flow down the vessel. This vessel represents a dual LAD. This lesion does not appear to be ulcerated and doe not appear to be flow limiting. 3. Mild stenosis in the proximal LAD. The mid LAD is tortuous and there is subtle dye staining in this bend with quick dye washout. The LAD continues to the apex. 4. The RCA is a large dominant vessel with mild plaque. The distal right gives off a collateral to a very small lateral wall vessel. 5. Normal LV systolic function 6. 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 As above, LBBB induced with the placement of the pigtail catheter in the LV. Echo in am. It is unclear if his presentation is due to the occlusion of the small lateral wall branch. He is at the completion of the cath not having any symptoms.    Assessment & Plan:   Problem List Items Addressed This Visit     Congestive heart failure (CHF) (HCC) - Primary   A fib w/RVR - better. DOE  - doing better, walking in the street now. Lost 16 lbs Cont Furosemide Cardiology appt is pending      PMR (polymyalgia rheumatica) (HCC)   Doing better, walking in the street now      Atrial fibrillation (HCC)   CHF due to A fib w/RVR - better. DOE  - doing better, walking in the street now. Lost 16 lbs. ECHO is pending... Cont Furosemide, other meds Cardiology appt is pending      Anticoagulated   Cont w/Eliquis          No orders of the defined types  were placed in this encounter.     Follow-up: Return in about 3 months (around 04/08/2024) for a follow-up visit.  Sonda Primes, MD

## 2024-01-09 ENCOUNTER — Encounter: Payer: Self-pay | Admitting: Internal Medicine

## 2024-01-11 ENCOUNTER — Ambulatory Visit (HOSPITAL_COMMUNITY): Payer: Medicare Other | Attending: Internal Medicine

## 2024-01-11 DIAGNOSIS — I480 Paroxysmal atrial fibrillation: Secondary | ICD-10-CM | POA: Insufficient documentation

## 2024-01-11 DIAGNOSIS — R0602 Shortness of breath: Secondary | ICD-10-CM | POA: Insufficient documentation

## 2024-01-12 LAB — ECHOCARDIOGRAM COMPLETE
Area-P 1/2: 4.89 cm2
S' Lateral: 5.6 cm

## 2024-01-13 ENCOUNTER — Encounter: Payer: Self-pay | Admitting: Internal Medicine

## 2024-01-20 ENCOUNTER — Ambulatory Visit (INDEPENDENT_AMBULATORY_CARE_PROVIDER_SITE_OTHER)

## 2024-01-20 DIAGNOSIS — Z23 Encounter for immunization: Secondary | ICD-10-CM

## 2024-01-20 NOTE — Progress Notes (Cosign Needed Addendum)
 Patient visits today for their high dose flu shot. Patient informed of what they had received and tolerated injection well. Patient notified to reach out to office if needed.  Medical screening examination/treatment/procedure(s) were performed by non-physician practitioner and as supervising physician I was immediately available for consultation/collaboration.  I agree with above. Jacinta Shoe, MD

## 2024-02-01 ENCOUNTER — Ambulatory Visit: Attending: Emergency Medicine | Admitting: Emergency Medicine

## 2024-02-01 ENCOUNTER — Encounter: Payer: Self-pay | Admitting: Emergency Medicine

## 2024-02-01 VITALS — BP 100/64 | HR 85 | Ht 71.0 in | Wt 200.0 lb

## 2024-02-01 DIAGNOSIS — I4819 Other persistent atrial fibrillation: Secondary | ICD-10-CM | POA: Diagnosis not present

## 2024-02-01 DIAGNOSIS — E782 Mixed hyperlipidemia: Secondary | ICD-10-CM

## 2024-02-01 DIAGNOSIS — I25118 Atherosclerotic heart disease of native coronary artery with other forms of angina pectoris: Secondary | ICD-10-CM

## 2024-02-01 DIAGNOSIS — I10 Essential (primary) hypertension: Secondary | ICD-10-CM | POA: Diagnosis not present

## 2024-02-01 DIAGNOSIS — I5020 Unspecified systolic (congestive) heart failure: Secondary | ICD-10-CM | POA: Diagnosis present

## 2024-02-01 DIAGNOSIS — I2510 Atherosclerotic heart disease of native coronary artery without angina pectoris: Secondary | ICD-10-CM | POA: Diagnosis not present

## 2024-02-01 DIAGNOSIS — I340 Nonrheumatic mitral (valve) insufficiency: Secondary | ICD-10-CM

## 2024-02-01 DIAGNOSIS — I447 Left bundle-branch block, unspecified: Secondary | ICD-10-CM

## 2024-02-01 DIAGNOSIS — E785 Hyperlipidemia, unspecified: Secondary | ICD-10-CM

## 2024-02-01 NOTE — H&P (View-Only) (Signed)
 Cardiology Office Note:    Date:  02/02/2024  ID:  Dustin Clayton, DOB Dec 24, 1945, MRN [id number] PCP: Tresa Garter, MD  [facility] Providers Cardiologist:  Peter Swaziland, MD       Patient Profile:      Chief Complaint: Acute visit for abnormal echocardiogram History of Present Illness:  Dustin Clayton is a 78 y.o. male with visit-pertinent history of coronary artery disease, persistent A-fib, chronic HFmrEF, aortic root dilation, hypertension, hyperlipidemia, PMR  He was first evaluated by cardiology on 07/20/2017 for STEMI.  He was taken emergently to Cath Lab which showed CTO distal LCx, 60 to 70% ostial D1 stenosis but no clear culprit.  EF was normal by catheterization and echocardiogram.  It was felt troponin elevation was due to demand ischemia.  He had elevated LFTs and underwent ERCP which showed retained CBD stone.  He had ERCP with sphincterectomy and LFTs trended down.  He was also found to have gastritis and duodenal ulcers without active bleeding treated with PPI.  He was seen in office on 06/19/2021 with Dr. Swaziland.  During physical in June EKG showed A-fib with rate of 87 and LBBB.  Eliquis was recommended for stroke prophylaxis but patient deferred.  Echo showed LVEF 45-50%, global hypokinesis, severe LAE.  Sherryll Burger was recommended but patient deferred.  He was seen in office by Dr. Swaziland on 03/11/2022.  EKG showed A-fib, rate 79 bpm, LBBB.  He had previously stopped statin secondary to myalgias, however it was discovered he had PMR.  It was recommended he restart statins.  Eliquis was once again recommended however patient deferred.  He was last seen in office on 06/16/2023.  He had noted a recent flare of his PMR with bilateral shoulder pain and difficulty reaching.  He had noted mildly increased DOE with heavier activities.  It was discussed repeating an echo however patient deferred at that time.  Furthermore he declined initiating Entresto and Eliquis during  visit.  He was to follow-up in 1 year.  He was seen by his PCP on 12/25/2023.  He presented for shortness of breath x 1 week and complaints of chest pain and pressure as well as mild edema.  EKG showed A-fib with RVR, 103 bpm.  Echocardiogram was ordered which showed LVEF 25-30%, global hypokinesis, left ventricular internal cavity size severely dilated, mild LVH, MR due to annular dilation, moderate to severe mitral valve regurgitation, tricuspid valve regurgitation is mild to moderate.  Chest x-ray showed mild diffuse bilateral interstitial thickening suspicious for mild pulmonary edema with bilateral pleural effusions.  He was started on Eliquis 5 mg twice daily and furosemide 20 mg daily.   Discussed the use of AI scribe software for clinical note transcription with the patient, who gave verbal consent to proceed.  The patient arrives to clinic today for an acute visit for abnormal echocardiogram.  He reports that he is feeling "well" and better than he did a month ago.  He reports shortness of breath that started approximately 1 month ago.  The shortness of breath was accompanied by 1 to 2 days of chest pain and pressure.  His shortness of breath has improved with the addition of furosemide.  He denies any leg swelling, orthopnea, PND.  He denies any SOB at rest however does continue to experience DOE.  He is without any weight gain.  Today he denies chest pain, lower extremity edema, fatigue, palpitations, melena, hematuria, hemoptysis, diaphoresis, weakness, presyncope, syncope, orthopnea, and PND.    Review  of systems:  Please see the history of present illness. All other systems are reviewed and otherwise negative.     Home Medications:    Current Meds  Medication Sig   amLODipine (NORVASC) 2.5 MG tablet TAKE 1 TABLET(2.5 MG) BY MOUTH DAILY   apixaban (ELIQUIS) 5 MG TABS tablet Take 1 tablet (5 mg total) by mouth 2 (two) times daily.   atorvastatin (LIPITOR) 20 MG tablet Take 1 tablet  (20 mg total) by mouth daily.   B Complex-C (B-COMPLEX WITH VITAMIN C) tablet Take 1 tablet by mouth daily.   brimonidine (ALPHAGAN P) 0.1 % SOLN    Cholecalciferol (VITAMIN D3) 50 MCG (2000 UT) CAPS Take 1 capsule (2,000 Units total) by mouth daily.   cyclobenzaprine (FLEXERIL) 5 MG tablet Take 1 tablet (5 mg total) by mouth 3 (three) times daily as needed for muscle spasms.   dorzolamide-timolol (COSOPT) 22.3-6.8 MG/ML ophthalmic solution Place 1 drop into the left eye 2 (two) times daily.   furosemide (LASIX) 20 MG tablet Take 1-2 tablets (20-40 mg total) by mouth daily.   metoprolol tartrate (LOPRESSOR) 25 MG tablet Take 0.5 tablets (12.5 mg total) by mouth daily.   predniSONE (DELTASONE) 1 MG tablet Take 3 tablets (3 mg total) by mouth daily with breakfast.   Studies Reviewed:   EKG Interpretation Date/Time:  Monday February 01 2024 14:09:03 EDT Ventricular Rate:  85 PR Interval:    QRS Duration:  162 QT Interval:  434 QTC Calculation: 516 R Axis:   27  Text Interpretation: Atrial fibrillation Left bundle branch block When compared with ECG of 16-Jun-2023 14:01, T wave inversion more evident in Inferior leads Confirmed by Rise Paganini [id number]) on 02/01/2024 4:45:36 PM    Echocardiogram 01/11/2024  1. Compared to echo from Sept 2022, LVEF is now severely depressed and  mitral regurgitation is more pronounced.   2. Left ventricular ejection fraction, by estimation, is 25 to 30%. The  left ventricle has severely decreased function. The left ventricle  demonstrates global hypokinesis. The left ventricular internal cavity size  was severely dilated. There is mild  concentric left ventricular hypertrophy. Left ventricular diastolic  parameters are indeterminate.   3. Right ventricular systolic function is normal. The right ventricular  size is normal.   4. Left atrial size was severely dilated.   5. MR due to annular dilitation. . The mitral valve is normal in  structure. Moderate to  severe mitral valve regurgitation.   6. Tricuspid valve regurgitation is mild to moderate.   7. The aortic valve is tricuspid. Aortic valve regurgitation is not  visualized. Aortic valve sclerosis/calcification is present, without any  evidence of aortic stenosis.   8. Right atrial size was moderately dilated.   Risk Assessment/Calculations:    CHA2DS2-VASc Score = 5   This indicates a 7.2% annual risk of stroke. The patient's score is based upon: CHF History: 1 HTN History: 1 Diabetes History: 0 Stroke History: 0 Vascular Disease History: 1 Age Score: 2 Gender Score: 0            Physical Exam:   VS:  BP 100/64 (BP Location: Right Arm, Patient Position: Sitting, Cuff Size: Normal)   Pulse 85   Ht 5\' 11"  (1.803 m)   Wt 200 lb (90.7 kg)   SpO2 96%   BMI 27.89 kg/m    Wt Readings from Last 3 Encounters:  02/01/24 200 lb (90.7 kg)  01/07/24 198 lb (89.8 kg)  12/25/23 214 lb (97.1 kg)  GEN: Well nourished, well developed in no acute distress NECK: No JVD; No carotid bruits CARDIAC: RRR, no murmurs, rubs, gallops RESPIRATORY:  Clear to auscultation without rales, wheezing or rhonchi  ABDOMEN: Soft, non-tender, non-distended EXTREMITIES:  No edema; No acute deformity     Assessment and Plan:  HFrEF Echocardiogram 01/11/2024 showed LVEF 25-30%, global hypokinesis, LV internal cavity size severely dilated, mild LVH, moderate to severe MR Echocardiogram 07/2021 with LVEF 45-50% - Today he is euvolemic and well compensated on exam - He notes DOE with minor activities however no signs of overt heart failure exacerbation at this time.  No SOB at rest, orthopnea, PND, leg swelling - Discussed case with DOD Dr. Herbie Baltimore regarding patient's acute severely decreased LV function and worsening MR.  We have agreed to set up patient for outpatient left heart catheterization for further ischemic evaluation.  - LHC in 2018 showed CTO of distal LCx and 60 to 70% ostial D1 but no clear  culprit as he was treated medically at that time - Patient would prefer to hold off on any GDMT titration at this time as he is not interested in starting any more medications today.  Has deferred GDMT in the past. - BMET and CBC today  - LHC scheduled for Tuesday, April 8 at 1030 with Dr. 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  There is mild to moderate tricuspid valve regurgitation His previous echocardiogram in 07/2021 showed just trivial MR - His annular dilation likely due to persistent atrial fibrillation - Pending results of LHC as noted above we will consider referral to structural heart team for further evaluation of his moderate to severe MR  Coronary artery disease LHC 07/2017 in the setting of lateral wall STEMI showed CTO of distal LCx, 60 to 70% ostial D1 but no clear culprit with normal LV function.  Patient was treated medically. - He is without any active anginal symptoms today - EKG today shows atrial fibrillation with chronic LBBB without acute ischemic changes - Cardiac catheterization ordered today scheduled for Tuesday, April 8 with Dr. Swaziland - Continue atorvastatin 20 mg daily and metoprolol tartrate 12.5 mg daily - Not on ASA given OAC  Persistent atrial fibrillation Onset June 2022 and he denies cardiac awareness of his arrhythmia EKG reviewed from PCP office on 12/25/2023 that showed atrial fibrillation with HR 103 bpm.  EKG today shows that he is in rate controlled atrial fibrillation with heart rate 85 bpm - He has been managed with rate control alone - Overall he is asymptomatic from an atrial fibrillation standpoint this time - Continue Eliquis 5 mg twice daily  LBBB Chronic - Depending on results from upcoming cardiac catheterization we will consider referral to EP for  consideration of ICD  Hypertension  Blood pressure today 100/64 and under adequate control - Continue current medication regimen  Hyperlipidemia, LDL goal <70 LDL 66 on 07/2022 He will need updated lipid panel on follow-up visit - Continue atorvastatin 20 mg daily          Informed Consent   Shared Decision Making/Informed Consent The risks [stroke (1 in 1000), death (1 in 1000), kidney failure [usually temporary] (1 in 500), bleeding (1 in 200), allergic reaction [possibly serious] (1 in 200)], benefits (diagnostic support and management of coronary artery disease) and alternatives of a  cardiac catheterization were discussed in detail with Dustin Clayton and he is willing to proceed.     Dispo:  Return in about 2 weeks (around 02/15/2024).  Signed, Denyce Robert, NP

## 2024-02-01 NOTE — Progress Notes (Unsigned)
 Cardiology Office Note:    Date:  02/02/2024  ID:  Dustin Clayton, DOB Jun 29, 1946, MRN [id number] PCP: Tresa Garter, MD  [facility] Providers Cardiologist:  Peter Swaziland, MD       Patient Profile:      Chief Complaint: Acute visit for abnormal echocardiogram History of Present Illness:  Dustin Clayton is a 78 y.o. male with visit-pertinent history of coronary artery disease, persistent A-fib, chronic HFmrEF, aortic root dilation, hypertension, hyperlipidemia, PMR  He was first evaluated by cardiology on 07/20/2017 for STEMI.  He was taken emergently to Cath Lab which showed CTO distal LCx, 60 to 70% ostial D1 stenosis but no clear culprit.  EF was normal by catheterization and echocardiogram.  It was felt troponin elevation was due to demand ischemia.  He had elevated LFTs and underwent ERCP which showed retained CBD stone.  He had ERCP with sphincterectomy and LFTs trended down.  He was also found to have gastritis and duodenal ulcers without active bleeding treated with PPI.  He was seen in office on 06/19/2021 with Dr. Swaziland.  During physical in June EKG showed A-fib with rate of 87 and LBBB.  Eliquis was recommended for stroke prophylaxis but patient deferred.  Echo showed LVEF 45-50%, global hypokinesis, severe LAE.  Sherryll Burger was recommended but patient deferred.  He was seen in office by Dr. Swaziland on 03/11/2022.  EKG showed A-fib, rate 79 bpm, LBBB.  He had previously stopped statin secondary to myalgias, however it was discovered he had PMR.  It was recommended he restart statins.  Eliquis was once again recommended however patient deferred.  He was last seen in office on 06/16/2023.  He had noted a recent flare of his PMR with bilateral shoulder pain and difficulty reaching.  He had noted mildly increased DOE with heavier activities.  It was discussed repeating an echo however patient deferred at that time.  Furthermore he declined initiating Entresto and Eliquis during  visit.  He was to follow-up in 1 year.  He was seen by his PCP on 12/25/2023.  He presented for shortness of breath x 1 week and complaints of chest pain and pressure as well as mild edema.  EKG showed A-fib with RVR, 103 bpm.  Echocardiogram was ordered which showed LVEF 25-30%, global hypokinesis, left ventricular internal cavity size severely dilated, mild LVH, MR due to annular dilation, moderate to severe mitral valve regurgitation, tricuspid valve regurgitation is mild to moderate.  Chest x-ray showed mild diffuse bilateral interstitial thickening suspicious for mild pulmonary edema with bilateral pleural effusions.  He was started on Eliquis 5 mg twice daily and furosemide 20 mg daily.   Discussed the use of AI scribe software for clinical note transcription with the patient, who gave verbal consent to proceed.  The patient arrives to clinic today for an acute visit for abnormal echocardiogram.  He reports that he is feeling "well" and better than he did a month ago.  He reports shortness of breath that started approximately 1 month ago.  The shortness of breath was accompanied by 1 to 2 days of chest pain and pressure.  His shortness of breath has improved with the addition of furosemide.  He denies any leg swelling, orthopnea, PND.  He denies any SOB at rest however does continue to experience DOE.  He is without any weight gain.  Today he denies chest pain, lower extremity edema, fatigue, palpitations, melena, hematuria, hemoptysis, diaphoresis, weakness, presyncope, syncope, orthopnea, and PND.    Review  of systems:  Please see the history of present illness. All other systems are reviewed and otherwise negative.     Home Medications:    Current Meds  Medication Sig   amLODipine (NORVASC) 2.5 MG tablet TAKE 1 TABLET(2.5 MG) BY MOUTH DAILY   apixaban (ELIQUIS) 5 MG TABS tablet Take 1 tablet (5 mg total) by mouth 2 (two) times daily.   atorvastatin (LIPITOR) 20 MG tablet Take 1 tablet  (20 mg total) by mouth daily.   B Complex-C (B-COMPLEX WITH VITAMIN C) tablet Take 1 tablet by mouth daily.   brimonidine (ALPHAGAN P) 0.1 % SOLN    Cholecalciferol (VITAMIN D3) 50 MCG (2000 UT) CAPS Take 1 capsule (2,000 Units total) by mouth daily.   cyclobenzaprine (FLEXERIL) 5 MG tablet Take 1 tablet (5 mg total) by mouth 3 (three) times daily as needed for muscle spasms.   dorzolamide-timolol (COSOPT) 22.3-6.8 MG/ML ophthalmic solution Place 1 drop into the left eye 2 (two) times daily.   furosemide (LASIX) 20 MG tablet Take 1-2 tablets (20-40 mg total) by mouth daily.   metoprolol tartrate (LOPRESSOR) 25 MG tablet Take 0.5 tablets (12.5 mg total) by mouth daily.   predniSONE (DELTASONE) 1 MG tablet Take 3 tablets (3 mg total) by mouth daily with breakfast.   Studies Reviewed:   EKG Interpretation Date/Time:  Monday February 01 2024 14:09:03 EDT Ventricular Rate:  85 PR Interval:    QRS Duration:  162 QT Interval:  434 QTC Calculation: 516 R Axis:   27  Text Interpretation: Atrial fibrillation Left bundle branch block When compared with ECG of 16-Jun-2023 14:01, T wave inversion more evident in Inferior leads Confirmed by Rise Paganini [phone]) on 02/01/2024 4:45:36 PM    Echocardiogram 01/11/2024  1. Compared to echo from Sept 2022, LVEF is now severely depressed and  mitral regurgitation is more pronounced.   2. Left ventricular ejection fraction, by estimation, is 25 to 30%. The  left ventricle has severely decreased function. The left ventricle  demonstrates global hypokinesis. The left ventricular internal cavity size  was severely dilated. There is mild  concentric left ventricular hypertrophy. Left ventricular diastolic  parameters are indeterminate.   3. Right ventricular systolic function is normal. The right ventricular  size is normal.   4. Left atrial size was severely dilated.   5. MR due to annular dilitation. . The mitral valve is normal in  structure. Moderate to  severe mitral valve regurgitation.   6. Tricuspid valve regurgitation is mild to moderate.   7. The aortic valve is tricuspid. Aortic valve regurgitation is not  visualized. Aortic valve sclerosis/calcification is present, without any  evidence of aortic stenosis.   8. Right atrial size was moderately dilated.   Risk Assessment/Calculations:    CHA2DS2-VASc Score = 5   This indicates a 7.2% annual risk of stroke. The patient's score is based upon: CHF History: 1 HTN History: 1 Diabetes History: 0 Stroke History: 0 Vascular Disease History: 1 Age Score: 2 Gender Score: 0            Physical Exam:   VS:  BP 100/64 (BP Location: Right Arm, Patient Position: Sitting, Cuff Size: Normal)   Pulse 85   Ht 5\' 11"  (1.803 m)   Wt 200 lb (90.7 kg)   SpO2 96%   BMI 27.89 kg/m    Wt Readings from Last 3 Encounters:  02/01/24 200 lb (90.7 kg)  01/07/24 198 lb (89.8 kg)  12/25/23 214 lb (97.1 kg)  GEN: Well nourished, well developed in no acute distress NECK: No JVD; No carotid bruits CARDIAC: RRR, no murmurs, rubs, gallops RESPIRATORY:  Clear to auscultation without rales, wheezing or rhonchi  ABDOMEN: Soft, non-tender, non-distended EXTREMITIES:  No edema; No acute deformity     Assessment and Plan:  HFrEF Echocardiogram 01/11/2024 showed LVEF 25-30%, global hypokinesis, LV internal cavity size severely dilated, mild LVH, moderate to severe MR Echocardiogram 07/2021 with LVEF 45-50% - Today he is euvolemic and well compensated on exam - He notes DOE with minor activities however no signs of overt heart failure exacerbation at this time.  No SOB at rest, orthopnea, PND, leg swelling - Discussed case with DOD Dr. Herbie Baltimore regarding patient's acute severely decreased LV function and worsening MR.  We have agreed to set up patient for outpatient left heart catheterization for further ischemic evaluation.  - LHC in 2018 showed CTO of distal LCx and 60 to 70% ostial D1 but no clear  culprit as he was treated medically at that time - Patient would prefer to hold off on any GDMT titration at this time as he is not interested in starting any more medications today.  Has deferred GDMT in the past. - BMET and CBC today  - LHC scheduled for Tuesday, April 8 at 1030 with Dr. 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  There is mild to moderate tricuspid valve regurgitation His previous echocardiogram in 07/2021 showed just trivial MR - His annular dilation likely due to persistent atrial fibrillation - Pending results of LHC as noted above we will consider referral to structural heart team for further evaluation of his moderate to severe MR  Coronary artery disease LHC 07/2017 in the setting of lateral wall STEMI showed CTO of distal LCx, 60 to 70% ostial D1 but no clear culprit with normal LV function.  Patient was treated medically. - He is without any active anginal symptoms today - EKG today shows atrial fibrillation with chronic LBBB without acute ischemic changes - Cardiac catheterization ordered today scheduled for Tuesday, April 8 with Dr. Swaziland - Continue atorvastatin 20 mg daily and metoprolol tartrate 12.5 mg daily - Not on ASA given OAC  Persistent atrial fibrillation Onset June 2022 and he denies cardiac awareness of his arrhythmia EKG reviewed from PCP office on 12/25/2023 that showed atrial fibrillation with HR 103 bpm.  EKG today shows that he is in rate controlled atrial fibrillation with heart rate 85 bpm - He has been managed with rate control alone - Overall he is asymptomatic from an atrial fibrillation standpoint this time - Continue Eliquis 5 mg twice daily  LBBB Chronic - Depending on results from upcoming cardiac catheterization we will consider referral to EP for  consideration of ICD  Hypertension  Blood pressure today 100/64 and under adequate control - Continue current medication regimen  Hyperlipidemia, LDL goal <70 LDL 66 on 07/2022 He will need updated lipid panel on follow-up visit - Continue atorvastatin 20 mg daily          Informed Consent   Shared Decision Making/Informed Consent The risks [stroke (1 in 1000), death (1 in 1000), kidney failure [usually temporary] (1 in 500), bleeding (1 in 200), allergic reaction [possibly serious] (1 in 200)], benefits (diagnostic support and management of coronary artery disease) and alternatives of a  cardiac catheterization were discussed in detail with Dustin Clayton and he is willing to proceed.     Dispo:  Return in about 2 weeks (around 02/15/2024).  Signed, Denyce Robert, NP

## 2024-02-01 NOTE — Patient Instructions (Signed)
   Testing/Procedures:   Cardiac Catheterization   You are scheduled for a Cardiac Catheterization on Tuesday, April 8 with Dr. Peter Swaziland.  1. Please arrive at the [facility] (Main Entrance A) at [facility]: [address] [place], [place] [address] at 8:30 AM (This time is 2 hour(s) before your procedure to ensure your preparation).   Free valet parking service is available. You will check in at ADMITTING. The support person will be asked to wait in the waiting room.  It is OK to have someone drop you off and come back when you are ready to be discharged.        Special note: Every effort is made to have your procedure done on time. Please understand that emergencies sometimes delay scheduled procedures.  2. Diet: Do not eat solid foods after midnight.  You may have clear liquids until 5 AM the day of the procedure.  3. Labs: You will need to have blood drawn on today  4. Medication instructions in preparation for your procedure:  DO NOT TAKE FUROSEMIDE THE MORNING OF THE PROCEDURE  TAKE THE LAST DOSE OF ELIQUIS FRIDAY 02/05/24 PM DOSE  On the morning of your procedure TAKE any morning medicines NOT listed above.  You may use sips of water.  5. Plan to go home the same day, you will only stay overnight if medically necessary. 6. You MUST have a responsible adult to drive you home. 7. An adult MUST be with you the first 24 hours after you arrive home. 8. Bring a current list of your medications, and the last time and date medication taken. 9. Bring ID and current insurance cards. 10.Please wear clothes that are easy to get on and off and wear slip-on shoes.  Thank you for allowing [place] to care for you!   -- [facility]   Follow-Up: At [facility], you and your health needs are our priority.  As part of our continuing mission to provide you with exceptional heart care, our providers are all part of one team.  This team includes  your primary Cardiologist (physician) and Advanced Practice Providers or APPs (Physician Assistants and Nurse Practitioners) who all work together to provide you with the care you need, when you need it.  Your next appointment:   2 week(s)  Provider:   Rise Paganini NP             1st Floor: - Lobby - Registration  - Pharmacy  - Lab - Cafe  2nd Floor: - PV Lab - Diagnostic Testing (echo, CT, nuclear med)  3rd Floor: - Vacant  4th Floor: - TCTS (cardiothoracic surgery) - AFib Clinic - Structural Heart Clinic - Vascular Surgery  - Vascular Ultrasound  5th Floor: - HeartCare Cardiology (general and EP) - Clinical Pharmacy for coumadin, hypertension, lipid, weight-loss medications, and med management appointments    Valet parking services will be available as well.

## 2024-02-02 ENCOUNTER — Encounter: Payer: Self-pay | Admitting: Emergency Medicine

## 2024-02-02 LAB — BASIC METABOLIC PANEL WITH GFR
BUN/Creatinine Ratio: 17 (ref 10–24)
BUN: 14 mg/dL (ref 8–27)
CO2: 24 mmol/L (ref 20–29)
Calcium: 9.5 mg/dL (ref 8.6–10.2)
Chloride: 99 mmol/L (ref 96–106)
Creatinine, Ser: 0.84 mg/dL (ref 0.76–1.27)
Glucose: 90 mg/dL (ref 70–99)
Potassium: 5.3 mmol/L — ABNORMAL HIGH (ref 3.5–5.2)
Sodium: 140 mmol/L (ref 134–144)
eGFR: 89 mL/min/{1.73_m2} (ref >59)

## 2024-02-02 LAB — CBC
Hematocrit: 42.9 % (ref 37.5–51.0)
Hemoglobin: 14.1 g/dL (ref 13.0–17.7)
MCH: 31.3 pg (ref 26.6–33.0)
MCHC: 32.9 g/dL (ref 31.5–35.7)
MCV: 95 fL (ref 79–97)
Platelets: 142 10*3/uL — ABNORMAL LOW (ref 150–450)
RBC: 4.51 x10E6/uL (ref 4.14–5.80)
RDW: 12.5 % (ref 11.6–15.4)
WBC: 6.2 10*3/uL (ref 3.4–10.8)

## 2024-02-03 ENCOUNTER — Encounter: Payer: Self-pay | Admitting: Internal Medicine

## 2024-02-08 ENCOUNTER — Telehealth: Payer: Self-pay | Admitting: *Deleted

## 2024-02-08 ENCOUNTER — Ambulatory Visit (INDEPENDENT_AMBULATORY_CARE_PROVIDER_SITE_OTHER): Payer: Medicare Other

## 2024-02-08 VITALS — BP 110/62 | HR 91 | Ht 71.0 in | Wt 191.8 lb

## 2024-02-08 DIAGNOSIS — Z0000 Encounter for general adult medical examination without abnormal findings: Secondary | ICD-10-CM

## 2024-02-08 NOTE — Progress Notes (Cosign Needed Addendum)
 Subjective:   Dustin Clayton is a 78 y.o. who presents for a Medicare Wellness preventive visit.  Visit Complete: In person  Persons Participating in Visit: Patient.  AWV Questionnaire: No: Patient Medicare AWV questionnaire was not completed prior to this visit.  Cardiac Risk Factors include: advanced age (>59men, >62 women);hypertension;dyslipidemia;male gender     Objective:    Today's Vitals   02/08/24 1313  BP: 110/62  Pulse: 91  Weight: 191 lb 12.8 oz (87 kg)  Height: 5\' 11"  (1.803 m)   Body mass index is 26.75 kg/m.     02/08/2024    1:12 PM 09/16/2022   10:33 AM 09/13/2021   10:27 AM 07/24/2017    7:02 AM 07/20/2017   10:00 PM 07/20/2017    8:14 PM  Advanced Directives  Does Patient Have a Medical Advance Directive? Yes No No No No No  Type of Estate agent of [place];Living will       Copy of Healthcare Power of Attorney in Chart? No - copy requested       Would patient like information on creating a medical advance directive?  No - Patient declined No - Patient declined No - Patient declined Yes (Inpatient - patient requests chaplain consult to create a medical advance directive)     Current Medications (verified) Outpatient Encounter Medications as of 02/08/2024  Medication Sig   amLODipine (NORVASC) 2.5 MG tablet TAKE 1 TABLET(2.5 MG) BY MOUTH DAILY   apixaban (ELIQUIS) 5 MG TABS tablet Take 1 tablet (5 mg total) by mouth 2 (two) times daily.   atorvastatin (LIPITOR) 20 MG tablet Take 1 tablet (20 mg total) by mouth daily.   cyclobenzaprine (FLEXERIL) 5 MG tablet Take 1 tablet (5 mg total) by mouth 3 (three) times daily as needed for muscle spasms.   dorzolamide-timolol (COSOPT) 22.3-6.8 MG/ML ophthalmic solution Place 1 drop into the left eye 2 (two) times daily.   furosemide (LASIX) 20 MG tablet Take 1-2 tablets (20-40 mg total) by mouth daily.   metoprolol tartrate (LOPRESSOR) 25 MG tablet Take 0.5 tablets (12.5 mg total) by mouth  daily.   predniSONE (DELTASONE) 1 MG tablet Take 3 tablets (3 mg total) by mouth daily with breakfast. (Patient taking differently: Take 3 mg by mouth daily as needed (pains).)   No facility-administered encounter medications on file as of 02/08/2024.    Allergies (verified) Other   History: Past Medical History:  Diagnosis Date   Acute cholangitis    Alcohol abuse    Arrhythmia    CAD (coronary artery disease), native coronary artery    Common bile duct calculus    Duodenal ulcer    Gastritis    HTN (hypertension)    Hypercholesterolemia    Thrombocytopenia (HCC)    Past Surgical History:  Procedure Laterality Date   APPENDECTOMY     ERCP N/A 07/24/2017   Procedure: ENDOSCOPIC RETROGRADE CHOLANGIOPANCREATOGRAPHY (ERCP);  Surgeon: Vida Rigger, MD;  Location: [facility] ENDOSCOPY;  Service: Endoscopy;  Laterality: N/A;   EYE SURGERY     LEFT HEART CATH AND CORONARY ANGIOGRAPHY N/A 07/20/2017   Procedure: LEFT HEART CATH AND CORONARY ANGIOGRAPHY;  Surgeon: Kathleene Hazel, MD;  Location: [facility] INVASIVE CV LAB;  Service: Cardiovascular;  Laterality: N/A;   TONSILLECTOMY     Family History  Problem Relation Age of Onset   Cancer Mother    Prostate cancer Brother    CAD Neg Hx    Social History   Socioeconomic History  Marital status: Married    Spouse name: Not on file   Number of children: 1   Years of education: Not on file   Highest education level: Doctorate  Occupational History   Not on file  Tobacco Use   Smoking status: Former    Current packs/day: 0.00    Types: Cigarettes    Quit date: 11/03/1984    Years since quitting: 39.2    Passive exposure: Past   Smokeless tobacco: Never  Vaping Use   Vaping status: Not on file  Substance and Sexual Activity   Alcohol use: Yes    Alcohol/week: 15.0 standard drinks of alcohol    Types: 14 Glasses of wine, 1 Shots of liquor per week   Drug use: No   Sexual activity: Yes  Other Topics Concern   Not on file  Social  History Narrative   Not on file   Social Drivers of Health   Financial Resource Strain: Low Risk  (02/08/2024)   Overall Financial Resource Strain (CARDIA)    Difficulty of Paying Living Expenses: Not hard at all  Food Insecurity: No Food Insecurity (02/08/2024)   Hunger Vital Sign    Worried About Running Out of Food in the Last Year: Never true    Ran Out of Food in the Last Year: Never true  Transportation Needs: No Transportation Needs (02/08/2024)   PRAPARE - Administrator, Civil Service (Medical): No    Lack of Transportation (Non-Medical): No  Physical Activity: Insufficiently Active (02/08/2024)   Exercise Vital Sign    Days of Exercise per Week: 3 days    Minutes of Exercise per Session: 10 min  Stress: No Stress Concern Present (02/08/2024)   [employer] of Occupational Health - Occupational Stress Questionnaire    Feeling of Stress : Not at all  Social Connections: Moderately Isolated (02/08/2024)   Social Connection and Isolation Panel [NHANES]    Frequency of Communication with Friends and Family: More than three times a week    Frequency of Social Gatherings with Friends and Family: Once a week    Attends Religious Services: Never    Database administrator or Organizations: No    Attends Engineer, structural: Never    Marital Status: Married    Tobacco Counseling Counseling given: No    Clinical Intake:     Pain : No/denies pain     BMI - recorded: 26.75 Nutritional Status: BMI 25 -29 Overweight Nutritional Risks: None Diabetes: No  Lab Results  Component Value Date   HGBA1C 5.8 11/18/2023   HGBA1C 5.5 08/18/2023   HGBA1C 5.5 04/21/2023     How often do you need to have someone help you when you read instructions, pamphlets, or other written materials from your doctor or pharmacy?: 1 - Never  Interpreter Needed?: No  Information entered by :: Hassell Halim, CMA   Activities of Daily Living     02/08/2024    1:15 PM  In  your present state of health, do you have any difficulty performing the following activities:  Hearing? 0  Vision? 0  Difficulty concentrating or making decisions? 0  Walking or climbing stairs? 0  Dressing or bathing? 0  Doing errands, shopping? 0  Preparing Food and eating ? N  Using the Toilet? N  In the past six months, have you accidently leaked urine? N  Do you have problems with loss of bowel control? N  Managing your Medications? N  Managing  your Finances? N  Housekeeping or managing your Housekeeping? N    Patient Care Team: Plotnikov, Georgina Quint, MD as PCP - General (Internal Medicine) [place], Peter M, MD as PCP - Cardiology (Cardiology) [place], Peter M, MD as Consulting Physician (Cardiology) Dominica Severin, MD as Consulting Physician (Orthopedic Surgery) Stephannie Li, MD as Consulting Physician (Ophthalmology) Janet Berlin, MD as Consulting Physician (Ophthalmology)  Indicate any recent Medical Services you may have received from other than [facility] providers in the past year (date may be approximate).     Assessment:   This is a routine wellness examination for Shirley.  Hearing/Vision screen Hearing Screening - Comments:: Denies hearing difficulties   Vision Screening - Comments:: Wears rx glasses - up to date with routine eye exams with Dr Allyne Gee and Dr Burgess Estelle   Goals Addressed   None    Depression Screen     02/08/2024    1:20 PM 11/18/2023   11:05 AM 08/18/2023   11:15 AM 04/21/2023   10:34 AM 01/21/2023   11:10 AM 10/22/2022    2:40 PM 09/16/2022   10:42 AM  PHQ 2/9 Scores  PHQ - 2 Score 0 0 0 0 0 0 0  PHQ- 9 Score 7     2     Fall Risk     02/08/2024    1:17 PM 11/18/2023   11:04 AM 08/18/2023   11:15 AM 04/21/2023   10:34 AM 01/21/2023   11:10 AM  Fall Risk   Falls in the past year? 0 0 0 0 0  Number falls in past yr: 0 0 0 0 0  Injury with Fall? 0 0 0 0 0  Risk for fall due to : No Fall Risks No Fall Risks No Fall Risks No Fall Risks No  Fall Risks  Follow up Falls prevention discussed;Falls evaluation completed Falls evaluation completed Falls evaluation completed Falls evaluation completed Falls evaluation completed    MEDICARE RISK AT HOME:  Medicare Risk at Home Any stairs in or around the home?: Yes If so, are there any without handrails?: No Home free of loose throw rugs in walkways, pet beds, electrical cords, etc?: Yes Adequate lighting in your home to reduce risk of falls?: Yes Life alert?: No Use of a cane, walker or w/c?: Yes (cane) Grab bars in the bathroom?: Yes Shower chair or bench in shower?: No Elevated toilet seat or a handicapped toilet?: No  TIMED UP AND GO:  Was the test performed?  No  Cognitive Function: 6CIT completed        02/08/2024    1:21 PM 09/16/2022   10:46 AM  6CIT Screen  What Year? 0 points 0 points  What month? 0 points 0 points  What time? 0 points 0 points  Count back from 20 0 points 0 points  Months in reverse 0 points 0 points  Repeat phrase 0 points 0 points  Total Score 0 points 0 points    Immunizations Immunization History  Administered Date(s) Administered   Fluad Quad(high Dose 65+) 10/22/2022   Fluad Trivalent(High Dose 65+) 01/20/2024   Influenza, High Dose Seasonal PF 07/06/2016, 07/06/2016, 11/30/2017, 09/27/2019   Moderna SARS-COV2 Booster Vaccination 10/05/2020   Moderna Sars-Covid-2 Vaccination 12/10/2019, 01/07/2020   Pneumococcal Conjugate-13 03/05/2018   Pneumococcal Polysaccharide-23 12/15/2011   Tdap 12/04/2010, 07/08/2016    Screening Tests Health Maintenance  Topic Date Due   COVID-19 Vaccine (4 - 2024-25 season) 07/05/2023   Medicare Annual Wellness (AWV)  02/07/2025   DTaP/Tdap/Td (  3 - Td or Tdap) 07/08/2026   Pneumonia Vaccine 22+ Years old  Completed   Hepatitis C Screening  Completed   HPV VACCINES  Aged Out   INFLUENZA VACCINE  Discontinued   Zoster Vaccines- Shingrix  Discontinued    Health Maintenance  Health  Maintenance Due  Topic Date Due   COVID-19 Vaccine (4 - 2024-25 season) 07/05/2023   Health Maintenance Items Addressed:02/08/2024   Additional Screening:  Vision Screening: Recommended annual ophthalmology exams for early detection of glaucoma and other disorders of the eye.  Dental Screening: Recommended annual dental exams for proper oral hygiene  Community Resource Referral / Chronic Care Management: CRR required this visit?    CCM required this visit?       Plan:     I have personally reviewed and noted the following in the patient's chart:   Medical and social history Use of alcohol, tobacco or illicit drugs  Current medications and supplements including opioid prescriptions. Patient is not currently taking opioid prescriptions. Functional ability and status Nutritional status Physical activity Advanced directives List of other physicians Hospitalizations, surgeries, and ER visits in previous 12 months Vitals Screenings to include cognitive, depression, and falls Referrals and appointments  In addition, I have reviewed and discussed with patient certain preventive protocols, quality metrics, and best practice recommendations. A written personalized care plan for preventive services as well as general preventive health recommendations were provided to patient.     Darreld Mclean, CMA   02/08/2024   After Visit Summary: (MyChart) Due to this being a telephonic visit, the after visit summary with patients personalized plan was offered to patient via MyChart   Notes: Nothing significant to report at this time.  Medical screening examination/treatment/procedure(s) were performed by non-physician practitioner and as supervising physician I was immediately available for consultation/collaboration.  I agree with above. Jacinta Shoe, MD

## 2024-02-08 NOTE — Telephone Encounter (Signed)
 Cardiac Catheterization scheduled at [facility] for: Tuesday February 09, 2024 10:30 AM Arrival time [facility] Main Entrance A at: 8:30 AM  Nothing to eat after midnight prior to procedure, clear liquids until 5 AM day of procedure.  Medication instructions: -Hold:  Eliquis-pt reports no Eliquis starting 02/06/24 and knows to hold until post procedure  Lasix-AM of procedure -Other usual morning medications can be taken with sips of water including aspirin 81 mg.  Plan to go home the same day, you will only stay overnight if medically necessary.  You must have responsible adult to drive you home.  Someone must be with you the first 24 hours after you arrive home.  Reviewed procedure instructions with patient.

## 2024-02-08 NOTE — Patient Instructions (Addendum)
 Mr. Dustin Clayton , Thank you for taking time to come for your Medicare Wellness Visit. I appreciate your ongoing commitment to your health goals. Please review the following plan we discussed and let me know if I can assist you in the future.   Referrals/Orders/Follow-Ups/Clinician Recommendations: Aim for 30 minutes of exercise or brisk walking, 6-8 glasses of water, and 5 servings of fruits and vegetables each day.   This is a list of the screening recommended for you and due dates:  Health Maintenance  Topic Date Due   COVID-19 Vaccine (4 - 2024-25 season) 07/05/2023   Medicare Annual Wellness Visit  02/07/2025   DTaP/Tdap/Td vaccine (3 - Td or Tdap) 07/08/2026   Pneumonia Vaccine  Completed   Hepatitis C Screening  Completed   HPV Vaccine  Aged Out   Flu Shot  Discontinued   Zoster (Shingles) Vaccine  Discontinued    Advanced directives: (Copy Requested) Please bring a copy of your health care power of attorney and living will to the office to be added to your chart at your convenience. You can mail to [facility] [address] [place], [place] [address] or email to [email or web address]  Next Medicare Annual Wellness Visit scheduled for next year: Yes

## 2024-02-09 ENCOUNTER — Ambulatory Visit (HOSPITAL_COMMUNITY)
Admission: RE | Admit: 2024-02-09 | Discharge: 2024-02-09 | Disposition: A | Discharge status: Home / Self Care | Attending: Cardiology | Admitting: Cardiology

## 2024-02-09 ENCOUNTER — Encounter (HOSPITAL_COMMUNITY): Payer: Self-pay | Admitting: Cardiology

## 2024-02-09 ENCOUNTER — Other Ambulatory Visit: Payer: Self-pay | Admitting: Internal Medicine

## 2024-02-09 ENCOUNTER — Other Ambulatory Visit: Payer: Self-pay

## 2024-02-09 ENCOUNTER — Encounter (HOSPITAL_COMMUNITY)
Admission: RE | Disposition: A | Discharge status: Home / Self Care | Payer: Self-pay | Source: Home / Self Care | Attending: Cardiology

## 2024-02-09 DIAGNOSIS — I110 Hypertensive heart disease with heart failure: Secondary | ICD-10-CM | POA: Insufficient documentation

## 2024-02-09 DIAGNOSIS — I5022 Chronic systolic (congestive) heart failure: Secondary | ICD-10-CM | POA: Insufficient documentation

## 2024-02-09 DIAGNOSIS — I252 Old myocardial infarction: Secondary | ICD-10-CM | POA: Diagnosis not present

## 2024-02-09 DIAGNOSIS — Z79899 Other long term (current) drug therapy: Secondary | ICD-10-CM | POA: Diagnosis not present

## 2024-02-09 DIAGNOSIS — I081 Rheumatic disorders of both mitral and tricuspid valves: Secondary | ICD-10-CM | POA: Insufficient documentation

## 2024-02-09 DIAGNOSIS — I509 Heart failure, unspecified: Secondary | ICD-10-CM

## 2024-02-09 DIAGNOSIS — E785 Hyperlipidemia, unspecified: Secondary | ICD-10-CM | POA: Diagnosis not present

## 2024-02-09 DIAGNOSIS — I2582 Chronic total occlusion of coronary artery: Secondary | ICD-10-CM | POA: Diagnosis not present

## 2024-02-09 DIAGNOSIS — I447 Left bundle-branch block, unspecified: Secondary | ICD-10-CM | POA: Diagnosis not present

## 2024-02-09 DIAGNOSIS — I2510 Atherosclerotic heart disease of native coronary artery without angina pectoris: Secondary | ICD-10-CM

## 2024-02-09 DIAGNOSIS — Z7901 Long term (current) use of anticoagulants: Secondary | ICD-10-CM | POA: Diagnosis not present

## 2024-02-09 DIAGNOSIS — I4819 Other persistent atrial fibrillation: Secondary | ICD-10-CM | POA: Diagnosis not present

## 2024-02-09 DIAGNOSIS — I519 Heart disease, unspecified: Secondary | ICD-10-CM

## 2024-02-09 HISTORY — PX: RIGHT/LEFT HEART CATH AND CORONARY ANGIOGRAPHY: CATH118266

## 2024-02-09 LAB — POCT I-STAT EG7
Acid-base deficit: 2 mmol/L (ref 0.0–2.0)
Acid-base deficit: 2 mmol/L (ref 0.0–2.0)
Bicarbonate: 23.5 mmol/L (ref 20.0–28.0)
Bicarbonate: 24 mmol/L (ref 20.0–28.0)
Calcium, Ion: 1.19 mmol/L (ref 1.15–1.40)
Calcium, Ion: 1.23 mmol/L (ref 1.15–1.40)
HCT: 39 % (ref 39.0–52.0)
HCT: 39 % (ref 39.0–52.0)
Hemoglobin: 13.3 g/dL (ref 13.0–17.0)
Hemoglobin: 13.3 g/dL (ref 13.0–17.0)
O2 Saturation: 63 %
O2 Saturation: 64 %
Potassium: 3.7 mmol/L (ref 3.5–5.1)
Potassium: 3.8 mmol/L (ref 3.5–5.1)
Sodium: 138 mmol/L (ref 135–145)
Sodium: 139 mmol/L (ref 135–145)
TCO2: 25 mmol/L (ref 22–32)
TCO2: 25 mmol/L (ref 22–32)
pCO2, Ven: 43.1 mmHg — ABNORMAL LOW (ref 44–60)
pCO2, Ven: 43.8 mmHg — ABNORMAL LOW (ref 44–60)
pH, Ven: 7.344 (ref 7.25–7.43)
pH, Ven: 7.347 (ref 7.25–7.43)
pO2, Ven: 34 mmHg (ref 32–45)
pO2, Ven: 35 mmHg (ref 32–45)

## 2024-02-09 LAB — POCT I-STAT 7, (LYTES, BLD GAS, ICA,H+H)
Acid-base deficit: 2 mmol/L (ref 0.0–2.0)
Bicarbonate: 23.7 mmol/L (ref 20.0–28.0)
Calcium, Ion: 1.23 mmol/L (ref 1.15–1.40)
HCT: 39 % (ref 39.0–52.0)
Hemoglobin: 13.3 g/dL (ref 13.0–17.0)
O2 Saturation: 95 %
Potassium: 3.7 mmol/L (ref 3.5–5.1)
Sodium: 140 mmol/L (ref 135–145)
TCO2: 25 mmol/L (ref 22–32)
pCO2 arterial: 42 mmHg (ref 32–48)
pH, Arterial: 7.359 (ref 7.35–7.45)
pO2, Arterial: 78 mmHg — ABNORMAL LOW (ref 83–108)

## 2024-02-09 SURGERY — RIGHT/LEFT HEART CATH AND CORONARY ANGIOGRAPHY
Anesthesia: LOCAL

## 2024-02-09 MED ORDER — SODIUM CHLORIDE 0.9 % IV SOLN: INTRAVENOUS | Status: DC

## 2024-02-09 MED ORDER — HEPARIN SODIUM (PORCINE) 1000 UNIT/ML IJ SOLN
INTRAMUSCULAR | Status: AC
  Filled 2024-02-09: qty 10

## 2024-02-09 MED ORDER — MIDAZOLAM HCL 2 MG/2ML IJ SOLN
INTRAMUSCULAR | Status: AC
  Filled 2024-02-09: qty 2

## 2024-02-09 MED ORDER — VERAPAMIL HCL 2.5 MG/ML IV SOLN
INTRAVENOUS | Status: AC
  Filled 2024-02-09: qty 2

## 2024-02-09 MED ORDER — VERAPAMIL HCL 2.5 MG/ML IV SOLN
INTRAVENOUS | Status: DC | PRN
  Administered 2024-02-09: 8 mL via INTRA_ARTERIAL

## 2024-02-09 MED ORDER — LIDOCAINE HCL (PF) 1 % IJ SOLN
INTRAMUSCULAR | Status: AC
  Filled 2024-02-09: qty 30

## 2024-02-09 MED ORDER — MIDAZOLAM HCL 2 MG/2ML IJ SOLN
INTRAMUSCULAR | Status: DC | PRN
Start: 2024-02-09 — End: 2024-02-09
  Administered 2024-02-09: 2 mg via INTRAVENOUS

## 2024-02-09 MED ORDER — LIDOCAINE HCL (PF) 1 % IJ SOLN
INTRAMUSCULAR | Status: DC | PRN
  Administered 2024-02-09 (×2): 2 mL

## 2024-02-09 MED ORDER — ASPIRIN 81 MG PO CHEW
81.0000 mg | CHEWABLE_TABLET | ORAL | Status: AC
  Administered 2024-02-09: 81 mg via ORAL
  Filled 2024-02-09: qty 1

## 2024-02-09 MED ORDER — FENTANYL CITRATE (PF) 100 MCG/2ML IJ SOLN
INTRAMUSCULAR | Status: AC
  Filled 2024-02-09: qty 2

## 2024-02-09 MED ORDER — ASPIRIN 81 MG PO CHEW: 81.0000 mg | CHEWABLE_TABLET | ORAL | Status: DC

## 2024-02-09 MED ORDER — IOHEXOL 350 MG/ML SOLN
INTRAVENOUS | Status: DC | PRN
  Administered 2024-02-09: 45 mL

## 2024-02-09 MED ORDER — HEPARIN SODIUM (PORCINE) 1000 UNIT/ML IJ SOLN
INTRAMUSCULAR | Status: DC | PRN
  Administered 2024-02-09: 4500 [IU] via INTRAVENOUS

## 2024-02-09 MED ORDER — FENTANYL CITRATE (PF) 100 MCG/2ML IJ SOLN
INTRAMUSCULAR | Status: DC | PRN
  Administered 2024-02-09: 25 ug via INTRAVENOUS

## 2024-02-09 MED ORDER — HEPARIN (PORCINE) IN NACL 1000-0.9 UT/500ML-% IV SOLN
INTRAVENOUS | Status: DC | PRN
  Administered 2024-02-09: 1000 mL

## 2024-02-09 SURGICAL SUPPLY — 10 items
CATH 5FR JL3.5 JR4 ANG PIG MP (CATHETERS) IMPLANT
CATH BALLN WEDGE 5F 110CM (CATHETERS) IMPLANT
DEVICE RAD COMP TR BAND LRG (VASCULAR PRODUCTS) IMPLANT
GLIDESHEATH SLEND SS 6F .021 (SHEATH) IMPLANT
GUIDEWIRE INQWIRE 1.5J.035X260 (WIRE) IMPLANT
INQWIRE 1.5J .035X260CM (WIRE) ×1 IMPLANT
PACK CARDIAC CATHETERIZATION (CUSTOM PROCEDURE TRAY) ×1 IMPLANT
SET ATX-X65L (MISCELLANEOUS) IMPLANT
SHEATH GLIDE SLENDER 4/5FR (SHEATH) IMPLANT
SHEATH PROBE COVER 6X72 (BAG) IMPLANT

## 2024-02-09 NOTE — Discharge Instructions (Addendum)
 May resume Eliquis this evening  Radial Site Care The following information offers guidance on how to care for yourself after your procedure. Your health care provider may also give you more specific instructions. If you have problems or questions, contact your health care provider. What can I expect after the procedure? After the procedure, it is common to have bruising and tenderness in the incision area. Follow these instructions at home: Incision site care  Follow instructions from your health care provider about how to take care of your incision site. Make sure you: Wash your hands with soap and water for at least 20 seconds before and after you change your bandage (dressing). If soap and water are not available, use hand sanitizer. Remove your dressing in 24 hours. Leave stitches (sutures), skin glue, or adhesive strips in place. These skin closures may need to stay in place for 2 weeks or longer. If adhesive strip edges start to loosen and curl up, you may trim the loose edges. Do not remove adhesive strips completely unless your health care provider tells you to do that. Do not take baths, swim, or use a hot tub for at least 1 week. You may shower 24 hours after the procedure or as told by your health care provider. Remove the dressing and gently wash the incision area with plain soap and water. Pat the area dry with a clean towel. Do not rub the site. That could cause bleeding. Do not apply powder or lotion to the site. Check your incision site every day for signs of infection. Check for: Redness, swelling, or pain. Fluid or blood. Warmth. Pus or a bad smell. Activity For 24 hours after the procedure, or as directed by your health care provider: Do not flex or bend the affected arm. Do not push or pull heavy objects with the affected arm. Do not operate machinery or power tools. Do not drive. You should not drive yourself home from the hospital or clinic if you go home during  that time period. You may drive 24 hours after the procedure unless your health care provider tells you not to. Do not lift anything that is heavier than 10 lb (4.5 kg), or the limit that you are told, until your health care provider says that it is safe. Return to your normal activities as told by your health care provider. Ask your health care provider what activities are safe for you and when you can return to work. If you were given a sedative during the procedure, it can affect you for several hours. Do not drive or operate machinery until your health care provider says that it is safe. General instructions Take over-the-counter and prescription medicines only as told by your health care provider. If you will be going home right after the procedure, plan to have a responsible adult care for you for the time you are told. This is important. Keep all follow-up visits. This is important. Contact a health care provider if: You have a fever or chills. You have any of these signs of infection at your incision site: Redness, swelling, or pain. Fluid or blood. Warmth. Pus or a bad smell. Get help right away if: The incision area swells very fast. The incision area is bleeding, and the bleeding does not stop when you hold steady pressure on the area. Your arm or hand becomes pale, cool, tingly, or numb. These symptoms may represent a serious problem that is an emergency. Do not wait to see if the  symptoms will go away. Get medical help right away. Call your local emergency services (911 in the [place] Do not drive yourself to the hospital. Summary After the procedure, it is common to have bruising and tenderness at the incision site. Follow instructions from your health care provider about how to take care of your radial site incision. Check the incision every day for signs of infection. Do not lift anything that is heavier than 10 lb (4.5 kg), or the limit that you are told, until your health care  provider says that it is safe. Get help right away if the incision area swells very fast, you have bleeding at the incision site that will not stop, or your arm or hand becomes pale, cool, or numb. This information is not intended to replace advice given to you by your health care provider. Make sure you discuss any questions you have with your health care provider. Document Revised: 12/09/2020 Document Reviewed: 12/09/2020 Elsevier Patient Education  2024 [employer].

## 2024-02-09 NOTE — Interval H&P Note (Signed)
 History and Physical Interval Note:  02/09/2024 10:25 AM  Dustin Clayton  has presented today for surgery, with the diagnosis of cardiomyopathy.  The various methods of treatment have been discussed with the patient and family. After consideration of risks, benefits and other options for treatment, the patient has consented to  Procedure(s): RIGHT/LEFT HEART CATH AND CORONARY ANGIOGRAPHY (N/A) as a surgical intervention.  The patient's history has been reviewed, patient examined, no change in status, stable for surgery.  I have reviewed the patient's chart and labs.  Questions were answered to the patient's satisfaction.   Cath Lab Visit (complete for each Cath Lab visit)  Clinical Evaluation Leading to the Procedure:   ACS: No.  Non-ACS:    Anginal Classification: CCS III  Anti-ischemic medical therapy: Maximal Therapy (2 or more classes of medications)  Non-Invasive Test Results: No non-invasive testing performed  Prior CABG: No previous CABG        Theron Arista [facility] 02/09/2024 10:25 AM

## 2024-02-10 ENCOUNTER — Telehealth: Payer: Self-pay | Admitting: *Deleted

## 2024-02-10 NOTE — Progress Notes (Unsigned)
 Complex Care Management Note Care Guide Note  02/10/2024 Name: Dustin Clayton MRN: [id number] DOB: 22-Jun-1946   Complex Care Management Outreach Attempts: An unsuccessful telephone outreach was attempted today to offer the patient information about available complex care management services.  Follow Up Plan:  Additional outreach attempts will be made to offer the patient complex care management information and services.   Encounter Outcome:  No Answer  Burman Nieves, CMA [place]  [facility], [facility] Guide Direct Dial: [phone]  Fax: [phone] Website: [email or web address]

## 2024-02-11 NOTE — Progress Notes (Unsigned)
 Complex Care Management Note Care Guide Note  02/11/2024 Name: Dustin Clayton MRN: [id number] DOB: 08-19-1946   Complex Care Management Outreach Attempts: A second unsuccessful outreach was attempted today to offer the patient with information about available complex care management services.  Follow Up Plan:  Additional outreach attempts will be made to offer the patient complex care management information and services.   Encounter Outcome:  No Answer  Burman Nieves, CMA [place]  [facility], [facility] Guide Direct Dial: [phone]  Fax: [phone] Website: [email or web address]

## 2024-02-12 NOTE — Progress Notes (Signed)
 Complex Care Management Note Care Guide Note  02/12/2024 Name: Dustin Clayton MRN: [id number] DOB: 21-Oct-1946   Complex Care Management Outreach Attempts: A third unsuccessful outreach was attempted today to offer the patient with information about available complex care management services.  Follow Up Plan:  No further outreach attempts will be made at this time. We have been unable to contact the patient to offer or enroll patient in complex care management services.  Encounter Outcome:  No Answer  Dustin Clayton, CMA [place]  [facility], [facility] Guide Direct Dial: [phone]  Fax: [phone] Website: [email or web address]

## 2024-02-14 ENCOUNTER — Encounter: Payer: Self-pay | Admitting: Internal Medicine

## 2024-02-24 ENCOUNTER — Encounter: Payer: Self-pay | Admitting: Emergency Medicine

## 2024-02-24 ENCOUNTER — Ambulatory Visit: Attending: Emergency Medicine | Admitting: Emergency Medicine

## 2024-02-24 VITALS — BP 110/62 | HR 90 | Ht 71.0 in | Wt 195.0 lb

## 2024-02-24 DIAGNOSIS — I5020 Unspecified systolic (congestive) heart failure: Secondary | ICD-10-CM | POA: Diagnosis present

## 2024-02-24 DIAGNOSIS — I447 Left bundle-branch block, unspecified: Secondary | ICD-10-CM | POA: Diagnosis present

## 2024-02-24 DIAGNOSIS — I10 Essential (primary) hypertension: Secondary | ICD-10-CM | POA: Diagnosis present

## 2024-02-24 DIAGNOSIS — I340 Nonrheumatic mitral (valve) insufficiency: Secondary | ICD-10-CM | POA: Diagnosis present

## 2024-02-24 DIAGNOSIS — I2510 Atherosclerotic heart disease of native coronary artery without angina pectoris: Secondary | ICD-10-CM

## 2024-02-24 DIAGNOSIS — I4819 Other persistent atrial fibrillation: Secondary | ICD-10-CM | POA: Diagnosis present

## 2024-02-24 DIAGNOSIS — E782 Mixed hyperlipidemia: Secondary | ICD-10-CM

## 2024-02-24 MED ORDER — LOSARTAN POTASSIUM 25 MG PO TABS: 12.5000 mg | ORAL_TABLET | Freq: Every day | ORAL | 2 refills | Status: DC

## 2024-02-24 MED ORDER — METOPROLOL SUCCINATE ER 25 MG PO TB24: 12.5000 mg | ORAL_TABLET | Freq: Every day | ORAL | 2 refills | Status: DC

## 2024-02-24 NOTE — Patient Instructions (Signed)
 Medication Instructions:  STOP TAKING METOPROLOL  TARTRATE. STOP TAKING AMLODIPINE . START TAKING METOPROLOL  SUCCINATE 12.5 MG (1/2 TABLET) DAILY. START TAKING LOSARTAN  12.5 MG (1/2 TABLET)DAILY.   Lab Work: BMET TO BE DONE TODAY. REPEAT BMET TO BE DONE IN 2 WEEKS.  Testing/Procedures: NONE  Follow-Up: At [facility], you and your health needs are our priority.  As part of our continuing mission to provide you with exceptional heart care, our providers are all part of one team.  This team includes your primary Cardiologist (physician) and Advanced Practice Providers or APPs (Physician Assistants and Nurse Practitioners) who all work together to provide you with the care you need, when you need it.  Your next appointment:   1 MONTH  Provider:   Peter [place], MD  OR Palmer Bobo, DNP   We recommend signing up for the patient portal called "MyChart".  Sign up information is provided on this After Visit Summary.  MyChart is used to connect with patients for Virtual Visits (Telemedicine).  Patients are able to view lab/test results, encounter notes, upcoming appointments, etc.  Non-urgent messages can be sent to your provider as well.   To learn more about what you can do with MyChart, go to [email or web address].   Other Instructions:    1st Floor: - Lobby - Registration  - Pharmacy  - Lab - Cafe  2nd Floor: - PV Lab - Diagnostic Testing (echo, CT, nuclear med)  3rd Floor: - Vacant  4th Floor: - TCTS (cardiothoracic surgery) - AFib Clinic - Structural Heart Clinic - Vascular Surgery  - Vascular Ultrasound  5th Floor: - HeartCare Cardiology (general and EP) - Clinical Pharmacy for coumadin, hypertension, lipid, weight-loss medications, and med management appointments    Valet parking services will be available as well.

## 2024-02-24 NOTE — Progress Notes (Signed)
 Cardiology Office Note:    Date:  02/24/2024  ID:  Dustin Clayton, DOB 1946-09-16, MRN [id number] PCP: Genia Kettering, MD  [facility] Providers Cardiologist:  Peter Swaziland, MD       Patient Profile:      Chief Complaint: 1 month follow-up for HFrEF and postcardiac catheterization History of Present Illness:  Dustin Clayton is a 78 y.o. male with visit-pertinent history of coronary artery disease, persistent A-fib, chronic HFmrEF, aortic root dilation, hypertension, hyperlipidemia, PMR   He was first evaluated by cardiology on 07/20/2017 for STEMI.  He was taken emergently to Cath Lab which showed CTO distal LCx, 60 to 70% ostial D1 stenosis but no clear culprit.  EF was normal by catheterization and echocardiogram.  It was felt troponin elevation was due to demand ischemia.  He had elevated LFTs and underwent ERCP which showed retained CBD stone.  He had ERCP with sphincterectomy and LFTs trended down.  He was also found to have gastritis and duodenal ulcers without active bleeding treated with PPI.   He was seen in office on 06/19/2021 with Dr. Swaziland.  During physical in June EKG showed A-fib with rate of 87 and LBBB.  Eliquis  was recommended for stroke prophylaxis but patient deferred.  Echo showed LVEF 45-50%, global hypokinesis, severe LAE.  Entresto  was recommended but patient deferred.   He was seen in office by Dr. Swaziland on 03/11/2022.  EKG showed A-fib, rate 79 bpm, LBBB.  He had previously stopped statin secondary to myalgias, however it was discovered he had PMR.  It was recommended he restart statins.  Eliquis  was once again recommended however patient deferred.   He was seen in office on 06/16/2023.  He had noted a recent flare of his PMR with bilateral shoulder pain and difficulty reaching.  He had noted mildly increased DOE with heavier activities.  It was discussed repeating an echo however patient deferred at that time.  Furthermore he declined initiating Entresto   and Eliquis  during visit.  He was to follow-up in 1 year.   He was seen by his PCP on 12/25/2023.  He presented for shortness of breath x 1 week and complaints of chest pain and pressure as well as mild edema.  EKG showed A-fib with RVR, 103 bpm.  Echocardiogram was ordered which showed LVEF 25-30%, global hypokinesis, left ventricular internal cavity size severely dilated, mild LVH, MR due to annular dilation, moderate to severe mitral valve regurgitation, tricuspid valve regurgitation is mild to moderate.  Chest x-ray showed mild diffuse bilateral interstitial thickening suspicious for mild pulmonary edema with bilateral pleural effusions.  He was started on Eliquis  5 mg twice daily and furosemide  20 mg daily.  He was last seen in clinic on 02/01/2024.  The chest pain had resolved however shortness of breath continued but was improving.  Right and left cardiac catheterization was ordered and completed on 02/09/2024 showing single-vessel occlusive CAD with 100% distal LCx, no angiographic change since 2018.  First diagonal lesion 60% stenosed, mildly elevated LV filling pressure with LVEDP 18 mmHg and normal right heart pressures.  Plan was to optimize medical therapy.   Discussed the use of AI scribe software for clinical note transcription with the patient, who gave verbal consent to proceed.  History of Present Illness Dustin Clayton, presents to clinic today with his wife and notes that he is doing well overall.  He continues to deny any further chest pains or exertional angina.  He notes ongoing dyspnea on exertion that started  approximately a year ago.  The shortness of breath is particularly noticeable during physical activities such as walking or doing chores. Dustin Clayton reports that this symptom has been stable over the past year and is not currently affecting his daily activities. Despite this, he reports feeling generally well and denies SOB when at rest as well as orthopnea, PND, leg swelling.  He brings in  his BP/HR log from home.  Both his blood pressure and heart rates are well-controlled at home.  He also notes his weights have been stable at home.  He denies chest pain, lower extremity edema, fatigue, palpitations, melena, hematuria, hemoptysis, diaphoresis, weakness, presyncope, syncope, orthopnea, and PND.   Review of systems:  Please see the history of present illness. All other systems are reviewed and otherwise negative.     Home Medications:    Current Meds  Medication Sig   apixaban  (ELIQUIS ) 5 MG TABS tablet Take 1 tablet (5 mg total) by mouth 2 (two) times daily.   atorvastatin  (LIPITOR ) 20 MG tablet Take 1 tablet (20 mg total) by mouth daily.   brimonidine (ALPHAGAN P) 0.1 % SOLN Place into the left eye daily.   cyclobenzaprine  (FLEXERIL ) 5 MG tablet Take 1 tablet (5 mg total) by mouth 3 (three) times daily as needed for muscle spasms.   dorzolamide-timolol (COSOPT) 22.3-6.8 MG/ML ophthalmic solution Place 1 drop into the left eye 2 (two) times daily.   furosemide  (LASIX ) 20 MG tablet Take 1-2 tablets (20-40 mg total) by mouth daily.   losartan  (COZAAR ) 25 MG tablet Take 0.5 tablets (12.5 mg total) by mouth daily.   metoprolol  succinate (TOPROL -XL) 25 MG 24 hr tablet Take 0.5 tablets (12.5 mg total) by mouth daily.   [DISCONTINUED] amLODipine  (NORVASC ) 2.5 MG tablet TAKE 1 TABLET(2.5 MG) BY MOUTH DAILY   [DISCONTINUED] metoprolol  tartrate (LOPRESSOR ) 25 MG tablet Take 0.5 tablets (12.5 mg total) by mouth daily.   Studies Reviewed:       Right/Left cardiac catheterization    1st Diag lesion is 60% stenosed.   Mid Cx lesion is 100% stenosed.   LV end diastolic pressure is mildly elevated.   Single vessel occlusive CAD. 100% distal LCx. No angiographic change since 2018 Mildly elevated LV filling pressure. LVEDP 18 mm Hg. PCWP 20/19, mean 18 mm Hg. V waves are not prominent Normal right heart pressures. PAP 31/12, mean 19 mm Hg.  Cardiac output 4.64 L/min, index  2.24 Diagnostic Dominance: Right   Echocardiogram 01/11/2024  1. Compared to echo from Sept 2022, LVEF is now severely depressed and  mitral regurgitation is more pronounced.   2. Left ventricular ejection fraction, by estimation, is 25 to 30%. The  left ventricle has severely decreased function. The left ventricle  demonstrates global hypokinesis. The left ventricular internal cavity size  was severely dilated. There is mild  concentric left ventricular hypertrophy. Left ventricular diastolic  parameters are indeterminate.   3. Right ventricular systolic function is normal. The right ventricular  size is normal.   4. Left atrial size was severely dilated.   5. MR due to annular dilitation. . The mitral valve is normal in  structure. Moderate to severe mitral valve regurgitation.   6. Tricuspid valve regurgitation is mild to moderate.   7. The aortic valve is tricuspid. Aortic valve regurgitation is not  visualized. Aortic valve sclerosis/calcification is present, without any  evidence of aortic stenosis.   8. Right atrial size was moderately dilated.  Risk Assessment/Calculations:    CHA2DS2-VASc Score =  5   This indicates a 7.2% annual risk of stroke. The patient's score is based upon: CHF History: 1 HTN History: 1 Diabetes History: 0 Stroke History: 0 Vascular Disease History: 1 Age Score: 2 Gender Score: 0            Physical Exam:   VS:  BP 110/62 (BP Location: Left Arm, Patient Position: Sitting, Cuff Size: Normal)   Pulse 90   Ht 5\' 11"  (1.803 m)   Wt 195 lb (88.5 kg)   SpO2 96%   BMI 27.20 kg/m    Wt Readings from Last 3 Encounters:  02/24/24 195 lb (88.5 kg)  02/09/24 190 lb (86.2 kg)  02/08/24 191 lb 12.8 oz (87 kg)    GEN: Well nourished, well developed in no acute distress NECK: No JVD; No carotid bruits CARDIAC: RRR, no murmurs, rubs, gallops RESPIRATORY:  Clear to auscultation without rales, wheezing or rhonchi  ABDOMEN: Soft, non-tender,  non-distended EXTREMITIES:  No edema; No acute deformity     Assessment and Plan:  HFrEF Echocardiogram 01/11/2024 showed LVEF 25-30% Echocardiogram 07/2021 with LVEF 45-50% Cardiac catheterization 02/09/2024 with single-vessel occlusive CAD, 100% distal LCx, no angiographic changes 2018. With normal right heart pressures. Plan to optimize medical therapy - Today he is euvolemic and well compensated on exam.  NYHA class II - He notes DOE with minor activities however no signs of overt heart failure exacerbation at this time.  No SOB at rest, orthopnea, PND, leg swelling - Plan to discontinue metoprolol  tartrate and transition patient to metoprolol  XL 12.5 mg daily.  Also plan to discontinue amlodipine  and start patient on losartan  12.5 mg daily.  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  There is mild to moderate tricuspid valve regurgitation His previous echocardiogram in 07/2021 showed just trivial MR - His annular dilation likely due to persistent atrial fibrillation - Will reassess with echocardiogram after GDMT has been optimized   Coronary artery disease LHC 07/2017 in the setting of lateral wall STEMI showed CTO of distal LCx, 60 to 70% ostial D1. Patient was treated medically. [facility] 02/2024 with no angiographic change since 2018 - He is without any active anginal symptoms today, no indication for further ischemic evaluation at this time - Continue atorvastatin  20 mg daily.  Start metoprolol  XL 12.5 mg daily as noted above - Not on ASA given OAC   Persistent atrial fibrillation Onset June 2022 and he denies any cardiac  awareness of his arrhythmia He has been managed with rate control alone - Today he is rate controlled with heart rate 90 bpm - Continue Eliquis  5 mg twice daily, no bleeding complaints, adequately dosed   LBBB Chronic - Once GDMT is optimized and echocardiogram repeated, if EF is still less than 35% would consider EP referral for ICD/BiV resynchronization    Hypertension  Blood pressure today 110/62 and under adequate control - Continue current medication regimen   Hyperlipidemia, LDL goal <70 LDL 66 on 07/2022 He will need updated lipid panel on follow-up visit - Continue atorvastatin  20 mg daily      Dispo:  Return in about 1 month (around  03/25/2024).  Signed, Ava Boatman, NP

## 2024-02-25 LAB — BASIC METABOLIC PANEL WITH GFR
BUN/Creatinine Ratio: 12 (ref 10–24)
BUN: 9 mg/dL (ref 8–27)
CO2: 22 mmol/L (ref 20–29)
Calcium: 9.3 mg/dL (ref 8.6–10.2)
Chloride: 104 mmol/L (ref 96–106)
Creatinine, Ser: 0.73 mg/dL — ABNORMAL LOW (ref 0.76–1.27)
Glucose: 92 mg/dL (ref 70–99)
Potassium: 4.9 mmol/L (ref 3.5–5.2)
Sodium: 141 mmol/L (ref 134–144)
eGFR: 93 mL/min/{1.73_m2} (ref >59)

## 2024-03-25 ENCOUNTER — Ambulatory Visit: Attending: Emergency Medicine | Admitting: Emergency Medicine

## 2024-03-25 ENCOUNTER — Encounter: Payer: Self-pay | Admitting: Emergency Medicine

## 2024-03-25 VITALS — BP 100/70 | HR 85 | Ht 71.0 in | Wt 197.8 lb

## 2024-03-25 DIAGNOSIS — I4819 Other persistent atrial fibrillation: Secondary | ICD-10-CM

## 2024-03-25 DIAGNOSIS — I447 Left bundle-branch block, unspecified: Secondary | ICD-10-CM | POA: Diagnosis present

## 2024-03-25 DIAGNOSIS — I5020 Unspecified systolic (congestive) heart failure: Secondary | ICD-10-CM | POA: Diagnosis present

## 2024-03-25 DIAGNOSIS — I10 Essential (primary) hypertension: Secondary | ICD-10-CM

## 2024-03-25 DIAGNOSIS — I340 Nonrheumatic mitral (valve) insufficiency: Secondary | ICD-10-CM | POA: Diagnosis present

## 2024-03-25 DIAGNOSIS — E782 Mixed hyperlipidemia: Secondary | ICD-10-CM | POA: Diagnosis present

## 2024-03-25 DIAGNOSIS — I2510 Atherosclerotic heart disease of native coronary artery without angina pectoris: Secondary | ICD-10-CM

## 2024-03-25 DIAGNOSIS — E785 Hyperlipidemia, unspecified: Secondary | ICD-10-CM | POA: Diagnosis present

## 2024-03-25 MED ORDER — DAPAGLIFLOZIN PROPANEDIOL 10 MG PO TABS
10.0000 mg | ORAL_TABLET | Freq: Every day | ORAL | 1 refills | Status: DC
Start: 2024-03-25 — End: 2024-09-28

## 2024-03-25 NOTE — Progress Notes (Signed)
 Cardiology Office Note:    Date:  03/25/2024  ID:  Dustin Clayton, DOB 23-Sep-1946, MRN [id number] PCP: Genia Kettering, MD  [facility] Providers Cardiologist:  Peter Swaziland, MD       Patient Profile:       Chief Complaint: 1 month follow-up for HFrEF History of Present Illness:  Dustin Clayton is a 78 y.o. male with visit-pertinent history of coronary artery disease, persistent A-fib, chronic HFrEF, aortic root dilation, hypertension, hyperlipidemia, PMR   He was first evaluated by cardiology on 07/20/2017 for STEMI.  He was taken emergently to Cath Lab which showed CTO distal LCx, 60 to 70% ostial D1 stenosis but no clear culprit.  EF was normal by catheterization and echocardiogram.  It was felt troponin elevation was due to demand ischemia.  He had elevated LFTs and underwent ERCP which showed retained CBD stone.  He had ERCP with sphincterectomy and LFTs trended down.  He was also found to have gastritis and duodenal ulcers without active bleeding treated with PPI.   He was seen in office on 06/19/2021 with Dr. Swaziland.  During physical in June EKG showed A-fib with rate of 87 and LBBB.  Eliquis  was recommended for stroke prophylaxis but patient deferred.  Echo showed LVEF 45-50%, global hypokinesis, severe LAE.  Entresto  was recommended but patient deferred.   He was seen in office by Dr. Swaziland on 03/11/2022.  EKG showed A-fib, rate 79 bpm, LBBB.  He had previously stopped statin secondary to myalgias, however it was discovered he had PMR.  It was recommended he restart statins.  Eliquis  was once again recommended however patient deferred.   He was seen by his PCP on 12/25/2023.  He presented for shortness of breath x 1 week and complaints of chest pain and pressure as well as mild edema.  EKG showed A-fib with HR 103 bpm.  Echocardiogram was ordered which showed LVEF 25-30%, global hypokinesis, left ventricular internal cavity size severely dilated, mild LVH, MR due to annular  dilation, moderate to severe mitral valve regurgitation, tricuspid valve regurgitation is mild to moderate.  Chest x-ray showed mild diffuse bilateral interstitial thickening suspicious for mild pulmonary edema with bilateral pleural effusions.  He was started on Eliquis  5 mg twice daily and furosemide  20 mg daily.   He was seen in clinic on 02/01/2024.  The chest pain had resolved however shortness of breath continued but was improving.  Right and left cardiac catheterization was ordered and completed on 02/09/2024 showing single-vessel occlusive CAD with 100% distal LCx, no angiographic change since 2018.  First diagonal lesion 60% stenosed, mildly elevated LV filling pressure with LVEDP 18 mmHg and normal right heart pressures.  Plan was to optimize medical therapy.  Last seen in clinic on 02/24/2024.  [name] without chest pains.  He noted ongoing DOE was unchanged.  His metoprolol  tartrate was transition to metoprolol  XL 12.5 mg daily.  His amlodipine  was discontinued and he was started on losartan  12.5 mg daily.  Changes were made in order to optimize current GDMT.  He was to follow-up in 1 month for further GDMT titration.   Discussed the use of AI scribe software for clinical note transcription with the patient, who gave verbal consent to proceed.  History of Present Illness Dustin Clayton "Maximiano Spanish" is an 78 year old male with heart failure who presents for follow-up.  Today patient is without any acute cardiovascular concerns or complaints.  He denies any chest pains or any exertional  symptoms.  Denies any SOB at rest, orthopnea, PND.  He remains cardiac unaware of his arrhythmia.  Notes chronic DOE is unchanged and may have slightly improved.  Reports some mild lower extremity swelling.  He is currently taking his Lasix  as needed usually takes 20 mg pill every other day.  He does weigh himself daily with his average weights ranging from 191-200 lbs.  His home blood pressures aligns with  today's measurement of 100/70.  He denies any presyncope, syncope, lightheadedness, dizziness.  Weight has increased from 191 to 197 pounds. He attributes weight fluctuations to Lasix , which he takes every other day or as needed, noting weight decreases with consistent use. No palpitations, syncope, or significant edema, though some leg swelling occurs when Lasix  is not taken regularly.  He inquires about the safety of flying due to family in [place] and [place].  His daughter helps with his medication management.  Review of systems:  Please see the history of present illness. All other systems are reviewed and otherwise negative.      Studies Reviewed:        Right/Left cardiac catheterization    1st Diag lesion is 60% stenosed.   Mid Cx lesion is 100% stenosed.   LV end diastolic pressure is mildly elevated.   Single vessel occlusive CAD. 100% distal LCx. No angiographic change since 2018 Mildly elevated LV filling pressure. LVEDP 18 mm Hg. PCWP 20/19, mean 18 mm Hg. V waves are not prominent Normal right heart pressures. PAP 31/12, mean 19 mm Hg.  Cardiac output 4.64 L/min, index 2.24 Diagnostic Dominance: Right    Echocardiogram 01/11/2024  1. Compared to echo from Sept 2022, LVEF is now severely depressed and  mitral regurgitation is more pronounced.   2. Left ventricular ejection fraction, by estimation, is 25 to 30%. The  left ventricle has severely decreased function. The left ventricle  demonstrates global hypokinesis. The left ventricular internal cavity size  was severely dilated. There is mild  concentric left ventricular hypertrophy. Left ventricular diastolic  parameters are indeterminate.   3. Right ventricular systolic function is normal. The right ventricular  size is normal.   4. Left atrial size was severely dilated.   5. MR due to annular dilitation. . The mitral valve is normal in  structure. Moderate to severe mitral valve regurgitation.   6. Tricuspid  valve regurgitation is mild to moderate.   7. The aortic valve is tricuspid. Aortic valve regurgitation is not  visualized. Aortic valve sclerosis/calcification is present, without any  evidence of aortic stenosis.   8. Right atrial size was moderately dilated.   Risk Assessment/Calculations:    CHA2DS2-VASc Score = 5   This indicates a 7.2% annual risk of stroke. The patient's score is based upon: CHF History: 1 HTN History: 1 Diabetes History: 0 Stroke History: 0 Vascular Disease History: 1 Age Score: 2 Gender Score: 0             Physical Exam:   VS:  BP 100/70 (BP Location: Left Arm, Patient Position: Sitting, Cuff Size: Normal)   Pulse 85   Ht 5\' 11"  (1.803 m)   Wt 197 lb 12.8 oz (89.7 kg)   SpO2 96%   BMI 27.59 kg/m    Wt Readings from Last 3 Encounters:  03/25/24 197 lb 12.8 oz (89.7 kg)  02/24/24 195 lb (88.5 kg)  02/09/24 190 lb (86.2 kg)    GEN: Well nourished, well developed in no acute distress NECK: No JVD; No carotid  bruits CARDIAC: Irregularly irregular rhythm, no murmurs, rubs, gallops RESPIRATORY:  Clear to auscultation without rales, wheezing or rhonchi  ABDOMEN: Soft, non-tender, non-distended EXTREMITIES: 1+ bilateral lower extremity edema; No acute deformity      Assessment and Plan:  HFrEF Echocardiogram 01/11/2024 showed LVEF 25-30% Echocardiogram 07/2021 with LVEF 45-50% Cardiac catheterization 02/09/2024 with single-vessel occlusive CAD, 100% distal LCx, no angiographic changes 2018. With normal right heart pressures. Plan to optimize medical therapy - Today he is euvolemic and well compensated on exam.  NYHA class II.  He is with 1+ bilateral lower extremity edema.  Currently taking his loop diuretic therapy every other day.  Stable DOE with no SOB at rest, orthopnea, PND. - Plan to start Farxiga 10 mg daily to further optimize GDMT.  Continue metoprolol  XL 12.5 mg daily and losartan  12.5 mg daily.  Further GDMT titration limited at this time  due to BP 100/70 - Continue Lasix  20 mg as needed - Plan for BMET x 2 weeks - Plan for repeat echocardiogram x 3 months as GDMT has been optimized.  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  There is mild to moderate tricuspid valve regurgitation His previous echocardiogram in 07/2021 showed trivial MR - His annular dilation likely due to persistent atrial fibrillation - Overall he remains stable with NYHA class II symptoms with mild DOE.  No SOB at rest, orthopnea, PND - Repeat echocardiogram x 3 months as noted above  Coronary artery disease LHC 07/2017 in the setting of lateral wall STEMI showed CTO of distal LCx, 60 to 70% ostial D1. Patient was treated medically. [facility] 02/2024 with no angiographic change since 2018 - He is without any active anginal symptoms today, no indication for further ischemic evaluation at this time - Continue atorvastatin  20 mg daily - Not on ASA given OAC  Persistent atrial fibrillation Onset June 2022 and he denies any cardiac awareness of his arrhythmia He has been managed with rate control alone - Today he is rate controlled with heart rate 85 bpm - Continue Eliquis  5 mg twice daily, no bleeding complaints, adequately dosed - Continue metoprolol  XL 12.5 mg daily   LBBB Chronic   Hypertension  Blood pressure today is 100/70 and under adequate control - Continue current medication regimen   Hyperlipidemia, LDL goal <70 LDL 66 on 07/2022 Repeat fasting lipid panel today - Continue atorvastatin  20 mg daily      Dispo:  Return in about 3 months (around 06/25/2024).  Signed, Ava Boatman, NP

## 2024-03-25 NOTE — Patient Instructions (Signed)
 Medication Instructions:  START TAKING FARXIGA 10 MG DAILY.   Lab Work: Nutritional therapist IN 2 WEEKS AFTER STARTING FARXIGA.   Testing/Procedures: Your physician has requested that you have an echocardiogram. Echocardiography is a painless test that uses sound waves to create images of your heart. It provides your doctor with information about the size and shape of your heart and how well your heart's chambers and valves are working. This procedure takes approximately one hour. There are no restrictions for this procedure. Please do NOT wear cologne, perfume, aftershave, or lotions (deodorant is allowed). Please arrive 15 minutes prior to your appointment time.  Please note: We ask at that you not bring children with you during ultrasound (echo/ vascular) testing. Due to room size and safety concerns, children are not allowed in the ultrasound rooms during exams. Our front office staff cannot provide observation of children in our lobby area while testing is being conducted. An adult accompanying a patient to their appointment will only be allowed in the ultrasound room at the discretion of the ultrasound technician under special circumstances. We apologize for any inconvenience.   Follow-Up: At [facility], you and your health needs are our priority.  As part of our continuing mission to provide you with exceptional heart care, our providers are all part of one team.  This team includes your primary Cardiologist (physician) and Advanced Practice Providers or APPs (Physician Assistants and Nurse Practitioners) who all work together to provide you with the care you need, when you need it.  Your next appointment:   3 MONTHS POST ECHOCARDIOGRAM.  Provider:   Peter Swaziland, MD

## 2024-04-11 ENCOUNTER — Encounter: Payer: Self-pay | Admitting: Internal Medicine

## 2024-04-11 ENCOUNTER — Ambulatory Visit (INDEPENDENT_AMBULATORY_CARE_PROVIDER_SITE_OTHER): Admitting: Internal Medicine

## 2024-04-11 VITALS — BP 110/70 | HR 92 | Temp 98.2°F | Ht 71.0 in | Wt 191.0 lb

## 2024-04-11 DIAGNOSIS — I509 Heart failure, unspecified: Secondary | ICD-10-CM

## 2024-04-11 DIAGNOSIS — I10 Essential (primary) hypertension: Secondary | ICD-10-CM

## 2024-04-11 DIAGNOSIS — I2510 Atherosclerotic heart disease of native coronary artery without angina pectoris: Secondary | ICD-10-CM

## 2024-04-11 DIAGNOSIS — M24549 Contracture, unspecified hand: Secondary | ICD-10-CM

## 2024-04-11 DIAGNOSIS — M353 Polymyalgia rheumatica: Secondary | ICD-10-CM | POA: Diagnosis not present

## 2024-04-11 NOTE — Assessment & Plan Note (Signed)
 Started on Eliquis for Afib w/RVR Cardiology ref ECHO ordered

## 2024-04-11 NOTE — Assessment & Plan Note (Signed)
 Continue metoprolol  XL, Losartan  and Farxiga 

## 2024-04-11 NOTE — Assessment & Plan Note (Signed)
 Doing well - off steroids 04/2024 -  walking in the street now

## 2024-04-11 NOTE — Progress Notes (Signed)
 Subjective:  Patient ID: Dustin Clayton, male    DOB: [birth year]  Age: 78 y.o. MRN: [id number]  CC: Medical Management of Chronic Issues (3 mnth f/u )   HPI Dustin Clayton presents for PMR, CAD, Dyslipidemia, A fib  Outpatient Medications Prior to Visit  Medication Sig Dispense Refill   apixaban  (ELIQUIS ) 5 MG TABS tablet Take 1 tablet (5 mg total) by mouth 2 (two) times daily. 60 tablet 11   atorvastatin  (LIPITOR ) 20 MG tablet Take 1 tablet (20 mg total) by mouth daily. 90 tablet 2   brimonidine (ALPHAGAN P) 0.1 % SOLN Place into the left eye daily.     dapagliflozin  propanediol (FARXIGA ) 10 MG TABS tablet Take 1 tablet (10 mg total) by mouth daily. 90 tablet 1   dorzolamide-timolol (COSOPT) 22.3-6.8 MG/ML ophthalmic solution Place 1 drop into the left eye 2 (two) times daily.     losartan  (COZAAR ) 25 MG tablet Take 0.5 tablets (12.5 mg total) by mouth daily. 45 tablet 2   metoprolol  succinate (TOPROL -XL) 25 MG 24 hr tablet Take 0.5 tablets (12.5 mg total) by mouth daily. 45 tablet 2   prednisoLONE acetate (PRED FORTE) 1 % ophthalmic suspension Place 1 drop into the left eye daily at 6 (six) AM.     cyclobenzaprine  (FLEXERIL ) 5 MG tablet Take 1 tablet (5 mg total) by mouth 3 (three) times daily as needed for muscle spasms. (Patient not taking: Reported on 03/25/2024) 30 tablet 0   furosemide  (LASIX ) 20 MG tablet Take 1-2 tablets (20-40 mg total) by mouth daily. 60 tablet 3   No facility-administered medications prior to visit.    ROS: Review of Systems  Constitutional:  Negative for appetite change, fatigue and unexpected weight change.  HENT:  Negative for congestion, nosebleeds, sneezing, sore throat and trouble swallowing.   Eyes:  Negative for itching and visual disturbance.  Respiratory:  Negative for cough and shortness of breath.   Cardiovascular:  Negative for chest pain, palpitations and leg swelling.  Gastrointestinal:  Negative for abdominal distention, blood in stool,  diarrhea and nausea.  Genitourinary:  Negative for frequency and hematuria.  Musculoskeletal:  Positive for arthralgias and back pain. Negative for gait problem, joint swelling and neck pain.  Skin:  Negative for rash.  Neurological:  Negative for dizziness, tremors, speech difficulty and weakness.  Psychiatric/Behavioral:  Negative for agitation, dysphoric mood, sleep disturbance and suicidal ideas. The patient is not nervous/anxious.     Objective:  BP 110/70   Pulse 92   Temp 98.2 F (36.8 C) (Oral)   Ht 5\' 11"  (1.803 m)   Wt 191 lb (86.6 kg)   SpO2 98%   BMI 26.64 kg/m   BP Readings from Last 3 Encounters:  04/11/24 110/70  03/25/24 100/70  02/24/24 110/62    Wt Readings from Last 3 Encounters:  04/11/24 191 lb (86.6 kg)  03/25/24 197 lb 12.8 oz (89.7 kg)  02/24/24 195 lb (88.5 kg)    Physical Exam Constitutional:      General: He is not in acute distress.    Appearance: Normal appearance. He is well-developed. He is obese.     Comments: NAD  Eyes:     Conjunctiva/sclera: Conjunctivae normal.     Pupils: Pupils are equal, round, and reactive to light.  Neck:     Thyroid : No thyromegaly.     Vascular: No JVD.  Cardiovascular:     Rate and Rhythm: Normal rate and regular rhythm.     Heart sounds:  Normal heart sounds. No murmur heard.    No friction rub. No gallop.  Pulmonary:     Effort: Pulmonary effort is normal. No respiratory distress.     Breath sounds: Normal breath sounds. No wheezing or rales.  Chest:     Chest wall: No tenderness.  Abdominal:     General: Bowel sounds are normal. There is no distension.     Palpations: Abdomen is soft. There is no mass.     Tenderness: There is no abdominal tenderness. There is no guarding or rebound.  Musculoskeletal:        General: No tenderness. Normal range of motion.     Cervical back: Normal range of motion.     Right lower leg: No edema.     Left lower leg: No edema.  Lymphadenopathy:     Cervical: No  cervical adenopathy.  Skin:    General: Skin is warm and dry.     Findings: No rash.  Neurological:     Mental Status: He is alert and oriented to person, place, and time.     Cranial Nerves: No cranial nerve deficit.     Motor: No abnormal muscle tone.     Coordination: Coordination normal.     Gait: Gait abnormal.     Deep Tendon Reflexes: Reflexes are normal and symmetric.  Psychiatric:        Behavior: Behavior normal.        Thought Content: Thought content normal.        Judgment: Judgment normal.   Antalgic gait L hand w/contracture  Lab Results  Component Value Date   WBC 6.2 02/01/2024   HGB 13.3 02/09/2024   HCT 39.0 02/09/2024   PLT 142 (L) 02/01/2024   GLUCOSE 92 02/24/2024   CHOL 180 07/30/2022   TRIG 141.0 07/30/2022   HDL 85.80 07/30/2022   LDLCALC 66 07/30/2022   ALT 13 11/18/2023   AST 16 11/18/2023   NA 141 02/24/2024   K 4.9 02/24/2024   CL 104 02/24/2024   CREATININE 0.73 (L) 02/24/2024   BUN 9 02/24/2024   CO2 22 02/24/2024   TSH 4.20 11/18/2023   PSA 1.22 04/21/2023   INR 1.00 07/20/2017   HGBA1C 5.8 11/18/2023    CARDIAC CATHETERIZATION Result Date: 02/09/2024   1st Diag lesion is 60% stenosed.   Mid Cx lesion is 100% stenosed.   LV end diastolic pressure is mildly elevated. Single vessel occlusive CAD. 100% distal LCx. No angiographic change since 2018 Mildly elevated LV filling pressure. LVEDP 18 mm Hg. PCWP 20/19, mean 18 mm Hg. V waves are not prominent Normal right heart pressures. PAP 31/12, mean 19 mm Hg. Cardiac output 4.64 L/min, index 2.24 Plan: optimize medical therapy.    Assessment & Plan:   Problem List Items Addressed This Visit     Congestive heart failure (CHF) (HCC)   Continue metoprolol  XL, Losartan  and Farxiga       PMR (polymyalgia rheumatica) (HCC) - Primary   Doing well - off steroids 04/2024 -  walking in the street now      Relevant Orders   CBC with Differential/Platelet   Comprehensive metabolic panel with  GFR   Sedimentation rate   Contracture of hand   L hand w/contracture, chronic Start PT - Giles requested Vit D and K2; BVit B complex w/Vit C      Relevant Orders   Ambulatory referral to Physical Therapy   Essential hypertension   Continue metoprolol  XL, Losartan   and Farxiga       Relevant Orders   Comprehensive metabolic panel with GFR   Sedimentation rate   CAD (coronary artery disease), native coronary artery   Started on Eliquis  for Afib w/RVR Cardiology ref ECHO ordered         No orders of the defined types were placed in this encounter.     Follow-up: Return in about 3 months (around 07/12/2024) for a follow-up visit.  Anitra Barn, MD

## 2024-04-11 NOTE — Assessment & Plan Note (Addendum)
 L hand w/contracture, chronic Start PT - Giles requested Vit D and K2; BVit B complex w/Vit C

## 2024-05-02 ENCOUNTER — Emergency Department (HOSPITAL_COMMUNITY)

## 2024-05-02 ENCOUNTER — Emergency Department (HOSPITAL_COMMUNITY)
Admission: EM | Admit: 2024-05-02 | Discharge: 2024-05-02 | Disposition: A | Discharge status: Home / Self Care | Attending: Emergency Medicine | Admitting: Emergency Medicine

## 2024-05-02 ENCOUNTER — Encounter (HOSPITAL_COMMUNITY): Payer: Self-pay

## 2024-05-02 ENCOUNTER — Other Ambulatory Visit: Payer: Self-pay

## 2024-05-02 DIAGNOSIS — S51812A Laceration without foreign body of left forearm, initial encounter: Secondary | ICD-10-CM | POA: Diagnosis not present

## 2024-05-02 DIAGNOSIS — Z7901 Long term (current) use of anticoagulants: Secondary | ICD-10-CM | POA: Diagnosis not present

## 2024-05-02 DIAGNOSIS — W01198A Fall on same level from slipping, tripping and stumbling with subsequent striking against other object, initial encounter: Secondary | ICD-10-CM | POA: Insufficient documentation

## 2024-05-02 DIAGNOSIS — S20419A Abrasion of unspecified back wall of thorax, initial encounter: Secondary | ICD-10-CM | POA: Diagnosis not present

## 2024-05-02 DIAGNOSIS — I7143 Infrarenal abdominal aortic aneurysm, without rupture: Secondary | ICD-10-CM | POA: Diagnosis not present

## 2024-05-02 DIAGNOSIS — Z79899 Other long term (current) drug therapy: Secondary | ICD-10-CM | POA: Diagnosis not present

## 2024-05-02 DIAGNOSIS — S80212A Abrasion, left knee, initial encounter: Secondary | ICD-10-CM | POA: Diagnosis not present

## 2024-05-02 DIAGNOSIS — S0990XA Unspecified injury of head, initial encounter: Secondary | ICD-10-CM | POA: Diagnosis present

## 2024-05-02 DIAGNOSIS — S0101XA Laceration without foreign body of scalp, initial encounter: Secondary | ICD-10-CM | POA: Insufficient documentation

## 2024-05-02 DIAGNOSIS — W19XXXA Unspecified fall, initial encounter: Secondary | ICD-10-CM

## 2024-05-02 DIAGNOSIS — I2510 Atherosclerotic heart disease of native coronary artery without angina pectoris: Secondary | ICD-10-CM | POA: Insufficient documentation

## 2024-05-02 DIAGNOSIS — I10 Essential (primary) hypertension: Secondary | ICD-10-CM | POA: Insufficient documentation

## 2024-05-02 LAB — URINALYSIS, ROUTINE W REFLEX MICROSCOPIC
Bilirubin Urine: NEGATIVE
Glucose, UA: >=500 mg/dL — AB
Hgb urine dipstick: NEGATIVE
Ketones, ur: 5 mg/dL — AB
Leukocytes,Ua: NEGATIVE
Nitrite: NEGATIVE
Protein, ur: NEGATIVE mg/dL
Specific Gravity, Urine: >1.046 — ABNORMAL HIGH (ref 1.005–1.030)
pH: 5 (ref 5.0–8.0)

## 2024-05-02 LAB — I-STAT CHEM 8, ED
BUN: 10 mg/dL (ref 8–23)
Calcium, Ion: 1.11 mmol/L — ABNORMAL LOW (ref 1.15–1.40)
Chloride: 104 mmol/L (ref 98–111)
Creatinine, Ser: 0.8 mg/dL (ref 0.61–1.24)
Glucose, Bld: 100 mg/dL — ABNORMAL HIGH (ref 70–99)
HCT: 43 % (ref 39.0–52.0)
Hemoglobin: 14.6 g/dL (ref 13.0–17.0)
Potassium: 4 mmol/L (ref 3.5–5.1)
Sodium: 139 mmol/L (ref 135–145)
TCO2: 21 mmol/L — ABNORMAL LOW (ref 22–32)

## 2024-05-02 LAB — CBC
HCT: 43 % (ref 39.0–52.0)
Hemoglobin: 14 g/dL (ref 13.0–17.0)
MCH: 30.7 pg (ref 26.0–34.0)
MCHC: 32.6 g/dL (ref 30.0–36.0)
MCV: 94.3 fL (ref 80.0–100.0)
Platelets: 163 10*3/uL (ref 150–400)
RBC: 4.56 MIL/uL (ref 4.22–5.81)
RDW: 14.3 % (ref 11.5–15.5)
WBC: 6.3 10*3/uL (ref 4.0–10.5)
nRBC: 0 % (ref 0.0–0.2)

## 2024-05-02 LAB — COMPREHENSIVE METABOLIC PANEL WITH GFR
ALT: 12 U/L (ref 0–44)
AST: 18 U/L (ref 15–41)
Albumin: 3.7 g/dL (ref 3.5–5.0)
Alkaline Phosphatase: 52 U/L (ref 38–126)
Anion gap: 11 (ref 5–15)
BUN: 9 mg/dL (ref 8–23)
CO2: 21 mmol/L — ABNORMAL LOW (ref 22–32)
Calcium: 8.7 mg/dL — ABNORMAL LOW (ref 8.9–10.3)
Chloride: 106 mmol/L (ref 98–111)
Creatinine, Ser: 0.85 mg/dL (ref 0.61–1.24)
GFR, Estimated: >60 mL/min (ref >60)
Glucose, Bld: 102 mg/dL — ABNORMAL HIGH (ref 70–99)
Potassium: 4.1 mmol/L (ref 3.5–5.1)
Sodium: 138 mmol/L (ref 135–145)
Total Bilirubin: 1.4 mg/dL — ABNORMAL HIGH (ref 0.0–1.2)
Total Protein: 6.3 g/dL — ABNORMAL LOW (ref 6.5–8.1)

## 2024-05-02 LAB — PROTIME-INR
INR: 1.1 (ref 0.8–1.2)
Prothrombin Time: 15.3 s — ABNORMAL HIGH (ref 11.4–15.2)

## 2024-05-02 LAB — ETHANOL: Alcohol, Ethyl (B): <15 mg/dL (ref <15)

## 2024-05-02 LAB — SAMPLE TO BLOOD BANK

## 2024-05-02 LAB — I-STAT CG4 LACTIC ACID, ED: Lactic Acid, Venous: 1.5 mmol/L (ref 0.5–1.9)

## 2024-05-02 MED ORDER — IOHEXOL 350 MG/ML SOLN
75.0000 mL | Freq: Once | INTRAVENOUS | Status: AC | PRN
  Administered 2024-05-02: 75 mL via INTRAVENOUS

## 2024-05-02 MED ORDER — FENTANYL CITRATE PF 50 MCG/ML IJ SOSY: 50.0000 ug | PREFILLED_SYRINGE | Freq: Once | INTRAMUSCULAR | Status: DC

## 2024-05-02 NOTE — ED Triage Notes (Addendum)
 Pt from home, mechanical fall. No LOC. On eliquis  for afib. Pupil left-3, right-2. Head laceration to back of head. Axox4. VSS. Skin tears to left arm. Pt has abrasions to upper shoulder blades.

## 2024-05-02 NOTE — Discharge Instructions (Signed)
 You were seen in the emerged part after a fall The laceration in your head was repaired with 2 staples These will need to come out in 8 to 10 days This can be done at your primary care doctor's office, in urgent care or back in the emergency department The CAT scans did not show any evidence of acute traumatic injury You have an infrarenal abdominal aortic aneurysm measuring 3.5 cm that require follow-up imaging Return to the emerged part for severe pain repeated falls or any other concerns Otherwise continue taking previous prescribed occasions and follow-up with your primary care doctor within 1 week for reevaluation

## 2024-05-02 NOTE — ED Notes (Signed)
 3cm scalp laceration to posterior occipital, MD penna applied 2 staples

## 2024-05-02 NOTE — ED Provider Notes (Signed)
 [place] EMERGENCY DEPARTMENT AT [facility] Provider Note   CSN: [id number] Arrival date & time: 05/02/24  1600     Patient presents with: Dustin Clayton is a 78 y.o. male.  With a history of CAD, A-fib on Eliquis , hypertension who presents to the ED after a fall.  Patient suffered mechanical fall while taking the trash out today.  Clemens backwards struck his head.  No LOC.  Skin tears to left arm.  Last dose of Eliquis  was this morning.  Pupils asymmetric at baseline secondary to history of retinal detachment.  Denies headaches chest pain shortness of breath abdominal pain pain in extremities at this time.  Activated as level 2 trauma due to advanced age fall on thinners    Fall       Prior to Admission medications   Medication Sig Start Date End Date Taking? Authorizing Provider  apixaban  (ELIQUIS ) 5 MG TABS tablet Take 1 tablet (5 mg total) by mouth 2 (two) times daily. 12/25/23   Plotnikov, Aleksei V, MD  atorvastatin  (LIPITOR ) 20 MG tablet Take 1 tablet (20 mg total) by mouth daily. 01/06/24   [place], Peter M, MD  brimonidine (ALPHAGAN P) 0.1 % SOLN Place into the left eye daily. 02/10/24   [provider]  cyclobenzaprine  (FLEXERIL ) 5 MG tablet Take 1 tablet (5 mg total) by mouth 3 (three) times daily as needed for muscle spasms. Patient not taking: Reported on 03/25/2024 04/21/23   Plotnikov, Aleksei V, MD  dapagliflozin  propanediol (FARXIGA ) 10 MG TABS tablet Take 1 tablet (10 mg total) by mouth daily. 03/25/24   Rana Lum CROME, NP  dorzolamide-timolol (COSOPT) 22.3-6.8 MG/ML ophthalmic solution Place 1 drop into the left eye 2 (two) times daily. 03/05/22   [provider]  losartan  (COZAAR ) 25 MG tablet Take 0.5 tablets (12.5 mg total) by mouth daily. 02/24/24   Rana Lum CROME, NP  metoprolol  succinate (TOPROL -XL) 25 MG 24 hr tablet Take 0.5 tablets (12.5 mg total) by mouth daily. 02/24/24   Rana Lum CROME, NP  prednisoLONE acetate (PRED  FORTE) 1 % ophthalmic suspension Place 1 drop into the left eye daily at 6 (six) AM. 01/15/24   [provider]    Allergies: Other    Review of Systems  Updated Vital Signs BP 138/79   Pulse 91   Temp 98.2 F (36.8 C) (Oral)   Resp 18   Ht 5' 11 (1.803 m)   Wt 84.8 kg   SpO2 100%   BMI 26.08 kg/m   Physical Exam Vitals and nursing note reviewed.  HENT:     Head:     Comments: 3 cm laceration to left posterior occiput minimal active bleeding   Eyes:     Extraocular Movements: Extraocular movements intact.     Comments: Pupils unequal, baseline Right pupil 2 mm round reactive to light Left pupil 3 mm round reactive light   Cardiovascular:     Rate and Rhythm: Normal rate and regular rhythm.  Pulmonary:     Effort: Pulmonary effort is normal.     Breath sounds: Normal breath sounds.  Abdominal:     Palpations: Abdomen is soft.     Tenderness: There is no abdominal tenderness.   Musculoskeletal:     Cervical back: Neck supple. No tenderness.     Comments: Abrasion to mid thoracic region and upper thoracic region No midline tenderness step-off deformity of back 5 and 5 motion bilateral upper and lower extremities Sensation  tact light touch throughout Distal pulses intact Skin tear to left forearm Small abrasion over left knee   Skin:    General: Skin is warm and dry.   Neurological:     Mental Status: He is alert and oriented to person, place, and time.     Sensory: No sensory deficit.     Motor: No weakness.   Psychiatric:        Mood and Affect: Mood normal.     (all labs ordered are listed, but only abnormal results are displayed) Labs Reviewed  COMPREHENSIVE METABOLIC PANEL WITH GFR - Abnormal; Notable for the following components:      Result Value   CO2 21 (*)    Glucose, Bld 102 (*)    Calcium  8.7 (*)    Total Protein 6.3 (*)    Total Bilirubin 1.4 (*)    All other components within normal limits  URINALYSIS, ROUTINE W REFLEX  MICROSCOPIC - Abnormal; Notable for the following components:   Specific Gravity, Urine >1.046 (*)    Glucose, UA >=500 (*)    Ketones, ur 5 (*)    Bacteria, UA RARE (*)    All other components within normal limits  PROTIME-INR - Abnormal; Notable for the following components:   Prothrombin Time 15.3 (*)    All other components within normal limits  I-STAT CHEM 8, ED - Abnormal; Notable for the following components:   Glucose, Bld 100 (*)    Calcium , Ion 1.11 (*)    TCO2 21 (*)    All other components within normal limits  CBC  ETHANOL  I-STAT CG4 LACTIC ACID, ED  SAMPLE TO BLOOD BANK    EKG: None  Radiology: CT HEAD WO CONTRAST Result Date: 05/02/2024 CLINICAL DATA:  Trauma, fall EXAM: CT HEAD WITHOUT CONTRAST CT CERVICAL SPINE WITHOUT CONTRAST TECHNIQUE: Multidetector CT imaging of the head and cervical spine was performed following the standard protocol without intravenous contrast. Multiplanar CT image reconstructions of the cervical spine were also generated. RADIATION DOSE REDUCTION: This exam was performed according to the departmental dose-optimization program which includes automated exposure control, adjustment of the mA and/or kV according to patient size and/or use of iterative reconstruction technique. COMPARISON:  None Available. FINDINGS: CT HEAD FINDINGS Brain: No evidence of acute infarction, hemorrhage, hydrocephalus, extra-axial collection or mass lesion/mass effect. Vascular: No hyperdense vessel or unexpected calcification. Skull: Normal. Negative for fracture or focal lesion. Sinuses/Orbits: Hyperdensity of the left vitreous (series 3, image 8). Other: Scalp laceration, hematoma, and stapling of the left scalp vertex (series 3, image 33). CT CERVICAL SPINE FINDINGS Alignment: Degenerative straightening of the normal cervical lordosis. Skull base and vertebrae: No acute fracture. No primary bone lesion or focal pathologic process. Soft tissues and spinal canal: No  prevertebral fluid or swelling. No visible canal hematoma. Disc levels: Moderate multilevel cervical disc degenerative disease, worst from C4-C6. Upper chest: Negative. Other: None. IMPRESSION: 1. No acute intracranial pathology. 2. Scalp laceration, hematoma, and stapling of the left scalp vertex. 3. Hyperdensity of the left vitreous humerus, presumably reflecting vitreous silicone injection. 4. No fracture or static subluxation of the cervical spine. 5. 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 Electronically Signed   By: Marolyn JONETTA Jaksch M.D.   On: 05/02/2024 17:25   CT CERVICAL SPINE WO CONTRAST Result Date: 05/02/2024 CLINICAL DATA:  Trauma, fall EXAM: CT HEAD WITHOUT CONTRAST CT CERVICAL SPINE WITHOUT CONTRAST TECHNIQUE: Multidetector CT imaging of the head and cervical spine was performed following  the standard protocol without intravenous contrast. Multiplanar CT image reconstructions of the cervical spine were also generated. RADIATION DOSE REDUCTION: This exam was performed according to the departmental dose-optimization program which includes automated exposure control, adjustment of the mA and/or kV according to patient size and/or use of iterative reconstruction technique. COMPARISON:  None Available. FINDINGS: CT HEAD FINDINGS Brain: No evidence of acute infarction, hemorrhage, hydrocephalus, extra-axial collection or mass lesion/mass effect. Vascular: No hyperdense vessel or unexpected calcification. Skull: Normal. Negative for fracture or focal lesion. Sinuses/Orbits: Hyperdensity of the left vitreous (series 3, image 8). Other: Scalp laceration, hematoma, and stapling of the left scalp vertex (series 3, image 33). CT CERVICAL SPINE FINDINGS Alignment: Degenerative straightening of the normal cervical lordosis. Skull base and vertebrae: No acute fracture. No primary bone lesion or focal pathologic process. Soft tissues and spinal canal: No prevertebral fluid or  swelling. No visible canal hematoma. Disc levels: Moderate multilevel cervical disc degenerative disease, worst from C4-C6. Upper chest: Negative. Other: None. IMPRESSION: 1. No acute intracranial pathology. 2. Scalp laceration, hematoma, and stapling of the left scalp vertex. 3. Hyperdensity of the left vitreous humerus, presumably reflecting vitreous silicone injection. 4. No fracture or static subluxation of the cervical spine. 5. 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 Electronically Signed   By: Marolyn JONETTA Jaksch M.D.   On: 05/02/2024 17:25   CT CHEST ABDOMEN PELVIS W CONTRAST Result Date: 05/02/2024 CLINICAL DATA:  Trauma, fall EXAM: CT CHEST, ABDOMEN, AND PELVIS WITH CONTRAST CT THORACIC AND LUMBAR SPINE WITH CONTRAST TECHNIQUE: Multidetector CT imaging of the chest, abdomen and pelvis was performed following the standard protocol during bolus administration of intravenous contrast. Multidetector CT imaging of the thoracic and lumbar spine was performed following the standard protocol during bolus administration of intravenous contrast. RADIATION DOSE REDUCTION: This exam was performed according to the departmental dose-optimization program which includes automated exposure control, adjustment of the mA and/or kV according to patient size and/or use of iterative reconstruction technique. CONTRAST:  75mL OMNIPAQUE  IOHEXOL  350 MG/ML SOLN COMPARISON:  None Available. FINDINGS: CT CHEST FINDINGS Cardiovascular: Aortic atherosclerosis. Cardiomegaly. Three-vessel coronary artery calcifications. No pericardial effusion. Scattered venous air insufflation in the right shoulder and lower right neck. Mediastinum/Nodes: No enlarged mediastinal, hilar, or axillary lymph nodes. Thyroid  gland, trachea, and esophagus demonstrate no significant findings. Lungs/Pleura: Moderate right, small left pleural effusions and associated atelectasis or consolidation. Mild diffuse interlobular septal  thickening throughout. Mild diffuse bronchial wall thickening. Musculoskeletal: No chest wall mass or suspicious osseous lesions identified. CT ABDOMEN PELVIS FINDINGS Hepatobiliary: No focal liver abnormality is seen. Status post cholecystectomy. No biliary dilatation. Pancreas: Unremarkable. No pancreatic ductal dilatation or surrounding inflammatory changes. Spleen: Normal in size without significant abnormality. Adrenals/Urinary Tract: Adrenal glands are unremarkable. Kidneys are normal, without renal calculi, solid lesion, or hydronephrosis. Bladder is unremarkable. Stomach/Bowel: Stomach is within normal limits. Appendix not clearly visualized. No evidence of bowel wall thickening, distention, or inflammatory changes. Severe pancolonic diverticulosis. Vascular/Lymphatic: Aortic atherosclerosis. 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 No enlarged abdominal or pelvic lymph nodes. Reproductive: No mass or other abnormality. Other: No abdominal wall hernia or abnormality. No ascites. Musculoskeletal: No acute osseous findings. CT THORACIC AND LUMBAR SPINE FINDINGS Alignment: Normal thoracic kyphosis. Normal lumbar lordosis. Vertebral bodies: Intact. No fracture or dislocation. 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 Generally mild disc space height loss throughout the thoracic and lumbar spine, focally  severe at L5-S1. Paraspinous soft tissues: Unremarkable. IMPRESSION: 1. No CT evidence of acute traumatic injury to the chest, abdomen, or pelvis. 2. Moderate right, small left pleural effusions and associated atelectasis or consolidation as well as interstitial edema. 3. Cardiomegaly and coronary artery disease. 4. 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 Recommend follow-up ultrasound every 2 years if not otherwise imaged and if clinically appropriate. 5. Severe pancolonic  diverticulosis without evidence of acute diverticulitis. 6. No fracture or dislocation of the thoracic or lumbar spine. 7. 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 Generally mild disc space height loss throughout the thoracic and lumbar spine, focally severe at L5-S1. Aortic Atherosclerosis (ICD10-I70.0). Electronically Signed   By: Marolyn JONETTA Jaksch M.D.   On: 05/02/2024 17:22   CT L-SPINE NO CHARGE Result Date: 05/02/2024 CLINICAL DATA:  Trauma, fall EXAM: CT CHEST, ABDOMEN, AND PELVIS WITH CONTRAST CT THORACIC AND LUMBAR SPINE WITH CONTRAST TECHNIQUE: Multidetector CT imaging of the chest, abdomen and pelvis was performed following the standard protocol during bolus administration of intravenous contrast. Multidetector CT imaging of the thoracic and lumbar spine was performed following the standard protocol during bolus administration of intravenous contrast. RADIATION DOSE REDUCTION: This exam was performed according to the departmental dose-optimization program which includes automated exposure control, adjustment of the mA and/or kV according to patient size and/or use of iterative reconstruction technique. CONTRAST:  75mL OMNIPAQUE  IOHEXOL  350 MG/ML SOLN COMPARISON:  None Available. FINDINGS: CT CHEST FINDINGS Cardiovascular: Aortic atherosclerosis. Cardiomegaly. Three-vessel coronary artery calcifications. No pericardial effusion. Scattered venous air insufflation in the right shoulder and lower right neck. Mediastinum/Nodes: No enlarged mediastinal, hilar, or axillary lymph nodes. Thyroid  gland, trachea, and esophagus demonstrate no significant findings. Lungs/Pleura: Moderate right, small left pleural effusions and associated atelectasis or consolidation. Mild diffuse interlobular septal thickening throughout. Mild diffuse bronchial wall thickening. Musculoskeletal: No chest wall mass or suspicious osseous lesions identified. CT ABDOMEN PELVIS  FINDINGS Hepatobiliary: No focal liver abnormality is seen. Status post cholecystectomy. No biliary dilatation. Pancreas: Unremarkable. No pancreatic ductal dilatation or surrounding inflammatory changes. Spleen: Normal in size without significant abnormality. Adrenals/Urinary Tract: Adrenal glands are unremarkable. Kidneys are normal, without renal calculi, solid lesion, or hydronephrosis. Bladder is unremarkable. Stomach/Bowel: Stomach is within normal limits. Appendix not clearly visualized. No evidence of bowel wall thickening, distention, or inflammatory changes. Severe pancolonic diverticulosis. Vascular/Lymphatic: Aortic atherosclerosis. 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 No enlarged abdominal or pelvic lymph nodes. Reproductive: No mass or other abnormality. Other: No abdominal wall hernia or abnormality. No ascites. Musculoskeletal: No acute osseous findings. CT THORACIC AND LUMBAR SPINE FINDINGS Alignment: Normal thoracic kyphosis. Normal lumbar lordosis. Vertebral bodies: Intact. No fracture or dislocation. 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 Generally mild disc space height loss throughout the thoracic and lumbar spine, focally severe at L5-S1. Paraspinous soft tissues: Unremarkable. IMPRESSION: 1. No CT evidence of acute traumatic injury to the chest, abdomen, or pelvis. 2. Moderate right, small left pleural effusions and associated atelectasis or consolidation as well as interstitial edema. 3. Cardiomegaly and coronary artery disease. 4. 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 Recommend follow-up ultrasound every 2 years if not otherwise imaged and if clinically appropriate. 5. Severe pancolonic diverticulosis without evidence of acute diverticulitis. 6. No fracture or dislocation of the thoracic or lumbar spine. 7. Extensive  bridging osteophytosis and  ankylosis throughout the thoracic spine, in keeping with DISH. Generally mild disc space height loss throughout the thoracic and lumbar spine, focally severe at L5-S1. Aortic Atherosclerosis (ICD10-I70.0). Electronically Signed   By: Marolyn JONETTA Jaksch M.D.   On: 05/02/2024 17:22   CT T-SPINE NO CHARGE Result Date: 05/02/2024 CLINICAL DATA:  Trauma, fall EXAM: CT CHEST, ABDOMEN, AND PELVIS WITH CONTRAST CT THORACIC AND LUMBAR SPINE WITH CONTRAST TECHNIQUE: Multidetector CT imaging of the chest, abdomen and pelvis was performed following the standard protocol during bolus administration of intravenous contrast. Multidetector CT imaging of the thoracic and lumbar spine was performed following the standard protocol during bolus administration of intravenous contrast. RADIATION DOSE REDUCTION: This exam was performed according to the departmental dose-optimization program which includes automated exposure control, adjustment of the mA and/or kV according to patient size and/or use of iterative reconstruction technique. CONTRAST:  75mL OMNIPAQUE  IOHEXOL  350 MG/ML SOLN COMPARISON:  None Available. FINDINGS: CT CHEST FINDINGS Cardiovascular: Aortic atherosclerosis. Cardiomegaly. Three-vessel coronary artery calcifications. No pericardial effusion. Scattered venous air insufflation in the right shoulder and lower right neck. Mediastinum/Nodes: No enlarged mediastinal, hilar, or axillary lymph nodes. Thyroid  gland, trachea, and esophagus demonstrate no significant findings. Lungs/Pleura: Moderate right, small left pleural effusions and associated atelectasis or consolidation. Mild diffuse interlobular septal thickening throughout. Mild diffuse bronchial wall thickening. Musculoskeletal: No chest wall mass or suspicious osseous lesions identified. CT ABDOMEN PELVIS FINDINGS Hepatobiliary: No focal liver abnormality is seen. Status post cholecystectomy. No biliary dilatation. Pancreas: Unremarkable. No pancreatic ductal  dilatation or surrounding inflammatory changes. Spleen: Normal in size without significant abnormality. Adrenals/Urinary Tract: Adrenal glands are unremarkable. Kidneys are normal, without renal calculi, solid lesion, or hydronephrosis. Bladder is unremarkable. Stomach/Bowel: Stomach is within normal limits. Appendix not clearly visualized. No evidence of bowel wall thickening, distention, or inflammatory changes. Severe pancolonic diverticulosis. Vascular/Lymphatic: Aortic atherosclerosis. 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 No enlarged abdominal or pelvic lymph nodes. Reproductive: No mass or other abnormality. Other: No abdominal wall hernia or abnormality. No ascites. Musculoskeletal: No acute osseous findings. CT THORACIC AND LUMBAR SPINE FINDINGS Alignment: Normal thoracic kyphosis. Normal lumbar lordosis. Vertebral bodies: Intact. No fracture or dislocation. 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 Generally mild disc space height loss throughout the thoracic and lumbar spine, focally severe at L5-S1. Paraspinous soft tissues: Unremarkable. IMPRESSION: 1. No CT evidence of acute traumatic injury to the chest, abdomen, or pelvis. 2. Moderate right, small left pleural effusions and associated atelectasis or consolidation as well as interstitial edema. 3. Cardiomegaly and coronary artery disease. 4. 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 Recommend follow-up ultrasound every 2 years if not otherwise imaged and if clinically appropriate. 5. Severe pancolonic diverticulosis without evidence of acute diverticulitis. 6. No fracture or dislocation of the thoracic or lumbar spine. 7. 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 Generally mild disc space height loss throughout the thoracic and lumbar spine, focally severe at  L5-S1. Aortic Atherosclerosis (ICD10-I70.0). Electronically Signed   By: Marolyn JONETTA Jaksch M.D.   On: 05/02/2024 17:22   DG Pelvis Portable Result Date: 05/02/2024 CLINICAL DATA:  Trauma, fall. EXAM: PORTABLE PELVIS 1-2 VIEWS COMPARISON:  None Available. FINDINGS: The cortical margins of the bony pelvis are intact. No fracture. Pubic symphysis and sacroiliac joints are congruent. Both femoral heads are well-seated in the respective  acetabula. Mild bilateral hip osteoarthritis. IMPRESSION: No pelvic fracture. Electronically Signed   By: Andrea Gasman M.D.   On: 05/02/2024 16:55   DG Chest Port 1 View Result Date: 05/02/2024 CLINICAL DATA:  Trauma, mechanical fall. EXAM: PORTABLE CHEST 1 VIEW COMPARISON:  12/25/2023 FINDINGS: Cardiomegaly is unchanged. Stable mediastinal contours with aortic atherosclerosis. Chronic blunting of right costophrenic angle. No pneumothorax. No confluent consolidation. On limited assessment, no acute osseous findings. IMPRESSION: 1. No acute traumatic findings. 2. Unchanged cardiomegaly. Unchanged blunting of right costophrenic angle. Electronically Signed   By: Andrea Gasman M.D.   On: 05/02/2024 16:54     Ultrasound ED FAST  Date/Time: 05/02/2024 4:54 PM  Performed by: Pamella Ozell LABOR, DO Authorized by: Pamella Ozell LABOR, DO  Procedure details:    Indications: blunt abdominal trauma and blunt chest trauma       Assess for:  Hemothorax, intra-abdominal fluid, pericardial effusion and pneumothorax    Technique:  Abdominal, chest and cardiac    Images: archived      Abdominal findings:    L kidney:  Visualized   R kidney:  Visualized   Liver:  Visualized    Bladder:  Visualized, Foley catheter not visualized   Hepatorenal space visualized: identified     Splenorenal space: identified     Rectovesical free fluid: not identified     Splenorenal free fluid: not identified     Hepatorenal space free fluid: not identified   Cardiac findings:    Heart:   Visualized   Wall motion: identified     Pericardial effusion: identified   Chest findings:    L lung sliding: identified     R lung sliding: identified     Fluid in thorax: not identified   Comments:     Moderate pericardial effusion, chronic E fast otherwise negative .Laceration Repair  Date/Time: 05/02/2024 9:12 PM  Performed by: Pamella Ozell LABOR, DO Authorized by: Pamella Ozell LABOR, DO   Consent:    Consent obtained:  Verbal   Consent given by:  Patient   Risks discussed:  Infection, need for additional repair, nerve damage, poor wound healing and vascular damage   Alternatives discussed:  No treatment Universal protocol:    Immediately prior to procedure, a time out was called: yes     Patient identity confirmed:  Verbally with patient Anesthesia:    Anesthesia method:  None Laceration details:    Location:  Scalp   Scalp location:  L parietal   Length (cm):  3   Depth (mm):  0.5 Pre-procedure details:    Preparation:  Patient was prepped and draped in usual sterile fashion Exploration:    Contaminated: no   Treatment:    Area cleansed with:  Saline   Amount of cleaning:  Extensive   Irrigation volume:  20   Irrigation method:  Pressure wash Skin repair:    Repair method:  Staples   Number of staples:  2 Approximation:    Approximation:  Close Repair type:    Repair type:  Simple Post-procedure details:    Dressing:  Open (no dressing)   Procedure completion:  Tolerated    Medications Ordered in the ED  iohexol  (OMNIPAQUE ) 350 MG/ML injection 75 mL (75 mLs Intravenous Contrast Given 05/02/24 1703)    Clinical Course as of 05/02/24 2113  Mon May 02, 2024  2111 Laboratory workup unremarkable.  No acute traumatic findings on CT head C-spine chest abdomen pelvis T-spine or L-spine.  Chest x-ray pelvis  x-ray look okay.  I informed patient about infrarenal abdominal aortic aneurysm that require follow-up.  He is stable for discharge at this time [MP]     Clinical Course User Index [MP] Pamella Ozell LABOR, DO                                 Medical Decision Making 78 year old male with history as above presenting as a level 2 trauma given concern for fall on thinners.  Clemens out doors.  Mechanical in nature.  Obvious head trauma with laceration.  Laceration repaired with 2 staples.  Abrasion over back left forearm.  Full active range of motion.  Hemodynamically stable.  Given fall on thinners with obvious head upper back trauma will obtain CT pan scan to evaluate for traumatic injury and labs per trauma panel.  E-FAST negative  Amount and/or Complexity of Data Reviewed Labs: ordered. Radiology: ordered.  Risk Prescription drug management.        Final diagnoses:  Infrarenal abdominal aortic aneurysm (AAA) without rupture [facility] [facility])  Fall, initial encounter  Laceration of scalp, initial encounter    ED Discharge Orders     None          Pamella Ozell LABOR, DO 05/02/24 2113

## 2024-05-03 ENCOUNTER — Other Ambulatory Visit: Payer: Self-pay | Admitting: Internal Medicine

## 2024-05-08 ENCOUNTER — Encounter: Payer: Self-pay | Admitting: Internal Medicine

## 2024-05-12 ENCOUNTER — Encounter: Payer: Self-pay | Admitting: Internal Medicine

## 2024-05-12 ENCOUNTER — Ambulatory Visit: Admitting: Internal Medicine

## 2024-05-12 VITALS — BP 108/70 | HR 89 | Temp 98.0°F | Ht 71.0 in | Wt 196.4 lb

## 2024-05-12 DIAGNOSIS — I509 Heart failure, unspecified: Secondary | ICD-10-CM

## 2024-05-12 DIAGNOSIS — R609 Edema, unspecified: Secondary | ICD-10-CM | POA: Diagnosis not present

## 2024-05-12 DIAGNOSIS — R296 Repeated falls: Secondary | ICD-10-CM

## 2024-05-12 DIAGNOSIS — M353 Polymyalgia rheumatica: Secondary | ICD-10-CM

## 2024-05-12 MED ORDER — PREDNISONE 5 MG PO TABS: 5.0000 mg | ORAL_TABLET | Freq: Every day | ORAL | 1 refills | Status: AC

## 2024-05-12 MED ORDER — MUPIROCIN 2 % EX OINT
TOPICAL_OINTMENT | CUTANEOUS | 0 refills | Status: AC
Start: 2024-05-12 — End: ?

## 2024-05-12 NOTE — Patient Instructions (Signed)
 [place] [facility] [employer]

## 2024-05-12 NOTE — Progress Notes (Signed)
 Subjective:  Patient ID: Dustin Clayton, male    DOB: 08-12-1946  Age: 78 y.o. MRN: [id number]  CC: Medical Management of Chronic Issues [facility] follow up after a fall. Pt has stiches on top of the head and some laceration on the left elbow and left forearms.)   HPI Dustin Clayton presents for PMR - worse. F/u on CHF, anticoagulation. F/u on the fall w/lacerations:  Per hx: Dustin Clayton is a 78 y.o. male.  With a history of CAD, A-fib on Eliquis , hypertension who presents to the ED after a fall.  Patient suffered mechanical fall while taking the trash out today.  Clemens backwards struck his head.  No LOC.  Skin tears to left arm.  Last dose of Eliquis  was this morning.  Pupils asymmetric at baseline secondary to history of retinal detachment.  Denies headaches chest pain shortness of breath abdominal pain pain in extremities at this time.  Activated as level 2 trauma due to advanced age fall on thinners       Fall                Prior to Admission medications   Medication Sig Start Date End Date Taking? Authorizing Provider  apixaban  (ELIQUIS ) 5 MG TABS tablet Take 1 tablet (5 mg total) by mouth 2 (two) times daily. 12/25/23     Runa Whittingham V, MD  atorvastatin  (LIPITOR ) 20 MG tablet Take 1 tablet (20 mg total) by mouth daily. 01/06/24     [place], Peter M, MD  brimonidine (ALPHAGAN P) 0.1 % SOLN Place into the left eye daily. 02/10/24     [provider]  cyclobenzaprine  (FLEXERIL ) 5 MG tablet Take 1 tablet (5 mg total) by mouth 3 (three) times daily as needed for muscle spasms. Patient not taking: Reported on 03/25/2024 04/21/23     Maize Brittingham V, MD  dapagliflozin  propanediol (FARXIGA ) 10 MG TABS tablet Take 1 tablet (10 mg total) by mouth daily. 03/25/24     Rana Lum CROME, NP  dorzolamide-timolol (COSOPT) 22.3-6.8 MG/ML ophthalmic solution Place 1 drop into the left eye 2 (two) times daily. 03/05/22     [provider]  losartan  (COZAAR ) 25 MG tablet  Take 0.5 tablets (12.5 mg total) by mouth daily. 02/24/24     Rana Lum CROME, NP  metoprolol  succinate (TOPROL -XL) 25 MG 24 hr tablet Take 0.5 tablets (12.5 mg total) by mouth daily. 02/24/24     Rana Lum CROME, NP  prednisoLONE acetate (PRED FORTE) 1 % ophthalmic suspension Place 1 drop into the left eye daily at 6 (six) AM. 01/15/24     [provider]      Allergies: Other     Review of Systems   Updated Vital Signs BP 138/79   Pulse 91   Temp 98.2 F (36.8 C) (Oral)   Resp 18   Ht 5' 11 (1.803 m)   Wt 84.8 kg   SpO2 100%   BMI 26.08 kg/m    Physical Exam Vitals and nursing note reviewed.  HENT:     Head:     Comments: 3 cm laceration to left posterior occiput minimal active bleeding     Eyes:     Extraocular Movements: Extraocular movements intact.     Comments: Pupils unequal, baseline Right pupil 2 mm round reactive to light Left pupil 3 mm round reactive light    Cardiovascular:     Rate and Rhythm: Normal rate and regular rhythm.  Pulmonary:  Effort: Pulmonary effort is normal.     Breath sounds: Normal breath sounds.  Abdominal:     Palpations: Abdomen is soft.     Tenderness: There is no abdominal tenderness.    Musculoskeletal:     Cervical back: Neck supple. No tenderness.     Comments: Abrasion to mid thoracic region and upper thoracic region No midline tenderness step-off deformity of back 5 and 5 motion bilateral upper and lower extremities Sensation tact light touch throughout Distal pulses intact Skin tear to left forearm Small abrasion over left knee    Skin:    General: Skin is warm and dry.    Neurological:     Mental Status: He is alert and oriented to person, place, and time.     Sensory: No sensory deficit.     Motor: No weakness.    Psychiatric:        Mood and Affect: Mood normal.        (all labs ordered are listed, but only abnormal results are displayed)      Labs Reviewed  COMPREHENSIVE METABOLIC  PANEL WITH GFR - Abnormal; Notable for the following components:      Result Value     CO2 21 (*)      Glucose, Bld 102 (*)      Calcium  8.7 (*)      Total Protein 6.3 (*)      Total Bilirubin 1.4 (*)      All other components within normal limits  URINALYSIS, ROUTINE W REFLEX MICROSCOPIC - Abnormal; Notable for the following components:    Specific Gravity, Urine >1.046 (*)      Glucose, UA >=500 (*)      Ketones, ur 5 (*)      Bacteria, UA RARE (*)      All other components within normal limits  PROTIME-INR - Abnormal; Notable for the following components:    Prothrombin Time 15.3 (*)      All other components within normal limits  I-STAT CHEM 8, ED - Abnormal; Notable for the following components:    Glucose, Bld 100 (*)      Calcium , Ion 1.11 (*)      TCO2 21 (*)      All other components within normal limits  CBC  ETHANOL  I-STAT CG4 LACTIC ACID, ED  SAMPLE TO BLOOD BANK      EKG: None   Radiology:  Imaging Results (Last 48 hours)  CT HEAD WO CONTRAST Result Date: 05/02/2024 CLINICAL DATA:  Trauma, fall EXAM: CT HEAD WITHOUT CONTRAST CT CERVICAL SPINE WITHOUT CONTRAST TECHNIQUE: Multidetector CT imaging of the head and cervical spine was performed following the standard protocol without intravenous contrast. Multiplanar CT image reconstructions of the cervical spine were also generated. RADIATION DOSE REDUCTION: This exam was performed according to the departmental dose-optimization program which includes automated exposure control, adjustment of the mA and/or kV according to patient size and/or use of iterative reconstruction technique. COMPARISON:  None Available. FINDINGS: CT HEAD FINDINGS Brain: No evidence of acute infarction, hemorrhage, hydrocephalus, extra-axial collection or mass lesion/mass effect. Vascular: No hyperdense vessel or unexpected calcification. Skull: Normal. Negative for fracture or focal lesion. Sinuses/Orbits: Hyperdensity of the left vitreous (series  3, image 8). Other: Scalp laceration, hematoma, and stapling of the left scalp vertex (series 3, image 33). CT CERVICAL SPINE FINDINGS Alignment: Degenerative straightening of the normal cervical lordosis. Skull base and vertebrae: No acute fracture. No primary bone lesion or focal pathologic process.  Soft tissues and spinal canal: No prevertebral fluid or swelling. No visible canal hematoma. Disc levels: Moderate multilevel cervical disc degenerative disease, worst from C4-C6. Upper chest: Negative. Other: None. IMPRESSION: 1. No acute intracranial pathology. 2. Scalp laceration, hematoma, and stapling of the left scalp vertex. 3. Hyperdensity of the left vitreous humerus, presumably reflecting vitreous silicone injection. 4. No fracture or static subluxation of the cervical spine. 5. 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 Electronically Signed   By: Marolyn JONETTA Jaksch M.D.   On: 05/02/2024 17:25    CT CERVICAL SPINE WO CONTRAST Result Date: 05/02/2024 CLINICAL DATA:  Trauma, fall EXAM: CT HEAD WITHOUT CONTRAST CT CERVICAL SPINE WITHOUT CONTRAST TECHNIQUE: Multidetector CT imaging of the head and cervical spine was performed following the standard protocol without intravenous contrast. Multiplanar CT image reconstructions of the cervical spine were also generated. RADIATION DOSE REDUCTION: This exam was performed according to the departmental dose-optimization program which includes automated exposure control, adjustment of the mA and/or kV according to patient size and/or use of iterative reconstruction technique. COMPARISON:  None Available. FINDINGS: CT HEAD FINDINGS Brain: No evidence of acute infarction, hemorrhage, hydrocephalus, extra-axial collection or mass lesion/mass effect. Vascular: No hyperdense vessel or unexpected calcification. Skull: Normal. Negative for fracture or focal lesion. Sinuses/Orbits: Hyperdensity of the left vitreous (series 3, image 8). Other: Scalp  laceration, hematoma, and stapling of the left scalp vertex (series 3, image 33). CT CERVICAL SPINE FINDINGS Alignment: Degenerative straightening of the normal cervical lordosis. Skull base and vertebrae: No acute fracture. No primary bone lesion or focal pathologic process. Soft tissues and spinal canal: No prevertebral fluid or swelling. No visible canal hematoma. Disc levels: Moderate multilevel cervical disc degenerative disease, worst from C4-C6. Upper chest: Negative. Other: None. IMPRESSION: 1. No acute intracranial pathology. 2. Scalp laceration, hematoma, and stapling of the left scalp vertex. 3. Hyperdensity of the left vitreous humerus, presumably reflecting vitreous silicone injection. 4. No fracture or static subluxation of the cervical spine. 5. 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 Electronically Signed   By: Marolyn JONETTA Jaksch M.D.   On: 05/02/2024 17:25    CT CHEST ABDOMEN PELVIS W CONTRAST Result Date: 05/02/2024 CLINICAL DATA:  Trauma, fall EXAM: CT CHEST, ABDOMEN, AND PELVIS WITH CONTRAST CT THORACIC AND LUMBAR SPINE WITH CONTRAST TECHNIQUE: Multidetector CT imaging of the chest, abdomen and pelvis was performed following the standard protocol during bolus administration of intravenous contrast. Multidetector CT imaging of the thoracic and lumbar spine was performed following the standard protocol during bolus administration of intravenous contrast. RADIATION DOSE REDUCTION: This exam was performed according to the departmental dose-optimization program which includes automated exposure control, adjustment of the mA and/or kV according to patient size and/or use of iterative reconstruction technique. CONTRAST:  75mL OMNIPAQUE  IOHEXOL  350 MG/ML SOLN COMPARISON:  None Available. FINDINGS: CT CHEST FINDINGS Cardiovascular: Aortic atherosclerosis. Cardiomegaly. Three-vessel coronary artery calcifications. No pericardial effusion. Scattered venous air insufflation in  the right shoulder and lower right neck. Mediastinum/Nodes: No enlarged mediastinal, hilar, or axillary lymph nodes. Thyroid  gland, trachea, and esophagus demonstrate no significant findings. Lungs/Pleura: Moderate right, small left pleural effusions and associated atelectasis or consolidation. Mild diffuse interlobular septal thickening throughout. Mild diffuse bronchial wall thickening. Musculoskeletal: No chest wall mass or suspicious osseous lesions identified. CT ABDOMEN PELVIS FINDINGS Hepatobiliary: No focal liver abnormality is seen. Status post cholecystectomy. No biliary dilatation. Pancreas: Unremarkable. No pancreatic ductal dilatation or surrounding inflammatory changes. Spleen: Normal in size without significant abnormality.  Adrenals/Urinary Tract: Adrenal glands are unremarkable. Kidneys are normal, without renal calculi, solid lesion, or hydronephrosis. Bladder is unremarkable. Stomach/Bowel: Stomach is within normal limits. Appendix not clearly visualized. No evidence of bowel wall thickening, distention, or inflammatory changes. Severe pancolonic diverticulosis. Vascular/Lymphatic: Aortic atherosclerosis. 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 No enlarged abdominal or pelvic lymph nodes. Reproductive: No mass or other abnormality. Other: No abdominal wall hernia or abnormality. No ascites. Musculoskeletal: No acute osseous findings. CT THORACIC AND LUMBAR SPINE FINDINGS Alignment: Normal thoracic kyphosis. Normal lumbar lordosis. Vertebral bodies: Intact. No fracture or dislocation. 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 Generally mild disc space height loss throughout the thoracic and lumbar spine, focally severe at L5-S1. Paraspinous soft tissues: Unremarkable. IMPRESSION: 1. No CT evidence of acute traumatic injury to the chest, abdomen, or pelvis. 2. Moderate right, small left pleural  effusions and associated atelectasis or consolidation as well as interstitial edema. 3. Cardiomegaly and coronary artery disease. 4. 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 Recommend follow-up ultrasound every 2 years if not otherwise imaged and if clinically appropriate. 5. Severe pancolonic diverticulosis without evidence of acute diverticulitis. 6. No fracture or dislocation of the thoracic or lumbar spine. 7. 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 Generally mild disc space height loss throughout the thoracic and lumbar spine, focally severe at L5-S1. Aortic Atherosclerosis (ICD10-I70.0). Electronically Signed   By: Marolyn JONETTA Jaksch M.D.   On: 05/02/2024 17:22    CT L-SPINE NO CHARGE Result Date: 05/02/2024 CLINICAL DATA:  Trauma, fall EXAM: CT CHEST, ABDOMEN, AND PELVIS WITH CONTRAST CT THORACIC AND LUMBAR SPINE WITH CONTRAST TECHNIQUE: Multidetector CT imaging of the chest, abdomen and pelvis was performed following the standard protocol during bolus administration of intravenous contrast. Multidetector CT imaging of the thoracic and lumbar spine was performed following the standard protocol during bolus administration of intravenous contrast. RADIATION DOSE REDUCTION: This exam was performed according to the departmental dose-optimization program which includes automated exposure control, adjustment of the mA and/or kV according to patient size and/or use of iterative reconstruction technique. CONTRAST:  75mL OMNIPAQUE  IOHEXOL  350 MG/ML SOLN COMPARISON:  None Available. FINDINGS: CT CHEST FINDINGS Cardiovascular: Aortic atherosclerosis. Cardiomegaly. Three-vessel coronary artery calcifications. No pericardial effusion. Scattered venous air insufflation in the right shoulder and lower right neck. Mediastinum/Nodes: No enlarged mediastinal, hilar, or axillary lymph nodes. Thyroid  gland, trachea, and esophagus  demonstrate no significant findings. Lungs/Pleura: Moderate right, small left pleural effusions and associated atelectasis or consolidation. Mild diffuse interlobular septal thickening throughout. Mild diffuse bronchial wall thickening. Musculoskeletal: No chest wall mass or suspicious osseous lesions identified. CT ABDOMEN PELVIS FINDINGS Hepatobiliary: No focal liver abnormality is seen. Status post cholecystectomy. No biliary dilatation. Pancreas: Unremarkable. No pancreatic ductal dilatation or surrounding inflammatory changes. Spleen: Normal in size without significant abnormality. Adrenals/Urinary Tract: Adrenal glands are unremarkable. Kidneys are normal, without renal calculi, solid lesion, or hydronephrosis. Bladder is unremarkable. Stomach/Bowel: Stomach is within normal limits. Appendix not clearly visualized. No evidence of bowel wall thickening, distention, or inflammatory changes. Severe pancolonic diverticulosis. Vascular/Lymphatic: Aortic atherosclerosis. 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 No enlarged abdominal or pelvic lymph nodes. Reproductive: No mass or other abnormality. Other: No abdominal wall hernia or abnormality. No ascites. Musculoskeletal: No acute osseous findings. CT THORACIC AND LUMBAR SPINE FINDINGS Alignment: Normal thoracic  kyphosis. Normal lumbar lordosis. Vertebral bodies: Intact. No fracture or dislocation. 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 Generally mild disc space height loss throughout the thoracic and lumbar spine, focally severe at L5-S1. Paraspinous soft tissues: Unremarkable. IMPRESSION: 1. No CT evidence of acute traumatic injury to the chest, abdomen, or pelvis. 2. Moderate right, small left pleural effusions and associated atelectasis or consolidation as well as interstitial edema. 3. Cardiomegaly and coronary artery disease. 4. Aneurysm of the  infrarenal abdominal aorta measuring up to 3.5 x 3.4 cm. Recommend follow-up ultrasound every 2 years if not otherwise imaged and if clinically appropriate. 5. Severe pancolonic diverticulosis without evidence of acute diverticulitis. 6. No fracture or dislocation of the thoracic or lumbar spine. 7. 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 Generally mild disc space height loss throughout the thoracic and lumbar spine, focally severe at L5-S1. Aortic Atherosclerosis (ICD10-I70.0). Electronically Signed   By: Marolyn JONETTA Jaksch M.D.   On: 05/02/2024 17:22    CT T-SPINE NO CHARGE Result Date: 05/02/2024 CLINICAL DATA:  Trauma, fall EXAM: CT CHEST, ABDOMEN, AND PELVIS WITH CONTRAST CT THORACIC AND LUMBAR SPINE WITH CONTRAST TECHNIQUE: Multidetector CT imaging of the chest, abdomen and pelvis was performed following the standard protocol during bolus administration of intravenous contrast. Multidetector CT imaging of the thoracic and lumbar spine was performed following the standard protocol during bolus administration of intravenous contrast. RADIATION DOSE REDUCTION: This exam was performed according to the departmental dose-optimization program which includes automated exposure control, adjustment of the mA and/or kV according to patient size and/or use of iterative reconstruction technique. CONTRAST:  75mL OMNIPAQUE  IOHEXOL  350 MG/ML SOLN COMPARISON:  None Available. FINDINGS: CT CHEST FINDINGS Cardiovascular: Aortic atherosclerosis. Cardiomegaly. Three-vessel coronary artery calcifications. No pericardial effusion. Scattered venous air insufflation in the right shoulder and lower right neck. Mediastinum/Nodes: No enlarged mediastinal, hilar, or axillary lymph nodes. Thyroid  gland, trachea, and esophagus demonstrate no significant findings. Lungs/Pleura: Moderate right, small left pleural effusions and associated atelectasis or consolidation. Mild diffuse  interlobular septal thickening throughout. Mild diffuse bronchial wall thickening. Musculoskeletal: No chest wall mass or suspicious osseous lesions identified. CT ABDOMEN PELVIS FINDINGS Hepatobiliary: No focal liver abnormality is seen. Status post cholecystectomy. No biliary dilatation. Pancreas: Unremarkable. No pancreatic ductal dilatation or surrounding inflammatory changes. Spleen: Normal in size without significant abnormality. Adrenals/Urinary Tract: Adrenal glands are unremarkable. Kidneys are normal, without renal calculi, solid lesion, or hydronephrosis. Bladder is unremarkable. Stomach/Bowel: Stomach is within normal limits. Appendix not clearly visualized. No evidence of bowel wall thickening, distention, or inflammatory changes. Severe pancolonic diverticulosis. Vascular/Lymphatic: Aortic atherosclerosis. 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 No enlarged abdominal or pelvic lymph nodes. Reproductive: No mass or other abnormality. Other: No abdominal wall hernia or abnormality. No ascites. Musculoskeletal: No acute osseous findings. CT THORACIC AND LUMBAR SPINE FINDINGS Alignment: Normal thoracic kyphosis. Normal lumbar lordosis. Vertebral bodies: Intact. No fracture or dislocation. 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 Generally mild disc space height loss throughout the thoracic and lumbar spine, focally severe at L5-S1. Paraspinous soft tissues: Unremarkable. IMPRESSION: 1. No CT evidence of acute traumatic injury to the chest, abdomen, or pelvis. 2. Moderate right, small left pleural effusions and associated atelectasis or consolidation as well as interstitial edema. 3. Cardiomegaly and coronary artery disease. 4. Aneurysm of the infrarenal abdominal aorta measuring up to  3.5 x 3.4 cm. Recommend follow-up ultrasound every 2 years if not otherwise imaged and if clinically appropriate. 5.  Severe pancolonic diverticulosis without evidence of acute diverticulitis. 6. No fracture or dislocation of the thoracic or lumbar spine. 7. 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 Generally mild disc space height loss throughout the thoracic and lumbar spine, focally severe at L5-S1. Aortic Atherosclerosis (ICD10-I70.0). Electronically Signed   By: Marolyn JONETTA Jaksch M.D.   On: 05/02/2024 17:22    DG Pelvis Portable Result Date: 05/02/2024 CLINICAL DATA:  Trauma, fall. EXAM: PORTABLE PELVIS 1-2 VIEWS COMPARISON:  None Available. FINDINGS: The cortical margins of the bony pelvis are intact. No fracture. Pubic symphysis and sacroiliac joints are congruent. Both femoral heads are well-seated in the respective acetabula. Mild bilateral hip osteoarthritis. IMPRESSION: No pelvic fracture. Electronically Signed   By: Andrea Gasman M.D.   On: 05/02/2024 16:55    DG Chest Port 1 View Result Date: 05/02/2024 CLINICAL DATA:  Trauma, mechanical fall. EXAM: PORTABLE CHEST 1 VIEW COMPARISON:  12/25/2023 FINDINGS: Cardiomegaly is unchanged. Stable mediastinal contours with aortic atherosclerosis. Chronic blunting of right costophrenic angle. No pneumothorax. No confluent consolidation. On limited assessment, no acute osseous findings. IMPRESSION: 1. No acute traumatic findings. 2. Unchanged cardiomegaly. Unchanged blunting of right costophrenic angle. Electronically Signed   By: Andrea Gasman M.D.   On: 05/02/2024 16:54         Ultrasound ED FAST   Date/Time: 05/02/2024 4:54 PM   Performed by: Pamella Ozell LABOR, DO Authorized by: Pamella Ozell LABOR, DO  Procedure details:    Indications: blunt abdominal trauma and blunt chest trauma       Assess for:  Hemothorax, intra-abdominal fluid, pericardial effusion and pneumothorax    Technique:  Abdominal, chest and cardiac    Images: archived       Abdominal findings:    L kidney:  Visualized   R kidney:   Visualized   Liver:  Visualized     Bladder:  Visualized, Foley catheter not visualized   Hepatorenal space visualized: identified     Splenorenal space: identified     Rectovesical free fluid: not identified     Splenorenal free fluid: not identified     Hepatorenal space free fluid: not identified   Cardiac findings:    Heart:  Visualized   Wall motion: identified     Pericardial effusion: identified   Chest findings:    L lung sliding: identified     R lung sliding: identified     Fluid in thorax: not identified   Comments:     Moderate pericardial effusion, chronic E fast otherwise negative .Laceration Repair   Date/Time: 05/02/2024 9:12 PM   Performed by: Pamella Ozell LABOR, DO Authorized by: Pamella Ozell LABOR, DO   Consent:    Consent obtained:  Verbal   Consent given by:  Patient   Risks discussed:  Infection, need for additional repair, nerve damage, poor wound healing and vascular damage   Alternatives discussed:  No treatment Universal protocol:    Immediately prior to procedure, a time out was called: yes     Patient identity confirmed:  Verbally with patient Anesthesia:    Anesthesia method:  None Laceration details:    Location:  Scalp   Scalp location:  L parietal   Length (cm):  3   Depth (mm):  0.5 Pre-procedure details:    Preparation:  Patient was prepped and draped in usual sterile  fashion Exploration:    Contaminated: no   Treatment:    Area cleansed with:  Saline   Amount of cleaning:  Extensive   Irrigation volume:  20   Irrigation method:  Pressure wash Skin repair:    Repair method:  Staples   Number of staples:  2 Approximation:    Approximation:  Close Repair type:    Repair type:  Simple Post-procedure details:    Dressing:  Open (no dressing)   Procedure completion:  Tolerated     Medications Ordered in the ED  iohexol  (OMNIPAQUE ) 350 MG/ML injection 75 mL (75 mLs Intravenous Contrast Given 05/02/24 1703)         Clinical  Course as of 05/02/24 2113  Mon May 02, 2024  2111 Laboratory workup unremarkable.  No acute traumatic findings on CT head C-spine chest abdomen pelvis T-spine or L-spine.  Chest x-ray pelvis x-ray look okay.  I informed patient about infrarenal abdominal aortic aneurysm that require follow-up.  He is stable for discharge at this time [MP]     Clinical Course User Index [MP] Pamella Ozell LABOR, DO                                  Medical Decision Making 78 year old male with history as above presenting as a level 2 trauma given concern for fall on thinners.  Clemens out doors.  Mechanical in nature.  Obvious head trauma with laceration.  Laceration repaired with 2 staples.  Abrasion over back left forearm.  Full active range of motion.  Hemodynamically stable.  Given fall on thinners with obvious head upper back trauma will obtain CT pan scan to evaluate for traumatic injury and labs per trauma panel.  E-FAST negative   Amount and/or Complexity of Data Reviewed Labs: ordered. Radiology: ordered.   Risk Prescription drug management.             Final diagnoses:  Infrarenal abdominal aortic aneurysm (AAA) without rupture (HCC)  Fall, initial encounter  Laceration of scalp, initial encounter       Outpatient Medications Prior to Visit  Medication Sig Dispense Refill   apixaban  (ELIQUIS ) 5 MG TABS tablet Take 1 tablet (5 mg total) by mouth 2 (two) times daily. 60 tablet 11   atorvastatin  (LIPITOR ) 20 MG tablet Take 1 tablet (20 mg total) by mouth daily. 90 tablet 2   brimonidine (ALPHAGAN P) 0.1 % SOLN Place into the left eye daily.     cyclobenzaprine  (FLEXERIL ) 5 MG tablet Take 1 tablet (5 mg total) by mouth 3 (three) times daily as needed for muscle spasms. 30 tablet 0   dapagliflozin  propanediol (FARXIGA ) 10 MG TABS tablet Take 1 tablet (10 mg total) by mouth daily. 90 tablet 1   dorzolamide-timolol (COSOPT) 22.3-6.8 MG/ML ophthalmic solution Place 1 drop into the left eye 2 (two)  times daily.     losartan  (COZAAR ) 25 MG tablet Take 0.5 tablets (12.5 mg total) by mouth daily. 45 tablet 2   metoprolol  succinate (TOPROL -XL) 25 MG 24 hr tablet Take 0.5 tablets (12.5 mg total) by mouth daily. 45 tablet 2   prednisoLONE acetate (PRED FORTE) 1 % ophthalmic suspension Place 1 drop into the left eye daily at 6 (six) AM.     No facility-administered medications prior to visit.    ROS: Review of Systems  Constitutional:  Positive for fatigue. Negative for appetite change and unexpected weight change.  HENT:  Negative for congestion, nosebleeds, sneezing, sore throat and trouble swallowing.   Eyes:  Negative for itching and visual disturbance.  Respiratory:  Negative for cough.   Cardiovascular:  Negative for chest pain, palpitations and leg swelling.  Gastrointestinal:  Negative for abdominal distention, blood in stool, diarrhea and nausea.  Genitourinary:  Negative for frequency and hematuria.  Musculoskeletal:  Positive for arthralgias, back pain, gait problem and neck stiffness. Negative for joint swelling and neck pain.  Skin:  Positive for wound. Negative for color change and rash.  Neurological:  Positive for weakness. Negative for dizziness, tremors, seizures, speech difficulty and light-headedness.  Hematological:  Bruises/bleeds easily.  Psychiatric/Behavioral:  Positive for decreased concentration. Negative for agitation, dysphoric mood and sleep disturbance. The patient is not nervous/anxious.     Objective:  BP 108/70   Pulse 89   Temp 98 F (36.7 C) (Temporal)   Ht 5' 11 (1.803 m)   Wt 196 lb 6 oz (89.1 kg)   SpO2 97%   BMI 27.39 kg/m   BP Readings from Last 3 Encounters:  05/12/24 108/70  05/02/24 138/79  04/11/24 110/70    Wt Readings from Last 3 Encounters:  05/12/24 196 lb 6 oz (89.1 kg)  05/02/24 187 lb (84.8 kg)  04/11/24 191 lb (86.6 kg)    Physical Exam Constitutional:      General: He is not in acute distress.    Appearance: He is  well-developed.     Comments: NAD  Eyes:     Conjunctiva/sclera: Conjunctivae normal.     Pupils: Pupils are equal, round, and reactive to light.  Neck:     Thyroid : No thyromegaly.     Vascular: No JVD.  Cardiovascular:     Rate and Rhythm: Normal rate and regular rhythm.     Heart sounds: Normal heart sounds. No murmur heard.    No friction rub. No gallop.  Pulmonary:     Effort: Pulmonary effort is normal. No respiratory distress.     Breath sounds: Normal breath sounds. No wheezing or rales.  Chest:     Chest wall: No tenderness.  Abdominal:     General: Bowel sounds are normal. There is no distension.     Palpations: Abdomen is soft. There is no mass.     Tenderness: There is no abdominal tenderness. There is no guarding or rebound.  Musculoskeletal:        General: Swelling, tenderness, deformity and signs of injury present. Normal range of motion.     Cervical back: Normal range of motion.     Right lower leg: Edema present.     Left lower leg: Edema present.  Lymphadenopathy:     Cervical: No cervical adenopathy.  Skin:    General: Skin is warm and dry.     Findings: No rash.  Neurological:     Mental Status: He is alert and oriented to person, place, and time.     Cranial Nerves: No cranial nerve deficit.     Motor: No abnormal muscle tone.     Coordination: Coordination normal.     Gait: Gait normal.     Deep Tendon Reflexes: Reflexes are normal and symmetric.  Psychiatric:        Behavior: Behavior normal.        Thought Content: Thought content normal.        Judgment: Judgment normal.    Scalp laceration w/2 staples - removed Large laceration on the L forearm - dressed,  ACE wrap  Trace ankle edema Ataxic gait LS, neck - stiff    Lab Results  Component Value Date   WBC 6.3 05/02/2024   HGB 14.6 05/02/2024   HCT 43.0 05/02/2024   PLT 163 05/02/2024   GLUCOSE 100 (H) 05/02/2024   CHOL 180 07/30/2022   TRIG 141.0 07/30/2022   HDL 85.80 07/30/2022    LDLCALC 66 07/30/2022   ALT 12 05/02/2024   AST 18 05/02/2024   NA 139 05/02/2024   K 4.0 05/02/2024   CL 104 05/02/2024   CREATININE 0.80 05/02/2024   BUN 10 05/02/2024   CO2 21 (L) 05/02/2024   TSH 4.20 11/18/2023   PSA 1.22 04/21/2023   INR 1.1 05/02/2024   HGBA1C 5.8 11/18/2023    CT HEAD WO CONTRAST Result Date: 05/02/2024 CLINICAL DATA:  Trauma, fall EXAM: CT HEAD WITHOUT CONTRAST CT CERVICAL SPINE WITHOUT CONTRAST TECHNIQUE: Multidetector CT imaging of the head and cervical spine was performed following the standard protocol without intravenous contrast. Multiplanar CT image reconstructions of the cervical spine were also generated. RADIATION DOSE REDUCTION: This exam was performed according to the departmental dose-optimization program which includes automated exposure control, adjustment of the mA and/or kV according to patient size and/or use of iterative reconstruction technique. COMPARISON:  None Available. FINDINGS: CT HEAD FINDINGS Brain: No evidence of acute infarction, hemorrhage, hydrocephalus, extra-axial collection or mass lesion/mass effect. Vascular: No hyperdense vessel or unexpected calcification. Skull: Normal. Negative for fracture or focal lesion. Sinuses/Orbits: Hyperdensity of the left vitreous (series 3, image 8). Other: Scalp laceration, hematoma, and stapling of the left scalp vertex (series 3, image 33). CT CERVICAL SPINE FINDINGS Alignment: Degenerative straightening of the normal cervical lordosis. Skull base and vertebrae: No acute fracture. No primary bone lesion or focal pathologic process. Soft tissues and spinal canal: No prevertebral fluid or swelling. No visible canal hematoma. Disc levels: Moderate multilevel cervical disc degenerative disease, worst from C4-C6. Upper chest: Negative. Other: None. IMPRESSION: 1. No acute intracranial pathology. 2. Scalp laceration, hematoma, and stapling of the left scalp vertex. 3. Hyperdensity of the left vitreous  humerus, presumably reflecting vitreous silicone injection. 4. No fracture or static subluxation of the cervical spine. 5. 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 Electronically Signed   By: Marolyn JONETTA Jaksch M.D.   On: 05/02/2024 17:25   CT CERVICAL SPINE WO CONTRAST Result Date: 05/02/2024 CLINICAL DATA:  Trauma, fall EXAM: CT HEAD WITHOUT CONTRAST CT CERVICAL SPINE WITHOUT CONTRAST TECHNIQUE: Multidetector CT imaging of the head and cervical spine was performed following the standard protocol without intravenous contrast. Multiplanar CT image reconstructions of the cervical spine were also generated. RADIATION DOSE REDUCTION: This exam was performed according to the departmental dose-optimization program which includes automated exposure control, adjustment of the mA and/or kV according to patient size and/or use of iterative reconstruction technique. COMPARISON:  None Available. FINDINGS: CT HEAD FINDINGS Brain: No evidence of acute infarction, hemorrhage, hydrocephalus, extra-axial collection or mass lesion/mass effect. Vascular: No hyperdense vessel or unexpected calcification. Skull: Normal. Negative for fracture or focal lesion. Sinuses/Orbits: Hyperdensity of the left vitreous (series 3, image 8). Other: Scalp laceration, hematoma, and stapling of the left scalp vertex (series 3, image 33). CT CERVICAL SPINE FINDINGS Alignment: Degenerative straightening of the normal cervical lordosis. Skull base and vertebrae: No acute fracture. No primary bone lesion or focal pathologic process. Soft tissues and spinal canal: No prevertebral fluid or swelling. No visible canal hematoma. Disc levels: Moderate multilevel cervical disc degenerative  disease, worst from C4-C6. Upper chest: Negative. Other: None. IMPRESSION: 1. No acute intracranial pathology. 2. Scalp laceration, hematoma, and stapling of the left scalp vertex. 3. Hyperdensity of the left vitreous humerus, presumably  reflecting vitreous silicone injection. 4. No fracture or static subluxation of the cervical spine. 5. 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 Electronically Signed   By: Marolyn JONETTA Jaksch M.D.   On: 05/02/2024 17:25   CT CHEST ABDOMEN PELVIS W CONTRAST Result Date: 05/02/2024 CLINICAL DATA:  Trauma, fall EXAM: CT CHEST, ABDOMEN, AND PELVIS WITH CONTRAST CT THORACIC AND LUMBAR SPINE WITH CONTRAST TECHNIQUE: Multidetector CT imaging of the chest, abdomen and pelvis was performed following the standard protocol during bolus administration of intravenous contrast. Multidetector CT imaging of the thoracic and lumbar spine was performed following the standard protocol during bolus administration of intravenous contrast. RADIATION DOSE REDUCTION: This exam was performed according to the departmental dose-optimization program which includes automated exposure control, adjustment of the mA and/or kV according to patient size and/or use of iterative reconstruction technique. CONTRAST:  75mL OMNIPAQUE  IOHEXOL  350 MG/ML SOLN COMPARISON:  None Available. FINDINGS: CT CHEST FINDINGS Cardiovascular: Aortic atherosclerosis. Cardiomegaly. Three-vessel coronary artery calcifications. No pericardial effusion. Scattered venous air insufflation in the right shoulder and lower right neck. Mediastinum/Nodes: No enlarged mediastinal, hilar, or axillary lymph nodes. Thyroid  gland, trachea, and esophagus demonstrate no significant findings. Lungs/Pleura: Moderate right, small left pleural effusions and associated atelectasis or consolidation. Mild diffuse interlobular septal thickening throughout. Mild diffuse bronchial wall thickening. Musculoskeletal: No chest wall mass or suspicious osseous lesions identified. CT ABDOMEN PELVIS FINDINGS Hepatobiliary: No focal liver abnormality is seen. Status post cholecystectomy. No biliary dilatation. Pancreas: Unremarkable. No pancreatic ductal dilatation or  surrounding inflammatory changes. Spleen: Normal in size without significant abnormality. Adrenals/Urinary Tract: Adrenal glands are unremarkable. Kidneys are normal, without renal calculi, solid lesion, or hydronephrosis. Bladder is unremarkable. Stomach/Bowel: Stomach is within normal limits. Appendix not clearly visualized. No evidence of bowel wall thickening, distention, or inflammatory changes. Severe pancolonic diverticulosis. Vascular/Lymphatic: Aortic atherosclerosis. 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 No enlarged abdominal or pelvic lymph nodes. Reproductive: No mass or other abnormality. Other: No abdominal wall hernia or abnormality. No ascites. Musculoskeletal: No acute osseous findings. CT THORACIC AND LUMBAR SPINE FINDINGS Alignment: Normal thoracic kyphosis. Normal lumbar lordosis. Vertebral bodies: Intact. No fracture or dislocation. 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 Generally mild disc space height loss throughout the thoracic and lumbar spine, focally severe at L5-S1. Paraspinous soft tissues: Unremarkable. IMPRESSION: 1. No CT evidence of acute traumatic injury to the chest, abdomen, or pelvis. 2. Moderate right, small left pleural effusions and associated atelectasis or consolidation as well as interstitial edema. 3. Cardiomegaly and coronary artery disease. 4. 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 Recommend follow-up ultrasound every 2 years if not otherwise imaged and if clinically appropriate. 5. Severe pancolonic diverticulosis without evidence of acute diverticulitis. 6. No fracture or dislocation of the thoracic or lumbar spine. 7. 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 Generally mild disc space height loss throughout the thoracic and lumbar spine, focally severe at L5-S1. Aortic  Atherosclerosis (ICD10-I70.0). Electronically Signed   By: Marolyn JONETTA Jaksch M.D.   On: 05/02/2024 17:22   CT L-SPINE NO CHARGE Result Date: 05/02/2024 CLINICAL DATA:  Trauma, fall EXAM: CT CHEST, ABDOMEN, AND PELVIS  WITH CONTRAST CT THORACIC AND LUMBAR SPINE WITH CONTRAST TECHNIQUE: Multidetector CT imaging of the chest, abdomen and pelvis was performed following the standard protocol during bolus administration of intravenous contrast. Multidetector CT imaging of the thoracic and lumbar spine was performed following the standard protocol during bolus administration of intravenous contrast. RADIATION DOSE REDUCTION: This exam was performed according to the departmental dose-optimization program which includes automated exposure control, adjustment of the mA and/or kV according to patient size and/or use of iterative reconstruction technique. CONTRAST:  75mL OMNIPAQUE  IOHEXOL  350 MG/ML SOLN COMPARISON:  None Available. FINDINGS: CT CHEST FINDINGS Cardiovascular: Aortic atherosclerosis. Cardiomegaly. Three-vessel coronary artery calcifications. No pericardial effusion. Scattered venous air insufflation in the right shoulder and lower right neck. Mediastinum/Nodes: No enlarged mediastinal, hilar, or axillary lymph nodes. Thyroid  gland, trachea, and esophagus demonstrate no significant findings. Lungs/Pleura: Moderate right, small left pleural effusions and associated atelectasis or consolidation. Mild diffuse interlobular septal thickening throughout. Mild diffuse bronchial wall thickening. Musculoskeletal: No chest wall mass or suspicious osseous lesions identified. CT ABDOMEN PELVIS FINDINGS Hepatobiliary: No focal liver abnormality is seen. Status post cholecystectomy. No biliary dilatation. Pancreas: Unremarkable. No pancreatic ductal dilatation or surrounding inflammatory changes. Spleen: Normal in size without significant abnormality. Adrenals/Urinary Tract: Adrenal glands are unremarkable. Kidneys are normal,  without renal calculi, solid lesion, or hydronephrosis. Bladder is unremarkable. Stomach/Bowel: Stomach is within normal limits. Appendix not clearly visualized. No evidence of bowel wall thickening, distention, or inflammatory changes. Severe pancolonic diverticulosis. Vascular/Lymphatic: Aortic atherosclerosis. 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 No enlarged abdominal or pelvic lymph nodes. Reproductive: No mass or other abnormality. Other: No abdominal wall hernia or abnormality. No ascites. Musculoskeletal: No acute osseous findings. CT THORACIC AND LUMBAR SPINE FINDINGS Alignment: Normal thoracic kyphosis. Normal lumbar lordosis. Vertebral bodies: Intact. No fracture or dislocation. 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 Generally mild disc space height loss throughout the thoracic and lumbar spine, focally severe at L5-S1. Paraspinous soft tissues: Unremarkable. IMPRESSION: 1. No CT evidence of acute traumatic injury to the chest, abdomen, or pelvis. 2. Moderate right, small left pleural effusions and associated atelectasis or consolidation as well as interstitial edema. 3. Cardiomegaly and coronary artery disease. 4. 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 Recommend follow-up ultrasound every 2 years if not otherwise imaged and if clinically appropriate. 5. Severe pancolonic diverticulosis without evidence of acute diverticulitis. 6. No fracture or dislocation of the thoracic or lumbar spine. 7. 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 Generally mild disc space height loss throughout the thoracic and lumbar spine, focally severe at L5-S1. Aortic Atherosclerosis (ICD10-I70.0). Electronically Signed   By: Marolyn JONETTA Jaksch M.D.   On: 05/02/2024 17:22   CT T-SPINE NO CHARGE Result Date: 05/02/2024 CLINICAL DATA:   Trauma, fall EXAM: CT CHEST, ABDOMEN, AND PELVIS WITH CONTRAST CT THORACIC AND LUMBAR SPINE WITH CONTRAST TECHNIQUE: Multidetector CT imaging of the chest, abdomen and pelvis was performed following the standard protocol during bolus administration of intravenous contrast. Multidetector CT imaging of the thoracic and lumbar spine was performed following the standard protocol during bolus administration of intravenous contrast. RADIATION DOSE REDUCTION: This exam was performed according to the departmental dose-optimization program which includes automated exposure control, adjustment of the mA and/or kV according to patient size and/or use of iterative reconstruction technique. CONTRAST:  75mL OMNIPAQUE  IOHEXOL  350 MG/ML SOLN COMPARISON:  None Available. FINDINGS: CT CHEST FINDINGS Cardiovascular: Aortic atherosclerosis. Cardiomegaly. Three-vessel coronary artery calcifications. No pericardial effusion. Scattered venous air insufflation in the right shoulder and lower right neck. Mediastinum/Nodes: No enlarged mediastinal, hilar, or axillary lymph nodes. Thyroid  gland, trachea, and esophagus demonstrate no significant findings. Lungs/Pleura: Moderate right, small left pleural effusions and associated atelectasis or consolidation. Mild diffuse interlobular septal thickening throughout. Mild diffuse bronchial wall thickening. Musculoskeletal: No chest wall mass or suspicious osseous lesions identified. CT ABDOMEN PELVIS FINDINGS Hepatobiliary: No focal liver abnormality is seen. Status post cholecystectomy. No biliary dilatation. Pancreas: Unremarkable. No pancreatic ductal dilatation or surrounding inflammatory changes. Spleen: Normal in size without significant abnormality. Adrenals/Urinary Tract: Adrenal glands are unremarkable. Kidneys are normal, without renal calculi, solid lesion, or hydronephrosis. Bladder is unremarkable. Stomach/Bowel: Stomach is within normal limits. Appendix not clearly visualized. No  evidence of bowel wall thickening, distention, or inflammatory changes. Severe pancolonic diverticulosis. Vascular/Lymphatic: Aortic atherosclerosis. 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 No enlarged abdominal or pelvic lymph nodes. Reproductive: No mass or other abnormality. Other: No abdominal wall hernia or abnormality. No ascites. Musculoskeletal: No acute osseous findings. CT THORACIC AND LUMBAR SPINE FINDINGS Alignment: Normal thoracic kyphosis. Normal lumbar lordosis. Vertebral bodies: Intact. No fracture or dislocation. 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 Generally mild disc space height loss throughout the thoracic and lumbar spine, focally severe at L5-S1. Paraspinous soft tissues: Unremarkable. IMPRESSION: 1. No CT evidence of acute traumatic injury to the chest, abdomen, or pelvis. 2. Moderate right, small left pleural effusions and associated atelectasis or consolidation as well as interstitial edema. 3. Cardiomegaly and coronary artery disease. 4. 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 Recommend follow-up ultrasound every 2 years if not otherwise imaged and if clinically appropriate. 5. Severe pancolonic diverticulosis without evidence of acute diverticulitis. 6. No fracture or dislocation of the thoracic or lumbar spine. 7. 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 Generally mild disc space height loss throughout the thoracic and lumbar spine, focally severe at L5-S1. Aortic Atherosclerosis (ICD10-I70.0). Electronically Signed   By: Marolyn JONETTA Jaksch M.D.   On: 05/02/2024 17:22   DG Pelvis Portable Result Date: 05/02/2024 CLINICAL DATA:  Trauma, fall. EXAM: PORTABLE PELVIS 1-2 VIEWS COMPARISON:  None Available. FINDINGS: The cortical margins of the bony pelvis are intact. No fracture. Pubic symphysis  and sacroiliac joints are congruent. Both femoral heads are well-seated in the respective acetabula. Mild bilateral hip osteoarthritis. IMPRESSION: No pelvic fracture. Electronically Signed   By: Andrea Gasman M.D.   On: 05/02/2024 16:55   DG Chest Port 1 View Result Date: 05/02/2024 CLINICAL DATA:  Trauma, mechanical fall. EXAM: PORTABLE CHEST 1 VIEW COMPARISON:  12/25/2023 FINDINGS: Cardiomegaly is unchanged. Stable mediastinal contours with aortic atherosclerosis. Chronic blunting of right costophrenic angle. No pneumothorax. No confluent consolidation. On limited assessment, no acute osseous findings. IMPRESSION: 1. No acute traumatic findings. 2. Unchanged cardiomegaly. Unchanged blunting of right costophrenic angle. Electronically Signed   By: Andrea Gasman M.D.   On: 05/02/2024 16:54    Assessment & Plan:   Problem List Items Addressed This Visit     Congestive heart failure (CHF) (HCC)   On Farxiga , Toprol , Eliquis , Losartan  Use Furosemide  prn      PMR (polymyalgia rheumatica) (HCC)   New  relapse. Start Prednisone  5 mg/d  Potential benefits of a long term  steroid  use as well as potential risks  and complications were explained to the patient and were aknowledged.      Relevant Orders   Ambulatory referral to Physical Therapy   Edema   On Farxiga , Toprol , Eliquis , Losartan  Use Furosemide  prn      Falls - Primary   Worsening stiffness New PMR relapse. Start Prednisone  5 mg/d  Potential benefits of a long term steroid  use as well as potential risks  and complications were explained to the patient and were aknowledged.      Relevant Orders   Ambulatory referral to Physical Therapy      Meds ordered this encounter  Medications   mupirocin  ointment (BACTROBAN ) 2 %    Sig: On leg wound w/dressing change qd or bid    Dispense:  30 g    Refill:  0   predniSONE  (DELTASONE ) 5 MG tablet    Sig: Take 1 tablet (5 mg total) by mouth daily with breakfast.    Dispense:   90 tablet    Refill:  1      Follow-up: Return in about 6 weeks (around 06/23/2024) for a follow-up visit.  Marolyn Noel, MD

## 2024-05-12 NOTE — Assessment & Plan Note (Addendum)
 Worsening stiffness New PMR relapse. Start Prednisone  5 mg/d  Potential benefits of a long term steroid  use as well as potential risks  and complications were explained to the patient and were aknowledged.

## 2024-05-12 NOTE — Assessment & Plan Note (Signed)
 On Farxiga , Toprol , Eliquis , Losartan  Use Furosemide  prn

## 2024-05-12 NOTE — Assessment & Plan Note (Addendum)
 New  relapse. Start Prednisone  5 mg/d  Potential benefits of a long term steroid  use as well as potential risks  and complications were explained to the patient and were aknowledged.

## 2024-06-20 ENCOUNTER — Ambulatory Visit (INDEPENDENT_AMBULATORY_CARE_PROVIDER_SITE_OTHER): Admitting: Internal Medicine

## 2024-06-20 VITALS — BP 99/64 | HR 87 | Temp 98.0°F | Ht 71.0 in | Wt 194.0 lb

## 2024-06-20 DIAGNOSIS — M1990 Unspecified osteoarthritis, unspecified site: Secondary | ICD-10-CM

## 2024-06-20 DIAGNOSIS — M353 Polymyalgia rheumatica: Secondary | ICD-10-CM

## 2024-06-20 DIAGNOSIS — R739 Hyperglycemia, unspecified: Secondary | ICD-10-CM | POA: Diagnosis not present

## 2024-06-20 DIAGNOSIS — I509 Heart failure, unspecified: Secondary | ICD-10-CM

## 2024-06-20 DIAGNOSIS — I10 Essential (primary) hypertension: Secondary | ICD-10-CM | POA: Diagnosis not present

## 2024-06-20 DIAGNOSIS — M25511 Pain in right shoulder: Secondary | ICD-10-CM

## 2024-06-20 DIAGNOSIS — M25512 Pain in left shoulder: Secondary | ICD-10-CM

## 2024-06-20 LAB — CBC WITH DIFFERENTIAL/PLATELET
Basophils Absolute: 0.1 K/uL (ref 0.0–0.1)
Basophils Relative: 1.3 % (ref 0.0–3.0)
Eosinophils Absolute: 0.1 K/uL (ref 0.0–0.7)
Eosinophils Relative: 1.7 % (ref 0.0–5.0)
HCT: 41.3 % (ref 39.0–52.0)
Hemoglobin: 13.5 g/dL (ref 13.0–17.0)
Lymphocytes Relative: 33.7 % (ref 12.0–46.0)
Lymphs Abs: 1.9 K/uL (ref 0.7–4.0)
MCHC: 32.8 g/dL (ref 30.0–36.0)
MCV: 92.5 fl (ref 78.0–100.0)
Monocytes Absolute: 0.6 K/uL (ref 0.1–1.0)
Monocytes Relative: 11 % (ref 3.0–12.0)
Neutro Abs: 3 K/uL (ref 1.4–7.7)
Neutrophils Relative %: 52.3 % (ref 43.0–77.0)
Platelets: 133 K/uL — ABNORMAL LOW (ref 150.0–400.0)
RBC: 4.46 Mil/uL (ref 4.22–5.81)
RDW: 14.9 % (ref 11.5–15.5)
WBC: 5.7 K/uL (ref 4.0–10.5)

## 2024-06-20 LAB — COMPREHENSIVE METABOLIC PANEL WITH GFR
ALT: 9 U/L (ref 0–53)
AST: 13 U/L (ref 0–37)
Albumin: 4 g/dL (ref 3.5–5.2)
Alkaline Phosphatase: 56 U/L (ref 39–117)
BUN: 11 mg/dL (ref 6–23)
CO2: 26 meq/L (ref 19–32)
Calcium: 8.9 mg/dL (ref 8.4–10.5)
Chloride: 105 meq/L (ref 96–112)
Creatinine, Ser: 0.72 mg/dL (ref 0.40–1.50)
GFR: 86.41 mL/min (ref >60.00)
Glucose, Bld: 91 mg/dL (ref 70–99)
Potassium: 4.4 meq/L (ref 3.5–5.1)
Sodium: 140 meq/L (ref 135–145)
Total Bilirubin: 1.1 mg/dL (ref 0.2–1.2)
Total Protein: 6.3 g/dL (ref 6.0–8.3)

## 2024-06-20 LAB — SEDIMENTATION RATE: Sed Rate: 4 mm/h (ref 0–20)

## 2024-06-20 NOTE — Assessment & Plan Note (Signed)
 Pt is not taking Farxiga  for ?reason. I asked him to re-start.

## 2024-06-20 NOTE — Assessment & Plan Note (Signed)
 MMR and OA. Pt is not taking Prednisone  for ?reason. I asked him to re-start.

## 2024-06-20 NOTE — Assessment & Plan Note (Signed)
 CHF. Pt is not taking Farxiga  for ?reason. I asked him to re-start.

## 2024-06-20 NOTE — Assessment & Plan Note (Signed)
 Pt is not taking Prednisone  for ?reason. I asked him to re-start.

## 2024-06-20 NOTE — Progress Notes (Signed)
 Subjective:  Patient ID: Dustin Clayton, male    DOB: 04/15/1946  Age: 78 y.o. MRN: [id number]  CC: Medical Management of Chronic Issues (6 week f/u )   HPI Dustin Clayton presents for PMR - not taking Prednisone  for ?reason. F/u on CHF. S/p fall In PT now  Outpatient Medications Prior to Visit  Medication Sig Dispense Refill   apixaban  (ELIQUIS ) 5 MG TABS tablet Take 1 tablet (5 mg total) by mouth 2 (two) times daily. 60 tablet 11   atorvastatin  (LIPITOR ) 20 MG tablet Take 1 tablet (20 mg total) by mouth daily. 90 tablet 2   brimonidine (ALPHAGAN P) 0.1 % SOLN Place into the left eye daily.     cyclobenzaprine  (FLEXERIL ) 5 MG tablet Take 1 tablet (5 mg total) by mouth 3 (three) times daily as needed for muscle spasms. 30 tablet 0   dapagliflozin  propanediol (FARXIGA ) 10 MG TABS tablet Take 1 tablet (10 mg total) by mouth daily. 90 tablet 1   dorzolamide-timolol (COSOPT) 22.3-6.8 MG/ML ophthalmic solution Place 1 drop into the left eye 2 (two) times daily.     losartan  (COZAAR ) 25 MG tablet Take 0.5 tablets (12.5 mg total) by mouth daily. 45 tablet 2   metoprolol  succinate (TOPROL -XL) 25 MG 24 hr tablet Take 0.5 tablets (12.5 mg total) by mouth daily. 45 tablet 2   mupirocin  ointment (BACTROBAN ) 2 % On leg wound w/dressing change qd or bid 30 g 0   prednisoLONE acetate (PRED FORTE) 1 % ophthalmic suspension Place 1 drop into the left eye daily at 6 (six) AM.     predniSONE  (DELTASONE ) 5 MG tablet Take 1 tablet (5 mg total) by mouth daily with breakfast. 90 tablet 1   No facility-administered medications prior to visit.    ROS: Review of Systems  Constitutional:  Positive for fatigue. Negative for appetite change and unexpected weight change.  HENT:  Negative for congestion, nosebleeds, sneezing, sore throat and trouble swallowing.   Eyes:  Negative for itching and visual disturbance.  Respiratory:  Positive for shortness of breath. Negative for cough.   Cardiovascular:  Positive  for leg swelling. Negative for chest pain and palpitations.  Gastrointestinal:  Negative for abdominal distention, blood in stool, diarrhea and nausea.  Genitourinary:  Negative for frequency and hematuria.  Musculoskeletal:  Positive for arthralgias and neck stiffness. Negative for back pain, gait problem, joint swelling and neck pain.  Skin:  Negative for rash.  Neurological:  Negative for dizziness, tremors, speech difficulty and weakness.  Hematological:  Bruises/bleeds easily.  Psychiatric/Behavioral:  Positive for decreased concentration. Negative for agitation, dysphoric mood, sleep disturbance and suicidal ideas. The patient is not nervous/anxious.     Objective:  BP 99/64   Pulse 87   Temp 98 F (36.7 C) (Oral)   Ht 5' 11 (1.803 m)   Wt 194 lb (88 kg)   SpO2 98%   BMI 27.06 kg/m   BP Readings from Last 3 Encounters:  06/20/24 99/64  05/12/24 108/70  05/02/24 138/79    Wt Readings from Last 3 Encounters:  06/20/24 194 lb (88 kg)  05/12/24 196 lb 6 oz (89.1 kg)  05/02/24 187 lb (84.8 kg)    Physical Exam Constitutional:      General: He is not in acute distress.    Appearance: He is well-developed. He is obese.     Comments: NAD  Eyes:     Conjunctiva/sclera: Conjunctivae normal.     Pupils: Pupils are equal, round, and reactive  to light.  Neck:     Thyroid : No thyromegaly.     Vascular: No JVD.  Cardiovascular:     Rate and Rhythm: Normal rate and regular rhythm.     Heart sounds: Normal heart sounds. No murmur heard.    No friction rub. No gallop.  Pulmonary:     Effort: Pulmonary effort is normal. No respiratory distress.     Breath sounds: Normal breath sounds. No wheezing or rales.  Chest:     Chest wall: No tenderness.  Abdominal:     General: Bowel sounds are normal. There is no distension.     Palpations: Abdomen is soft. There is no mass.     Tenderness: There is no abdominal tenderness. There is left CVA tenderness. There is no guarding or  rebound.  Musculoskeletal:        General: No tenderness. Normal range of motion.     Cervical back: Normal range of motion.     Right lower leg: Edema present.     Left lower leg: Edema present.  Lymphadenopathy:     Cervical: No cervical adenopathy.  Skin:    General: Skin is warm and dry.     Findings: No rash.  Neurological:     Mental Status: He is alert and oriented to person, place, and time.     Cranial Nerves: No cranial nerve deficit.     Motor: No abnormal muscle tone.     Coordination: Coordination abnormal.     Gait: Gait abnormal.     Deep Tendon Reflexes: Reflexes are normal and symmetric.  Psychiatric:        Behavior: Behavior normal.        Thought Content: Thought content normal.        Judgment: Judgment normal.   Trace to 1+ edema on ankles  Lab Results  Component Value Date   WBC 6.3 05/02/2024   HGB 14.6 05/02/2024   HCT 43.0 05/02/2024   PLT 163 05/02/2024   GLUCOSE 100 (H) 05/02/2024   CHOL 180 07/30/2022   TRIG 141.0 07/30/2022   HDL 85.80 07/30/2022   LDLCALC 66 07/30/2022   ALT 12 05/02/2024   AST 18 05/02/2024   NA 139 05/02/2024   K 4.0 05/02/2024   CL 104 05/02/2024   CREATININE 0.80 05/02/2024   BUN 10 05/02/2024   CO2 21 (L) 05/02/2024   TSH 4.20 11/18/2023   PSA 1.22 04/21/2023   INR 1.1 05/02/2024   HGBA1C 5.8 11/18/2023    CT HEAD WO CONTRAST Result Date: 05/02/2024 CLINICAL DATA:  Trauma, fall EXAM: CT HEAD WITHOUT CONTRAST CT CERVICAL SPINE WITHOUT CONTRAST TECHNIQUE: Multidetector CT imaging of the head and cervical spine was performed following the standard protocol without intravenous contrast. Multiplanar CT image reconstructions of the cervical spine were also generated. RADIATION DOSE REDUCTION: This exam was performed according to the departmental dose-optimization program which includes automated exposure control, adjustment of the mA and/or kV according to patient size and/or use of iterative reconstruction technique.  COMPARISON:  None Available. FINDINGS: CT HEAD FINDINGS Brain: No evidence of acute infarction, hemorrhage, hydrocephalus, extra-axial collection or mass lesion/mass effect. Vascular: No hyperdense vessel or unexpected calcification. Skull: Normal. Negative for fracture or focal lesion. Sinuses/Orbits: Hyperdensity of the left vitreous (series 3, image 8). Other: Scalp laceration, hematoma, and stapling of the left scalp vertex (series 3, image 33). CT CERVICAL SPINE FINDINGS Alignment: Degenerative straightening of the normal cervical lordosis. Skull base and vertebrae: No acute fracture.  No primary bone lesion or focal pathologic process. Soft tissues and spinal canal: No prevertebral fluid or swelling. No visible canal hematoma. Disc levels: Moderate multilevel cervical disc degenerative disease, worst from C4-C6. Upper chest: Negative. Other: None. IMPRESSION: 1. No acute intracranial pathology. 2. Scalp laceration, hematoma, and stapling of the left scalp vertex. 3. Hyperdensity of the left vitreous humerus, presumably reflecting vitreous silicone injection. 4. No fracture or static subluxation of the cervical spine. 5. 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 Electronically Signed   By: Marolyn JONETTA Jaksch M.D.   On: 05/02/2024 17:25   CT CERVICAL SPINE WO CONTRAST Result Date: 05/02/2024 CLINICAL DATA:  Trauma, fall EXAM: CT HEAD WITHOUT CONTRAST CT CERVICAL SPINE WITHOUT CONTRAST TECHNIQUE: Multidetector CT imaging of the head and cervical spine was performed following the standard protocol without intravenous contrast. Multiplanar CT image reconstructions of the cervical spine were also generated. RADIATION DOSE REDUCTION: This exam was performed according to the departmental dose-optimization program which includes automated exposure control, adjustment of the mA and/or kV according to patient size and/or use of iterative reconstruction technique. COMPARISON:  None Available.  FINDINGS: CT HEAD FINDINGS Brain: No evidence of acute infarction, hemorrhage, hydrocephalus, extra-axial collection or mass lesion/mass effect. Vascular: No hyperdense vessel or unexpected calcification. Skull: Normal. Negative for fracture or focal lesion. Sinuses/Orbits: Hyperdensity of the left vitreous (series 3, image 8). Other: Scalp laceration, hematoma, and stapling of the left scalp vertex (series 3, image 33). CT CERVICAL SPINE FINDINGS Alignment: Degenerative straightening of the normal cervical lordosis. Skull base and vertebrae: No acute fracture. No primary bone lesion or focal pathologic process. Soft tissues and spinal canal: No prevertebral fluid or swelling. No visible canal hematoma. Disc levels: Moderate multilevel cervical disc degenerative disease, worst from C4-C6. Upper chest: Negative. Other: None. IMPRESSION: 1. No acute intracranial pathology. 2. Scalp laceration, hematoma, and stapling of the left scalp vertex. 3. Hyperdensity of the left vitreous humerus, presumably reflecting vitreous silicone injection. 4. No fracture or static subluxation of the cervical spine. 5. 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 Electronically Signed   By: Marolyn JONETTA Jaksch M.D.   On: 05/02/2024 17:25   CT CHEST ABDOMEN PELVIS W CONTRAST Result Date: 05/02/2024 CLINICAL DATA:  Trauma, fall EXAM: CT CHEST, ABDOMEN, AND PELVIS WITH CONTRAST CT THORACIC AND LUMBAR SPINE WITH CONTRAST TECHNIQUE: Multidetector CT imaging of the chest, abdomen and pelvis was performed following the standard protocol during bolus administration of intravenous contrast. Multidetector CT imaging of the thoracic and lumbar spine was performed following the standard protocol during bolus administration of intravenous contrast. RADIATION DOSE REDUCTION: This exam was performed according to the departmental dose-optimization program which includes automated exposure control, adjustment of the mA and/or kV  according to patient size and/or use of iterative reconstruction technique. CONTRAST:  75mL OMNIPAQUE  IOHEXOL  350 MG/ML SOLN COMPARISON:  None Available. FINDINGS: CT CHEST FINDINGS Cardiovascular: Aortic atherosclerosis. Cardiomegaly. Three-vessel coronary artery calcifications. No pericardial effusion. Scattered venous air insufflation in the right shoulder and lower right neck. Mediastinum/Nodes: No enlarged mediastinal, hilar, or axillary lymph nodes. Thyroid  gland, trachea, and esophagus demonstrate no significant findings. Lungs/Pleura: Moderate right, small left pleural effusions and associated atelectasis or consolidation. Mild diffuse interlobular septal thickening throughout. Mild diffuse bronchial wall thickening. Musculoskeletal: No chest wall mass or suspicious osseous lesions identified. CT ABDOMEN PELVIS FINDINGS Hepatobiliary: No focal liver abnormality is seen. Status post cholecystectomy. No biliary dilatation. Pancreas: Unremarkable. No pancreatic ductal dilatation or surrounding inflammatory changes. Spleen:  Normal in size without significant abnormality. Adrenals/Urinary Tract: Adrenal glands are unremarkable. Kidneys are normal, without renal calculi, solid lesion, or hydronephrosis. Bladder is unremarkable. Stomach/Bowel: Stomach is within normal limits. Appendix not clearly visualized. No evidence of bowel wall thickening, distention, or inflammatory changes. Severe pancolonic diverticulosis. Vascular/Lymphatic: Aortic atherosclerosis. 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 No enlarged abdominal or pelvic lymph nodes. Reproductive: No mass or other abnormality. Other: No abdominal wall hernia or abnormality. No ascites. Musculoskeletal: No acute osseous findings. CT THORACIC AND LUMBAR SPINE FINDINGS Alignment: Normal thoracic kyphosis. Normal lumbar lordosis. Vertebral bodies: Intact. No fracture or dislocation. Disc spaces: Extensive bridging osteophytosis  and ankylosis throughout the thoracic spine, in keeping with DISH. Generally mild disc space height loss throughout the thoracic and lumbar spine, focally severe at L5-S1. Paraspinous soft tissues: Unremarkable. IMPRESSION: 1. No CT evidence of acute traumatic injury to the chest, abdomen, or pelvis. 2. Moderate right, small left pleural effusions and associated atelectasis or consolidation as well as interstitial edema. 3. Cardiomegaly and coronary artery disease. 4. 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 Recommend follow-up ultrasound every 2 years if not otherwise imaged and if clinically appropriate. 5. Severe pancolonic diverticulosis without evidence of acute diverticulitis. 6. No fracture or dislocation of the thoracic or lumbar spine. 7. 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 Generally mild disc space height loss throughout the thoracic and lumbar spine, focally severe at L5-S1. Aortic Atherosclerosis (ICD10-I70.0). Electronically Signed   By: Marolyn JONETTA Jaksch M.D.   On: 05/02/2024 17:22   CT L-SPINE NO CHARGE Result Date: 05/02/2024 CLINICAL DATA:  Trauma, fall EXAM: CT CHEST, ABDOMEN, AND PELVIS WITH CONTRAST CT THORACIC AND LUMBAR SPINE WITH CONTRAST TECHNIQUE: Multidetector CT imaging of the chest, abdomen and pelvis was performed following the standard protocol during bolus administration of intravenous contrast. Multidetector CT imaging of the thoracic and lumbar spine was performed following the standard protocol during bolus administration of intravenous contrast. RADIATION DOSE REDUCTION: This exam was performed according to the departmental dose-optimization program which includes automated exposure control, adjustment of the mA and/or kV according to patient size and/or use of iterative reconstruction technique. CONTRAST:  75mL OMNIPAQUE  IOHEXOL  350 MG/ML SOLN COMPARISON:  None Available. FINDINGS: CT  CHEST FINDINGS Cardiovascular: Aortic atherosclerosis. Cardiomegaly. Three-vessel coronary artery calcifications. No pericardial effusion. Scattered venous air insufflation in the right shoulder and lower right neck. Mediastinum/Nodes: No enlarged mediastinal, hilar, or axillary lymph nodes. Thyroid  gland, trachea, and esophagus demonstrate no significant findings. Lungs/Pleura: Moderate right, small left pleural effusions and associated atelectasis or consolidation. Mild diffuse interlobular septal thickening throughout. Mild diffuse bronchial wall thickening. Musculoskeletal: No chest wall mass or suspicious osseous lesions identified. CT ABDOMEN PELVIS FINDINGS Hepatobiliary: No focal liver abnormality is seen. Status post cholecystectomy. No biliary dilatation. Pancreas: Unremarkable. No pancreatic ductal dilatation or surrounding inflammatory changes. Spleen: Normal in size without significant abnormality. Adrenals/Urinary Tract: Adrenal glands are unremarkable. Kidneys are normal, without renal calculi, solid lesion, or hydronephrosis. Bladder is unremarkable. Stomach/Bowel: Stomach is within normal limits. Appendix not clearly visualized. No evidence of bowel wall thickening, distention, or inflammatory changes. Severe pancolonic diverticulosis. Vascular/Lymphatic: Aortic atherosclerosis. 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 No enlarged abdominal or pelvic lymph nodes. Reproductive: No mass or other abnormality. Other: No abdominal wall hernia or abnormality. No ascites. Musculoskeletal: No acute osseous findings. CT THORACIC AND LUMBAR  SPINE FINDINGS Alignment: Normal thoracic kyphosis. Normal lumbar lordosis. Vertebral bodies: Intact. No fracture or dislocation. 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 Generally mild disc space height loss throughout the thoracic and lumbar spine, focally severe at  L5-S1. Paraspinous soft tissues: Unremarkable. IMPRESSION: 1. No CT evidence of acute traumatic injury to the chest, abdomen, or pelvis. 2. Moderate right, small left pleural effusions and associated atelectasis or consolidation as well as interstitial edema. 3. Cardiomegaly and coronary artery disease. 4. 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 Recommend follow-up ultrasound every 2 years if not otherwise imaged and if clinically appropriate. 5. Severe pancolonic diverticulosis without evidence of acute diverticulitis. 6. No fracture or dislocation of the thoracic or lumbar spine. 7. 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 Generally mild disc space height loss throughout the thoracic and lumbar spine, focally severe at L5-S1. Aortic Atherosclerosis (ICD10-I70.0). Electronically Signed   By: Marolyn JONETTA Jaksch M.D.   On: 05/02/2024 17:22   CT T-SPINE NO CHARGE Result Date: 05/02/2024 CLINICAL DATA:  Trauma, fall EXAM: CT CHEST, ABDOMEN, AND PELVIS WITH CONTRAST CT THORACIC AND LUMBAR SPINE WITH CONTRAST TECHNIQUE: Multidetector CT imaging of the chest, abdomen and pelvis was performed following the standard protocol during bolus administration of intravenous contrast. Multidetector CT imaging of the thoracic and lumbar spine was performed following the standard protocol during bolus administration of intravenous contrast. RADIATION DOSE REDUCTION: This exam was performed according to the departmental dose-optimization program which includes automated exposure control, adjustment of the mA and/or kV according to patient size and/or use of iterative reconstruction technique. CONTRAST:  75mL OMNIPAQUE  IOHEXOL  350 MG/ML SOLN COMPARISON:  None Available. FINDINGS: CT CHEST FINDINGS Cardiovascular: Aortic atherosclerosis. Cardiomegaly. Three-vessel coronary artery calcifications. No pericardial effusion. Scattered venous air  insufflation in the right shoulder and lower right neck. Mediastinum/Nodes: No enlarged mediastinal, hilar, or axillary lymph nodes. Thyroid  gland, trachea, and esophagus demonstrate no significant findings. Lungs/Pleura: Moderate right, small left pleural effusions and associated atelectasis or consolidation. Mild diffuse interlobular septal thickening throughout. Mild diffuse bronchial wall thickening. Musculoskeletal: No chest wall mass or suspicious osseous lesions identified. CT ABDOMEN PELVIS FINDINGS Hepatobiliary: No focal liver abnormality is seen. Status post cholecystectomy. No biliary dilatation. Pancreas: Unremarkable. No pancreatic ductal dilatation or surrounding inflammatory changes. Spleen: Normal in size without significant abnormality. Adrenals/Urinary Tract: Adrenal glands are unremarkable. Kidneys are normal, without renal calculi, solid lesion, or hydronephrosis. Bladder is unremarkable. Stomach/Bowel: Stomach is within normal limits. Appendix not clearly visualized. No evidence of bowel wall thickening, distention, or inflammatory changes. Severe pancolonic diverticulosis. Vascular/Lymphatic: Aortic atherosclerosis. 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 No enlarged abdominal or pelvic lymph nodes. Reproductive: No mass or other abnormality. Other: No abdominal wall hernia or abnormality. No ascites. Musculoskeletal: No acute osseous findings. CT THORACIC AND LUMBAR SPINE FINDINGS Alignment: Normal thoracic kyphosis. Normal lumbar lordosis. Vertebral bodies: Intact. No fracture or dislocation. 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 Generally mild disc space height loss throughout the thoracic and lumbar spine, focally severe at L5-S1. Paraspinous soft tissues: Unremarkable. IMPRESSION: 1. No CT evidence of acute traumatic injury to the chest, abdomen, or pelvis. 2. Moderate right, small  left pleural effusions and associated atelectasis or consolidation as well as interstitial edema. 3. Cardiomegaly and coronary artery disease. 4. Aneurysm of the infrarenal  abdominal aorta measuring up to 3.5 x 3.4 cm. Recommend follow-up ultrasound every 2 years if not otherwise imaged and if clinically appropriate. 5. Severe pancolonic diverticulosis without evidence of acute diverticulitis. 6. No fracture or dislocation of the thoracic or lumbar spine. 7. 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 Generally mild disc space height loss throughout the thoracic and lumbar spine, focally severe at L5-S1. Aortic Atherosclerosis (ICD10-I70.0). Electronically Signed   By: Marolyn JONETTA Jaksch M.D.   On: 05/02/2024 17:22   DG Pelvis Portable Result Date: 05/02/2024 CLINICAL DATA:  Trauma, fall. EXAM: PORTABLE PELVIS 1-2 VIEWS COMPARISON:  None Available. FINDINGS: The cortical margins of the bony pelvis are intact. No fracture. Pubic symphysis and sacroiliac joints are congruent. Both femoral heads are well-seated in the respective acetabula. Mild bilateral hip osteoarthritis. IMPRESSION: No pelvic fracture. Electronically Signed   By: Andrea Gasman M.D.   On: 05/02/2024 16:55   DG Chest Port 1 View Result Date: 05/02/2024 CLINICAL DATA:  Trauma, mechanical fall. EXAM: PORTABLE CHEST 1 VIEW COMPARISON:  12/25/2023 FINDINGS: Cardiomegaly is unchanged. Stable mediastinal contours with aortic atherosclerosis. Chronic blunting of right costophrenic angle. No pneumothorax. No confluent consolidation. On limited assessment, no acute osseous findings. IMPRESSION: 1. No acute traumatic findings. 2. Unchanged cardiomegaly. Unchanged blunting of right costophrenic angle. Electronically Signed   By: Andrea Gasman M.D.   On: 05/02/2024 16:54    Assessment & Plan:   Problem List Items Addressed This Visit     Arthritis   MMR and OA. Pt is not taking Prednisone  for  ?reason. I asked him to re-start.      Congestive heart failure (CHF) (HCC) - Primary   CHF. Pt is not taking Farxiga  for ?reason. I asked him to re-start.      Hyperglycemia    Pt is not taking Farxiga  for ?reason. I asked him to re-start.      PMR (polymyalgia rheumatica) (HCC)   Pt is not taking Prednisone  for ?reason. I asked him to re-start.      Shoulder pain, bilateral   In PT         No orders of the defined types were placed in this encounter.     Follow-up: Return in about 2 months (around 08/20/2024) for a follow-up visit.  Marolyn Noel, MD

## 2024-06-20 NOTE — Assessment & Plan Note (Signed)
In PT 

## 2024-06-22 ENCOUNTER — Ambulatory Visit: Payer: Self-pay | Admitting: Internal Medicine

## 2024-06-27 ENCOUNTER — Ambulatory Visit (HOSPITAL_COMMUNITY)
Admission: RE | Admit: 2024-06-27 | Discharge: 2024-06-27 | Disposition: A | Discharge status: Home / Self Care | Source: Ambulatory Visit | Attending: Cardiology | Admitting: Cardiology

## 2024-06-27 DIAGNOSIS — I5020 Unspecified systolic (congestive) heart failure: Secondary | ICD-10-CM | POA: Insufficient documentation

## 2024-06-27 MED ORDER — PERFLUTREN LIPID MICROSPHERE
1.0000 mL | INTRAVENOUS | Status: AC | PRN
  Administered 2024-06-27: 2 mL via INTRAVENOUS

## 2024-06-28 LAB — ECHOCARDIOGRAM COMPLETE
Area-P 1/2: 3.59 cm2
MV M vel: 4.28 m/s
MV Peak grad: 73.3 mmHg
Radius: 0.8 cm
S' Lateral: 5.4 cm

## 2024-07-02 NOTE — Progress Notes (Unsigned)
 Cardiology Office Note   Date:  07/05/2024   ID:  Dustin Clayton, DOB 03/22/1946, MRN [id number]  PCP:  Garald Karlynn GAILS, MD  Cardiologist:   Kaytlin Burklow [place], MD   Chief Complaint  Patient presents with   Congestive Heart Failure   Atrial Fibrillation        History of Present Illness: Dustin Clayton is a 78 y.o. male who is seen for follow up Afib. He was admitted from 9/17-9/22/18. He presented with acute epigastric pain, nausea, fever, and weakness. Initial Ecg showed ? ST elevation in the septal leads and code STEMI was activated. Troponin was mildly elevated. He underwent emergent cardiac cath that showed chronic occlusion of the distal LCX. There was 60-70% ostial first diagonal stenosis. No clear culprit. EF was normal by cath and Echo. It was felt that troponin elevation was due to demand ischemia and that his presentation was not primarily cardiac. Review of Ecg showed really no change compared to older Ecgs. He had elevated LFTs and underwent ERCP with findings of retained CBD stone. He had ERCP with sphincterotomy. LFTs trended down. Surgical consult recommended as outpatient. He also had gastritis and duodenal ulcers without active bleeding- treated with PPI. He has a history of Etoh abuse and abstinence recommended. Statins were held due to elevated LFTs. Thrombocytopenia also noted but improved.   Never required surgery for his gallbladder.  On physical in June 2022 Ecg showed Afib with rate 87 and a LBBB. No bleeding history. No history of CVA. Echo showed LV dysfunction with EF 45-50%. He also had severe LAE. Restoration of NSR was felt to be unlikely given LAE. We recommended Entresto  for his reduce EF but he decided not to take.  In October noted increased arthritic symptoms. He was diagnosed with polymyalgia rheumatica and was started on steroids with improvement in his  symptoms. He has not resumed statin therapy. We also recommended Eliquis  for stroke prophylaxis for  his Afib but he deferred.   He was seen by his PCP on 12/25/2023.  He presented for shortness of breath x 1 week and complaints of chest pain and pressure as well as mild edema.  EKG showed A-fib with HR 103 bpm.  Echocardiogram was ordered which showed LVEF 25-30%, global hypokinesis, left ventricular internal cavity size severely dilated, mild LVH, MR due to annular dilation, moderate to severe mitral valve regurgitation, tricuspid valve regurgitation is mild to moderate.  Chest x-ray showed mild diffuse bilateral interstitial thickening suspicious for mild pulmonary edema with bilateral pleural effusions.  He was started on Eliquis  5 mg twice daily and furosemide  20 mg daily.   He was seen in clinic on 02/01/2024.  The chest pain had resolved however shortness of breath continued but was improving.  Right and left cardiac catheterization was ordered and completed on 02/09/2024 showing single-vessel occlusive CAD with 100% distal LCx, no angiographic change since 2018.  First diagonal lesion 60% stenosed, mildly elevated LV filling pressure with LVEDP 18 mmHg and normal right heart pressures.  Plan was to optimize medical therapy.   Last seen in clinic on 02/24/2024.  [name] without chest pains.  He noted ongoing DOE was unchanged.  His metoprolol  tartrate was transition to metoprolol  XL 12.5 mg daily.  His amlodipine  was discontinued and he was started on losartan  12.5 mg daily.  Changes were made in order to optimize current GDMT.    He did have a fall down his steps on June 30. Had a  scalp laceration. Went to ED. Multiple scans without fracture. Some pleural effusions noted.   Recent Echo showed no change in severe LV dysfunction with EF 25-30%. Severe biatrial enlargement. Severe functional MR. Mild pulmonary HTN. Mild AI.   He reports he does get SOB going up stairs. No edema. Compared to last year he has lost about 25 lbs. No orthopnea or PND. No palpitations. Still drinking 2 glasses of  wine daily.    Past Medical History:  Diagnosis Date   Acute cholangitis    Alcohol abuse    Arrhythmia    CAD (coronary artery disease), native coronary artery    Common bile duct calculus    Duodenal ulcer    Gastritis    HTN (hypertension)    Hypercholesterolemia    Thrombocytopenia (HCC)     Past Surgical History:  Procedure Laterality Date   APPENDECTOMY     ERCP N/A 07/24/2017   Procedure: ENDOSCOPIC RETROGRADE CHOLANGIOPANCREATOGRAPHY (ERCP);  Surgeon: Rosalie Kitchens, MD;  Location: [facility] ENDOSCOPY;  Service: Endoscopy;  Laterality: N/A;   EYE SURGERY     LEFT HEART CATH AND CORONARY ANGIOGRAPHY N/A 07/20/2017   Procedure: LEFT HEART CATH AND CORONARY ANGIOGRAPHY;  Surgeon: Verlin Lonni BIRCH, MD;  Location: [facility] INVASIVE CV LAB;  Service: Cardiovascular;  Laterality: N/A;   RIGHT/LEFT HEART CATH AND CORONARY ANGIOGRAPHY N/A 02/09/2024   Procedure: RIGHT/LEFT HEART CATH AND CORONARY ANGIOGRAPHY;  Surgeon: [place], Darene Nappi M, MD;  Location: [facility] INVASIVE CV LAB;  Service: Cardiovascular;  Laterality: N/A;   TONSILLECTOMY       Current Outpatient Medications  Medication Sig Dispense Refill   apixaban  (ELIQUIS ) 5 MG TABS tablet Take 1 tablet (5 mg total) by mouth 2 (two) times daily. 60 tablet 11   atorvastatin  (LIPITOR ) 20 MG tablet Take 1 tablet (20 mg total) by mouth daily. 90 tablet 2   brimonidine (ALPHAGAN P) 0.1 % SOLN Place into the left eye daily.     dapagliflozin  propanediol (FARXIGA ) 10 MG TABS tablet Take 1 tablet (10 mg total) by mouth daily. 90 tablet 1   dorzolamide-timolol (COSOPT) 22.3-6.8 MG/ML ophthalmic solution Place 1 drop into the left eye 2 (two) times daily.     losartan  (COZAAR ) 25 MG tablet Take 1 tablet (25 mg total) by mouth daily. 90 tablet 3   metoprolol  succinate (TOPROL -XL) 25 MG 24 hr tablet Take 0.5 tablets (12.5 mg total) by mouth daily. 45 tablet 2   mupirocin  ointment (BACTROBAN ) 2 % On leg wound w/dressing change qd or bid 30 g 0   prednisoLONE  acetate (PRED FORTE) 1 % ophthalmic suspension Place 1 drop into the left eye daily at 6 (six) AM.     predniSONE  (DELTASONE ) 5 MG tablet Take 1 tablet (5 mg total) by mouth daily with breakfast. 90 tablet 1   No current facility-administered medications for this visit.    Allergies:   Other    Social History:  The patient  reports that he quit smoking about 39 years ago. His smoking use included cigarettes. He has been exposed to tobacco smoke. He has never used smokeless tobacco. He reports current alcohol use of about 15.0 standard drinks of alcohol per week. He reports that he does not use drugs.   Family History:  The patient's family history includes Cancer in his mother; Prostate cancer in his brother.    ROS:  Please see the history of present illness.   Otherwise, review of systems are positive for none.   All  other systems are reviewed and negative.    PHYSICAL EXAM: VS:  BP 110/66 (BP Location: Left Arm, Patient Position: Sitting, Cuff Size: Normal)   Pulse 87   Ht 5' 11 (1.803 m)   Wt 194 lb 9.6 oz (88.3 kg)   SpO2 98%   BMI 27.14 kg/m  , BMI Body mass index is 27.14 kg/m. GEN: Well nourished, well developed, in no acute distress  HEENT: normal  Neck: no JVD, carotid bruits, or masses Cardiac: IRRR; gr 2/6 systolic murmur at apex. no rubs, or gallops,no edema  Respiratory:  clear to auscultation bilaterally, normal work of breathing GI: soft, nontender, nondistended, + BS MS: no deformity or atrophy  Skin: warm and dry, no rash Neuro:  Strength and sensation are intact Psych: euthymic mood, full affect   EKG:  EKG is not ordered today. Afib with rate 79. LBBB. I have personally reviewed and interpreted this study.   Recent Labs: 11/18/2023: TSH 4.20 06/20/2024: ALT 9; BUN 11; Creatinine, Ser 0.72; Hemoglobin 13.5; Platelets 133.0; Potassium 4.4; Sodium 140    Lipid Panel    Component Value Date/Time   CHOL 180 07/30/2022 1202   TRIG 141.0 07/30/2022 1202    HDL 85.80 07/30/2022 1202   CHOLHDL 2 07/30/2022 1202   VLDL 28.2 07/30/2022 1202   LDLCALC 66 07/30/2022 1202    Labs dated 04/11/21: cholesterol 224, triglycerides 82, LDL 128, HDL 80. CMET and CBC normal. Dated 07/18/21: cholesterol 143, triglycerides 97, HDL 44, LDL 99.   Wt Readings from Last 3 Encounters:  07/05/24 194 lb 9.6 oz (88.3 kg)  06/20/24 194 lb (88 kg)  05/12/24 196 lb 6 oz (89.1 kg)      Other studies Reviewed: Additional studies/ records that were reviewed today include:   Echo: 07/21/17: Study Conclusions   - Left ventricle: The cavity size was normal. There was moderate   concentric hypertrophy. Systolic function was normal. The   estimated ejection fraction was in the range of 55% to 60%. Wall   motion was normal; there were no regional wall motion   abnormalities. Doppler parameters are consistent with abnormal   left ventricular relaxation (grade 1 diastolic dysfunction). - Left atrium: The atrium was mildly dilated. - Right ventricle: The cavity size was mildly dilated. - Atrial septum: No defect or patent foramen ovale was identified.  Cardiac cath: 07/20/17: Procedures   LEFT HEART CATH AND CORONARY ANGIOGRAPHY  Conclusion     Mid Cx to Dist Cx lesion, 100 %stenosed. Mid RCA lesion, 20 %stenosed. Prox Cx lesion, 20 %stenosed. Ost 1st Diag to 1st Diag lesion, 60 %stenosed. Prox LAD lesion, 20 %stenosed. The left ventricular systolic function is normal. LV end diastolic pressure is normal. The left ventricular ejection fraction is greater than 65% by visual estimate. There is no mitral valve regurgitation.   1. There is right to left filling of a small lateral wall branch. This appears to be a total occlusion of the distal Circumflex where the vessel becomes very small in caliber but cannot totally exclude this being a small Diagonal branch. The distal AV groove Circumflex changes quickly to a small caliber vessel with a lateral branch. This could  the site of total occlusion. The vascular territory supplied by this branch is very small.  2. Moderate stenosis in the ostium of the large first Diagonal branch with excellent flow down the vessel. This vessel represents a dual LAD. This lesion does not appear to be ulcerated and doe not appear  to be flow limiting.  3. Mild stenosis in the proximal LAD. The mid LAD is tortuous and there is subtle dye staining in this bend with quick dye washout. The LAD continues to the apex.  4. The RCA is a large dominant vessel with mild plaque. The distal right gives off a collateral to a very small lateral wall vessel.  5. Normal LV systolic function 6. 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 As above, LBBB induced with the placement of the pigtail catheter in the LV. Echo in am. It is unclear if his presentation is due to the occlusion of the small lateral wall branch. He is at the completion of the cath not having any symptoms.      Echo 07/11/21: IMPRESSIONS     1. Left ventricular ejection fraction, by estimation, is 45 to 50%. The  left ventricle has mildly decreased function. The left ventricle  demonstrates global hypokinesis. There is mild asymmetric left ventricular  hypertrophy of the posterior segment.  Left ventricular diastolic function could not be evaluated.   2. Right ventricular systolic function is normal. The right ventricular  size is moderately enlarged.   3. Left atrial size was severely dilated.   4. Right atrial size was mildly dilated.   5. The pericardial effusion is anterior to the right ventricle.   6. The mitral valve is normal in structure. Trivial mitral valve  regurgitation. No evidence of mitral stenosis.   7. The aortic valve is tricuspid. Aortic valve regurgitation is not  visualized. Mild aortic valve sclerosis is present, with no evidence of  aortic valve  stenosis.   8. Aortic dilatation noted. There is mild dilatation of the aortic root,  measuring 38 mm.   9. The inferior vena cava is normal in size with greater than 50%  respiratory variability  Echo 01/11/24: IMPRESSIONS     1. Compared to echo from Sept 2022, LVEF is now severely depressed and  mitral regurgitation is more pronounced.   2. Left ventricular ejection fraction, by estimation, is 25 to 30%. The  left ventricle has severely decreased function. The left ventricle  demonstrates global hypokinesis. The left ventricular internal cavity size  was severely dilated. There is mild  concentric left ventricular hypertrophy. Left ventricular diastolic  parameters are indeterminate.   3. Right ventricular systolic function is normal. The right ventricular  size is normal.   4. Left atrial size was severely dilated.   5. MR due to annular dilitation. . The mitral valve is normal in  structure. Moderate to severe mitral valve regurgitation.   6. Tricuspid valve regurgitation is mild to moderate.   7. The aortic valve is tricuspid. Aortic valve regurgitation is not  visualized. Aortic valve sclerosis/calcification is present, without any  evidence of aortic stenosis.   8. Right atrial size was moderately dilated.   Cardiac cath 02/09/24:  RIGHT/LEFT HEART CATH AND CORONARY ANGIOGRAPHY   Conclusion      1st Diag lesion is 60% stenosed.   Mid Cx lesion is 100% stenosed.   LV end diastolic pressure is mildly elevated.   Single vessel occlusive CAD. 100% distal LCx. No angiographic change since 2018 Mildly elevated LV filling pressure. LVEDP 18 mm Hg. PCWP 20/19, mean 18 mm Hg. V waves are not prominent Normal right heart pressures. PAP 31/12, mean 19 mm Hg.  Cardiac output 4.64 L/min, index 2.24  Plan: optimize medical therapy.    Echo 06/27/24: IMPRESSIONS     1. Left ventricular ejection fraction, by estimation, is 25 to 30%. The  left ventricle has severely decreased  function. The left ventricle  demonstrates global hypokinesis. The left ventricular internal cavity size  was moderately to severely dilated.  Left ventricular diastolic parameters were normal.   2. Right ventricular systolic function is normal. The right ventricular  size is normal. There is mildly elevated pulmonary artery systolic  pressure.   3. Left atrial size was severely dilated.   4. Right atrial size was severely dilated.   5. Severe functional MR due to atrial/LV enlargement . The mitral valve  is abnormal. Severe mitral valve regurgitation. No evidence of mitral  stenosis.   6. Tricuspid valve regurgitation is moderate.   7. The aortic valve is tricuspid. There is moderate calcification of the  aortic valve. There is moderate thickening of the aortic valve. Aortic  valve regurgitation is trivial. Aortic valve sclerosis is present, with no  evidence of aortic valve stenosis.   8. The inferior vena cava is normal in size with greater than 50%  respiratory variability, suggesting right atrial pressure of 3 mmHg.    ASSESSMENT AND PLAN:  1.  CAD. Cardiac cath in 2018 showed moderate ostial diagonal disease and chronic occlusion of a small distal LCx vessel. He has no angina. Repeat cath in April 2025 showed no change in anatomy. Continue beta blocker 2. Hypercholesterolemia. Now on lipitor  20 mg daily 3. HTN - well controlled 4. Cholangitis due to retained CBD stone. S/p ERCP with resolution of symptoms. 5. Atrial fibrillation persistent. Rate controlled on metoprolol  and his is not symptomatic. Will manage with rate control alone. ITALY vasc score is 3. Continue Eliquis . With his severe biatrial enlargement and MR there is no chance of restoring normal rhythm. 6. Chronic systolic CHF. Dropped significantly this year. Now 25-30%. Severe functional MR.  On metoprolol  and losartan . Will increase losartan  to 25 mg daily. Continue Farxiga . No evidence of volume overload. Stressed  importance of sodium restriction. Also recommended complete abstinence from alcohol.    Follow up in one year

## 2024-07-05 ENCOUNTER — Ambulatory Visit: Attending: Cardiology | Admitting: Cardiology

## 2024-07-05 ENCOUNTER — Encounter: Payer: Self-pay | Admitting: Cardiology

## 2024-07-05 VITALS — BP 110/66 | HR 87 | Ht 71.0 in | Wt 194.6 lb

## 2024-07-05 DIAGNOSIS — I340 Nonrheumatic mitral (valve) insufficiency: Secondary | ICD-10-CM | POA: Insufficient documentation

## 2024-07-05 DIAGNOSIS — I447 Left bundle-branch block, unspecified: Secondary | ICD-10-CM | POA: Diagnosis not present

## 2024-07-05 DIAGNOSIS — I10 Essential (primary) hypertension: Secondary | ICD-10-CM | POA: Diagnosis not present

## 2024-07-05 DIAGNOSIS — I4819 Other persistent atrial fibrillation: Secondary | ICD-10-CM | POA: Insufficient documentation

## 2024-07-05 DIAGNOSIS — I5042 Chronic combined systolic (congestive) and diastolic (congestive) heart failure: Secondary | ICD-10-CM | POA: Insufficient documentation

## 2024-07-05 MED ORDER — LOSARTAN POTASSIUM 25 MG PO TABS: 25.0000 mg | ORAL_TABLET | Freq: Every day | ORAL | 3 refills | Status: AC

## 2024-07-05 NOTE — Patient Instructions (Addendum)
 Medication Instructions:  Increase Losartan  to 25 mg one tablet daily Continue all other medications *If you need a refill on your cardiac medications before your next appointment, please call your pharmacy*  Lab Work: None ordered  Testing/Procedures: None ordered  Follow-Up: At [facility], you and your health needs are our priority.  As part of our continuing mission to provide you with exceptional heart care, our providers are all part of one team.  This team includes your primary Cardiologist (physician) and Advanced Practice Providers or APPs (Physician Assistants and Nurse Practitioners) who all work together to provide you with the care you need, when you need it.  Your next appointment:  6 months   Call in Nov to schedule March appointment     Provider:  Lum Louis NP   We recommend signing up for the patient portal called MyChart.  Sign up information is provided on this After Visit Summary.  MyChart is used to connect with patients for Virtual Visits (Telemedicine).  Patients are able to view lab/test results, encounter notes, upcoming appointments, etc.  Non-urgent messages can be sent to your provider as well.   To learn more about what you can do with MyChart, go to [email or web address].

## 2024-07-06 ENCOUNTER — Ambulatory Visit: Payer: Self-pay | Admitting: Emergency Medicine

## 2024-09-25 ENCOUNTER — Other Ambulatory Visit: Payer: Self-pay | Admitting: Emergency Medicine

## 2024-10-21 ENCOUNTER — Other Ambulatory Visit: Payer: Self-pay | Admitting: Cardiology

## 2024-11-18 ENCOUNTER — Other Ambulatory Visit: Payer: Self-pay | Admitting: Emergency Medicine

## 2025-02-09 ENCOUNTER — Ambulatory Visit
